# Patient Record
Sex: Female | Born: 1985 | Race: Black or African American | Hispanic: No | Marital: Single | Smoking: Never smoker
Health system: Southern US, Community
[De-identification: ages and names within clinical notes are randomized; demographics above are authoritative.]

## PROBLEM LIST (undated history)

## (undated) DIAGNOSIS — E0590 Thyrotoxicosis, unspecified without thyrotoxic crisis or storm: Secondary | ICD-10-CM

## (undated) DIAGNOSIS — T4145XA Adverse effect of unspecified anesthetic, initial encounter: Secondary | ICD-10-CM

## (undated) DIAGNOSIS — E119 Type 2 diabetes mellitus without complications: Secondary | ICD-10-CM

## (undated) DIAGNOSIS — R7303 Prediabetes: Secondary | ICD-10-CM

## (undated) DIAGNOSIS — D649 Anemia, unspecified: Secondary | ICD-10-CM

## (undated) DIAGNOSIS — T8859XA Other complications of anesthesia, initial encounter: Secondary | ICD-10-CM

## (undated) DIAGNOSIS — K219 Gastro-esophageal reflux disease without esophagitis: Secondary | ICD-10-CM

## (undated) DIAGNOSIS — R87619 Unspecified abnormal cytological findings in specimens from cervix uteri: Secondary | ICD-10-CM

## (undated) DIAGNOSIS — F419 Anxiety disorder, unspecified: Secondary | ICD-10-CM

## (undated) HISTORY — DX: Prediabetes: R73.03

## (undated) HISTORY — PX: TUBAL LIGATION: SHX77

## (undated) HISTORY — PX: CHOLECYSTECTOMY: SHX55

## (undated) HISTORY — PX: ROUX-EN-Y GASTRIC BYPASS: SHX1104

## (undated) HISTORY — PX: TONSILLECTOMY: SUR1361

## (undated) HISTORY — PX: BARIATRIC SURGERY: SHX1103

## (undated) HISTORY — PX: UPPER GASTROINTESTINAL ENDOSCOPY: SHX188

## (undated) HISTORY — DX: Unspecified abnormal cytological findings in specimens from cervix uteri: R87.619

## (undated) HISTORY — PX: PANNICULECTOMY: SUR1001

---

## 2015-04-07 ENCOUNTER — Ambulatory Visit: Payer: Self-pay | Admitting: Family Medicine

## 2015-04-19 ENCOUNTER — Ambulatory Visit: Payer: Self-pay | Admitting: Family Medicine

## 2015-04-28 ENCOUNTER — Ambulatory Visit: Payer: Self-pay | Admitting: Family Medicine

## 2015-09-04 ENCOUNTER — Ambulatory Visit: Payer: Self-pay | Admitting: Family Medicine

## 2016-04-09 ENCOUNTER — Encounter: Payer: Self-pay | Admitting: *Deleted

## 2016-04-10 ENCOUNTER — Ambulatory Visit
Admission: RE | Admit: 2016-04-10 | Payer: Medicaid Other | Source: Ambulatory Visit | Admitting: Unknown Physician Specialty

## 2016-04-10 ENCOUNTER — Encounter: Admission: RE | Payer: Self-pay | Source: Ambulatory Visit

## 2016-04-10 HISTORY — DX: Anxiety disorder, unspecified: F41.9

## 2016-04-10 HISTORY — DX: Gastro-esophageal reflux disease without esophagitis: K21.9

## 2016-04-10 HISTORY — DX: Type 2 diabetes mellitus without complications: E11.9

## 2016-04-10 SURGERY — ESOPHAGOGASTRODUODENOSCOPY (EGD) WITH PROPOFOL
Anesthesia: General

## 2016-05-01 DIAGNOSIS — R87619 Unspecified abnormal cytological findings in specimens from cervix uteri: Secondary | ICD-10-CM

## 2016-05-01 HISTORY — DX: Unspecified abnormal cytological findings in specimens from cervix uteri: R87.619

## 2016-05-01 LAB — HM PAP SMEAR

## 2016-06-18 HISTORY — PX: COLPOSCOPY: SHX161

## 2016-06-19 DIAGNOSIS — Z87410 Personal history of cervical dysplasia: Secondary | ICD-10-CM | POA: Insufficient documentation

## 2016-08-05 DIAGNOSIS — R7303 Prediabetes: Secondary | ICD-10-CM | POA: Insufficient documentation

## 2016-08-05 DIAGNOSIS — K219 Gastro-esophageal reflux disease without esophagitis: Secondary | ICD-10-CM | POA: Insufficient documentation

## 2016-08-05 DIAGNOSIS — R030 Elevated blood-pressure reading, without diagnosis of hypertension: Secondary | ICD-10-CM | POA: Insufficient documentation

## 2016-08-28 ENCOUNTER — Ambulatory Visit
Admission: RE | Admit: 2016-08-28 | Payer: Medicaid Other | Source: Ambulatory Visit | Admitting: Unknown Physician Specialty

## 2016-08-28 ENCOUNTER — Encounter: Admission: RE | Payer: Self-pay | Source: Ambulatory Visit

## 2016-08-28 SURGERY — ESOPHAGOGASTRODUODENOSCOPY (EGD) WITH PROPOFOL
Anesthesia: General

## 2016-09-05 DIAGNOSIS — D509 Iron deficiency anemia, unspecified: Secondary | ICD-10-CM | POA: Insufficient documentation

## 2017-01-09 ENCOUNTER — Ambulatory Visit (INDEPENDENT_AMBULATORY_CARE_PROVIDER_SITE_OTHER): Payer: Medicaid Other

## 2017-01-09 DIAGNOSIS — Z3042 Encounter for surveillance of injectable contraceptive: Secondary | ICD-10-CM

## 2017-01-09 DIAGNOSIS — Z308 Encounter for other contraceptive management: Secondary | ICD-10-CM

## 2017-01-09 MED ORDER — MEDROXYPROGESTERONE ACETATE 150 MG/ML IM SUSP
150.0000 mg | Freq: Once | INTRAMUSCULAR | Status: AC
  Administered 2017-01-09: 150 mg via INTRAMUSCULAR

## 2017-01-22 ENCOUNTER — Encounter: Payer: Self-pay | Admitting: Obstetrics & Gynecology

## 2017-01-28 ENCOUNTER — Ambulatory Visit: Payer: Medicaid Other | Admitting: Obstetrics & Gynecology

## 2017-01-29 DIAGNOSIS — N870 Mild cervical dysplasia: Secondary | ICD-10-CM

## 2017-01-30 ENCOUNTER — Ambulatory Visit (INDEPENDENT_AMBULATORY_CARE_PROVIDER_SITE_OTHER): Payer: Medicaid Other | Admitting: Obstetrics and Gynecology

## 2017-01-30 ENCOUNTER — Encounter: Payer: Self-pay | Admitting: Obstetrics and Gynecology

## 2017-01-30 VITALS — BP 120/70 | HR 86 | Ht 67.0 in | Wt 255.0 lb

## 2017-01-30 DIAGNOSIS — N938 Other specified abnormal uterine and vaginal bleeding: Secondary | ICD-10-CM

## 2017-01-30 DIAGNOSIS — R5383 Other fatigue: Secondary | ICD-10-CM | POA: Diagnosis not present

## 2017-01-30 DIAGNOSIS — R87612 Low grade squamous intraepithelial lesion on cytologic smear of cervix (LGSIL): Secondary | ICD-10-CM

## 2017-01-30 DIAGNOSIS — R202 Paresthesia of skin: Secondary | ICD-10-CM | POA: Diagnosis not present

## 2017-01-30 DIAGNOSIS — N921 Excessive and frequent menstruation with irregular cycle: Secondary | ICD-10-CM

## 2017-01-30 DIAGNOSIS — Z0000 Encounter for general adult medical examination without abnormal findings: Secondary | ICD-10-CM | POA: Diagnosis not present

## 2017-01-30 NOTE — Progress Notes (Signed)
  Chief Complaint  Patient presents with  . aub     HPI:      Crystal Mendez is a 31 y.o. [phone] who LMP was No LMP recorded., presents today for DUB/menometrorrhagia sx. She is a pt of Dr. Tiburcio Pea' but he isn't in the office.  She has a long hx of menometrorrhagia and BTB since her BTL years ago. She has been seeing Dr. Tiburcio Pea since 8/17. 11/17 U/S suggestive of adenomyosis per Dr. Tiburcio Pea note. Pt started on depo. She has had 2 shots. She complains of regular BTB or light bleeding, even on depo, as well as cramping, increased chest hair. She is very tired of bleeding and is interested in a hysterectomy. She does not want an IUD.   She also complains of tingling in her legs and being very cold at night. She has not had recent labs.   Past Medical History:  Diagnosis Date  . Abnormal Pap smear of cervix 05/01/2016  . Anxiety   . Asthma   . Diabetes mellitus without complication (HCC)   . GERD (gastroesophageal reflux disease)     Past Surgical History:  Procedure Laterality Date  . CESAREAN SECTION    . CHOLECYSTECTOMY    . COLPOSCOPY  06/18/2016  . TONSILLECTOMY    . TUBAL LIGATION    . TUBAL LIGATION      No family history on file.   ROS:  Review of Systems  Constitutional: Negative for fever, malaise/fatigue and weight loss.  Gastrointestinal: Negative for blood in stool, constipation, diarrhea, nausea and vomiting.  Genitourinary: Negative for dysuria, flank pain, frequency, hematuria and urgency.  Musculoskeletal: Positive for joint pain. Negative for back pain.  Skin: Negative for itching and rash.  Neurological: Positive for tingling and headaches.    Objective: BP 120/70   Pulse 86   Ht  (1.702 m)   Wt 255 lb (115.7 kg)   BMI 39.94 kg/m    OBGyn Exam   DEFERRED SINCE DONE 11/17.    Assessment/Plan:  Menometrorrhagia - U/S suggestive of adenomyosis 2017. Pt on depo but with BTB. Unhappy with sx. Doesn't want IUD. F/u with Dr. Tiburcio Pea to  discuss hysterectomy.  - Plan: Comprehensive metabolic panel, TSH + free T4, CBC  DUB (dysfunctional uterine bleeding) - Persists on depo. Pt has only had 2 shots and discussed that this is normal, but pt tired of it regardless.  Fatigue, unspecified type - Plan: TSH + free T4  Paresthesia of bilateral legs - Plan: B12 and Folate Panel  Blood tests for routine general physical examination - Check labs. Will f/u with results.  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 Pt due for repeat pap with Dr. Tiburcio Pea. Discussed pros/cons of supracx hyst and pt to discuss with [facility].    F/U  Return in about 1 week (around 02/06/2017) for with Dr. Tiburcio Pea for hyst conf.  Emilina Smarr B. Sokhna Christoph, PA-C 01/30/2017 10:45 AM

## 2017-01-31 LAB — COMPREHENSIVE METABOLIC PANEL
A/G RATIO: 1.3 (ref 1.2–2.2)
ALBUMIN: 4 g/dL (ref 3.5–5.5)
ALK PHOS: 72 IU/L (ref 39–117)
ALT: 13 IU/L (ref 0–32)
AST: 13 IU/L (ref 0–40)
BILIRUBIN TOTAL: 0.2 mg/dL (ref 0.0–1.2)
BUN/Creatinine Ratio: 13 (ref 9–23)
BUN: 8 mg/dL (ref 6–20)
CHLORIDE: 104 mmol/L (ref 96–106)
CO2: 20 mmol/L (ref 18–29)
Calcium: 9.5 mg/dL (ref 8.7–10.2)
Creatinine, Ser: 0.6 mg/dL (ref 0.57–1.00)
GFR calc non Af Amer: 124 mL/min/{1.73_m2} (ref >59)
GFR, EST AFRICAN AMERICAN: 142 mL/min/{1.73_m2} (ref >59)
GLUCOSE: 116 mg/dL — AB (ref 65–99)
Globulin, Total: 3.2 g/dL (ref 1.5–4.5)
Potassium: 4.3 mmol/L (ref 3.5–5.2)
SODIUM: 143 mmol/L (ref 134–144)
TOTAL PROTEIN: 7.2 g/dL (ref 6.0–8.5)

## 2017-01-31 LAB — CBC
HEMOGLOBIN: 11 g/dL — AB (ref 11.1–15.9)
Hematocrit: 36.1 % (ref 34.0–46.6)
MCH: 23.1 pg — ABNORMAL LOW (ref 26.6–33.0)
MCHC: 30.5 g/dL — ABNORMAL LOW (ref 31.5–35.7)
MCV: 76 fL — AB (ref 79–97)
PLATELETS: 440 10*3/uL — AB (ref 150–379)
RBC: 4.77 x10E6/uL (ref 3.77–5.28)
RDW: 16.5 % — ABNORMAL HIGH (ref 12.3–15.4)
WBC: 7.7 10*3/uL (ref 3.4–10.8)

## 2017-01-31 LAB — B12 AND FOLATE PANEL
FOLATE: 13.3 ng/mL (ref >3.0)
Vitamin B-12: 259 pg/mL (ref 232–1245)

## 2017-01-31 LAB — HEMOGLOBIN A1C
Est. average glucose Bld gHb Est-mCnc: 128 mg/dL
Hgb A1c MFr Bld: 6.1 % — ABNORMAL HIGH (ref 4.8–5.6)

## 2017-01-31 LAB — TSH+FREE T4
Free T4: 1.41 ng/dL (ref 0.82–1.77)
TSH: 0.203 u[IU]/mL — AB (ref 0.450–4.500)

## 2017-02-03 NOTE — Progress Notes (Signed)
I routed labs to you. Can you print them and mail to pt? Just want to make sure you get it. Thx.

## 2017-02-21 ENCOUNTER — Ambulatory Visit: Payer: Medicaid Other | Admitting: Obstetrics & Gynecology

## 2017-02-24 ENCOUNTER — Ambulatory Visit: Payer: Medicaid Other | Admitting: Obstetrics & Gynecology

## 2017-03-03 ENCOUNTER — Encounter: Payer: Self-pay | Admitting: Obstetrics & Gynecology

## 2017-03-03 ENCOUNTER — Ambulatory Visit (INDEPENDENT_AMBULATORY_CARE_PROVIDER_SITE_OTHER): Payer: Medicaid Other | Admitting: Obstetrics & Gynecology

## 2017-03-03 VITALS — BP 130/80 | HR 93 | Ht 67.0 in | Wt 256.0 lb

## 2017-03-03 DIAGNOSIS — E119 Type 2 diabetes mellitus without complications: Secondary | ICD-10-CM

## 2017-03-03 DIAGNOSIS — Z87410 Personal history of cervical dysplasia: Secondary | ICD-10-CM

## 2017-03-03 DIAGNOSIS — N921 Excessive and frequent menstruation with irregular cycle: Secondary | ICD-10-CM

## 2017-03-03 NOTE — Patient Instructions (Signed)
Total Laparoscopic Hysterectomy °A total laparoscopic hysterectomy is a minimally invasive surgery to remove your uterus and cervix. This surgery is performed by making several small cuts (incisions) in your abdomen. It can also be done with a thin, lighted tube (laparoscope) inserted into two small incisions in your lower abdomen. Your fallopian tubes and ovaries can be removed (bilateral salpingo-oophorectomy) during this surgery as well. Benefits of minimally invasive surgery include: °· Less pain. °· Less risk of blood loss. °· Less risk of infection. °· Quicker return to normal activities. ° °Tell a health care provider about: °· Any allergies you have. °· All medicines you are taking, including vitamins, herbs, eye drops, creams, and over-the-counter medicines. °· Any problems you or family members have had with anesthetic medicines. °· Any blood disorders you have. °· Any surgeries you have had. °· Any medical conditions you have. °What are the risks? °Generally, this is a safe procedure. However, as with any procedure, complications can occur. Possible complications include: °· Bleeding. °· Blood clots in the legs or lung. °· Infection. °· Injury to surrounding organs. °· Problems with anesthesia. °· Early menopause symptoms (hot flashes, night sweats, insomnia). °· Risk of conversion to an open abdominal incision. ° °What happens before the procedure? °· Ask your health care provider about changing or stopping your regular medicines. °· Do not take aspirin or blood thinners (anticoagulants) for 1 week before the surgery or as told by your health care provider. °· Do not eat or drink anything for 8 hours before the surgery or as told by your health care provider. °· Quit smoking if you smoke. °· Arrange for a ride home after surgery and for someone to help you at home during recovery. °What happens during the procedure? °· You will be given antibiotic medicine. °· An IV tube will be placed in your arm. You  will be given medicine to make you sleep (general anesthetic). °· A gas (carbon dioxide) will be used to inflate your abdomen. This will allow your surgeon to look inside your abdomen, perform your surgery, and treat any other problems found if necessary. °· Three or four small incisions (often less than 1/2 inch) will be made in your abdomen. One of these incisions will be made in the area of your belly button (navel). The laparoscope will be inserted into the incision. Your surgeon will look through the laparoscope while doing your procedure. °· Other surgical instruments will be inserted through the other incisions. °· Your uterus may be removed through your vagina or cut into small pieces and removed through the small incisions. °· Your incisions will be closed. °What happens after the procedure? °· The gas will be released from inside your abdomen. °· You will be taken to the recovery area where a nurse will watch and check your progress. Once you are awake, stable, and taking fluids well, without other problems, you will return to your room or be allowed to go home. °· There is usually minimal discomfort following the surgery because the incisions are so small. °· You will be given pain medicine while you are in the hospital and for when you go home. °This information is not intended to replace advice given to you by your health care provider. Make sure you discuss any questions you have with your health care provider. °Document Released: 08/04/2007 Document Revised: 03/14/2016 Document Reviewed: 04/27/2013 °Elsevier Interactive Patient Education © 2017 [employer] ° °

## 2017-03-03 NOTE — Progress Notes (Signed)
HPI:  Patient is a 31 y.o. [id number] presenting for follow up evaluation of abnormal PAP smear in the past.  Her last PAP was 7 months ago and was abnormal: LGSIL. She has had a prior colposcopy. Prior biopsies (if done) were CIN I.  Also c/o continues abnormal bleeding, with some blood or brown discharge most days each month, despite Depo therapy for the past 7+ mos.  Prior US with adenomyosis appearance.  Prior BTL, does not want future fertility.  PMHx: She  has a past medical history of Abnormal Pap smear of cervix (05/01/2016); Anxiety; Asthma; Diabetes mellitus without complication (HCC); and GERD (gastroesophageal reflux disease). Also,  has a past surgical history that includes Cholecystectomy; Tubal ligation; Tonsillectomy; Tubal ligation; Cesarean section; and Colposcopy (06/18/2016)., family history is not on file.,  reports that she has never smoked. She has never used smokeless tobacco. She reports that she does not drink alcohol or use drugs.  She has a current medication list which includes the following prescription(s): azithromycin, chlorophyll, medroxyprogesterone acetate, multivitamin, ranitidine, and tamiflu. Also, is allergic to eggs or egg-derived products and lactose. Review of Systems  Constitutional: Negative for chills, fever and malaise/fatigue.  HENT: Negative for congestion, sinus pain and sore throat.   Eyes: Negative for blurred vision and pain.  Respiratory: Negative for cough and wheezing.   Cardiovascular: Negative for chest pain and leg swelling.  Gastrointestinal: Negative for abdominal pain, constipation, diarrhea, heartburn, nausea and vomiting.  Genitourinary: Negative for dysuria, frequency, hematuria and urgency.  Musculoskeletal: Negative for back pain, joint pain, myalgias and neck pain.  Skin: Negative for itching and rash.  Neurological: Negative for dizziness, tremors and weakness.  Endo/Heme/Allergies: Does not bruise/bleed easily.    Psychiatric/Behavioral: Negative for depression. The patient is not nervous/anxious and does not have insomnia.     Objective: BP 130/80   Pulse 93   Ht 5\' 7"  (1.702 m)   Wt 256 lb (116.1 kg)   BMI 40.10 kg/m  Filed Weights   03/03/17 1109  Weight: 256 lb (116.1 kg)   Body mass index is 40.1 kg/m.  Physical examination Physical Exam  Constitutional: She is oriented to person, place, and time. She appears well-developed and well-nourished. No distress.  Genitourinary: Vagina normal. Pelvic exam was performed with patient supine. There is no rash, tenderness or lesion on the right labia. There is no rash, tenderness or lesion on the left labia. No erythema or bleeding in the vagina. Right adnexum does not display mass and does not display tenderness. Left adnexum does not display mass and does not display tenderness. Cervix does not exhibit motion tenderness, discharge, polyp or nabothian cyst.   Uterus is enlarged, mobile and midaxial. Uterus is not exhibiting a mass.  Abdominal: Soft. She exhibits no distension. There is no tenderness.  Musculoskeletal: Normal range of motion.  Neurological: She is alert and oriented to person, place, and time. No cranial nerve deficit.  Skin: Skin is warm and dry.  Psychiatric: She has a normal mood and affect.    ASSESSMENT:  History of Cervical Dysplasia, Menometrorrhagia, Adenomyosis Also, obesity, diabetes.  Plan:  1.  I discussed the grading system of pap smears and HPV high risk viral types.   2. Follow up PAP 6 months, vs intervention if high grade dysplasia identified. 3. Treatment of persistantly abnormal PAP smears and cervical dysplasia, even mild, is discussed w pt today in detail, as well as the pros and cons of Cryo and LEEP procedures. Will consider and  discuss after results. 4. Plan hysterectomy per pt choice after all options for menometrorrhagia tx discussed.  Prior CS x4 as risk discussed.  Obesity risk factor  discussed. Patient has abnormal uterine bleeding . Treatment option for menorrhagia or menometrorrhagia discussed in great detail with the patient.  Options include hormonal therapy, IUD therapy such as Mirena, D&C, Ablation, and Hysterectomy.  The pros and cons of each option discussed with patient. Prefers hysterectomy and declines IUD, continued Depo, pill, ablation.  Annamarie MajorPaul Bernise Sylvain, MD, Merlinda FrederickFACOG [facility], [facility] 03/03/2017  11:41 AM

## 2017-03-04 ENCOUNTER — Ambulatory Visit: Payer: Self-pay | Admitting: Nurse Practitioner

## 2017-03-05 LAB — IGP, RFX APTIMA HPV ASCU: PAP Smear Comment: 0

## 2017-03-10 ENCOUNTER — Telehealth: Payer: Self-pay | Admitting: Obstetrics & Gynecology

## 2017-03-10 NOTE — Telephone Encounter (Signed)
Patient left a message on my v/m requesting pap results.

## 2017-03-11 NOTE — Telephone Encounter (Signed)
PAP is normal and this is great news!  Follow up next year. Let her know.

## 2017-03-11 NOTE — Telephone Encounter (Signed)
Pt aware.

## 2017-03-11 NOTE — Telephone Encounter (Signed)
Left message to return call 

## 2017-03-14 ENCOUNTER — Inpatient Hospital Stay: Admission: RE | Admit: 2017-03-14 | Payer: Self-pay | Source: Ambulatory Visit

## 2017-03-14 ENCOUNTER — Encounter: Payer: Medicaid Other | Admitting: Obstetrics & Gynecology

## 2017-03-18 ENCOUNTER — Ambulatory Visit
Admission: RE | Admit: 2017-03-18 | Payer: Medicaid Other | Source: Ambulatory Visit | Admitting: Obstetrics & Gynecology

## 2017-03-18 ENCOUNTER — Encounter: Admission: RE | Payer: Self-pay | Source: Ambulatory Visit

## 2017-03-18 SURGERY — HYSTERECTOMY, TOTAL, LAPAROSCOPIC
Anesthesia: Choice

## 2017-03-26 ENCOUNTER — Telehealth: Payer: Self-pay | Admitting: Obstetrics & Gynecology

## 2017-03-26 DIAGNOSIS — Z131 Encounter for screening for diabetes mellitus: Secondary | ICD-10-CM

## 2017-03-26 DIAGNOSIS — Z1329 Encounter for screening for other suspected endocrine disorder: Secondary | ICD-10-CM

## 2017-03-26 NOTE — Telephone Encounter (Signed)
Pt is calling about needing an appointment for labs to be drawn for possible hyperthyroidism and per diabetic labs. It was something that was discuss at her last visit.Please advise

## 2017-03-26 NOTE — Telephone Encounter (Signed)
Can you put lab order in

## 2017-03-26 NOTE — Telephone Encounter (Signed)
Pt aware.

## 2017-03-26 NOTE — Telephone Encounter (Signed)
Labs ordered for her to sch and come by

## 2017-03-26 NOTE — Telephone Encounter (Signed)
I did already I thought

## 2017-03-27 ENCOUNTER — Other Ambulatory Visit: Payer: Medicaid Other

## 2017-03-27 DIAGNOSIS — Z131 Encounter for screening for diabetes mellitus: Secondary | ICD-10-CM

## 2017-03-27 DIAGNOSIS — Z1329 Encounter for screening for other suspected endocrine disorder: Secondary | ICD-10-CM

## 2017-03-28 LAB — TSH: TSH: 0.154 u[IU]/mL — ABNORMAL LOW (ref 0.450–4.500)

## 2017-03-28 LAB — HEMOGLOBIN A1C
ESTIMATED AVERAGE GLUCOSE: 117 mg/dL
Hgb A1c MFr Bld: 5.7 % — ABNORMAL HIGH (ref 4.8–5.6)

## 2017-03-31 NOTE — Progress Notes (Signed)
LMTRC

## 2017-03-31 NOTE — Progress Notes (Signed)
Pt aware of labs. Hasn't made appt with PCP yet (due to insurance card), but plans to. Has been doing diet changes for wt loss, and HgA1C better. Pt needs to f/u with PCP for thyroid. Will add free T4. Pt to activate MyChart so I can send her copy of her labs.

## 2017-03-31 NOTE — Progress Notes (Signed)
Do you want to address these or me?

## 2017-04-01 ENCOUNTER — Ambulatory Visit (INDEPENDENT_AMBULATORY_CARE_PROVIDER_SITE_OTHER): Payer: Medicaid Other

## 2017-04-01 ENCOUNTER — Ambulatory Visit: Payer: Medicaid Other

## 2017-04-01 DIAGNOSIS — Z3042 Encounter for surveillance of injectable contraceptive: Secondary | ICD-10-CM | POA: Diagnosis not present

## 2017-04-01 LAB — SPECIMEN STATUS REPORT

## 2017-04-01 LAB — T4, FREE: Free T4: 1.23 ng/dL (ref 0.82–1.77)

## 2017-04-01 MED ORDER — MEDROXYPROGESTERONE ACETATE 150 MG/ML IM SUSP
150.0000 mg | Freq: Once | INTRAMUSCULAR | Status: AC
  Administered 2017-04-01: 150 mg via INTRAMUSCULAR

## 2017-04-10 ENCOUNTER — Encounter: Payer: Self-pay | Admitting: Obstetrics & Gynecology

## 2017-04-10 ENCOUNTER — Ambulatory Visit (INDEPENDENT_AMBULATORY_CARE_PROVIDER_SITE_OTHER): Payer: Medicaid Other | Admitting: Obstetrics & Gynecology

## 2017-04-10 VITALS — BP 120/80 | HR 103 | Ht 67.0 in | Wt 253.0 lb

## 2017-04-10 DIAGNOSIS — N800 Endometriosis of uterus: Secondary | ICD-10-CM

## 2017-04-10 DIAGNOSIS — N921 Excessive and frequent menstruation with irregular cycle: Secondary | ICD-10-CM

## 2017-04-10 DIAGNOSIS — N809 Endometriosis, unspecified: Secondary | ICD-10-CM

## 2017-04-10 DIAGNOSIS — N8003 Adenomyosis of the uterus: Secondary | ICD-10-CM

## 2017-04-10 DIAGNOSIS — N870 Mild cervical dysplasia: Secondary | ICD-10-CM

## 2017-04-10 MED ORDER — METHYLENE BLUE 0.5 % INJ SOLN
INTRAVENOUS | Status: AC
  Filled 2017-04-10: qty 10

## 2017-04-10 NOTE — Patient Instructions (Signed)

## 2017-04-10 NOTE — Progress Notes (Signed)
PRE-OPERATIVE HISTORY AND PHYSICAL EXAM  HPI:  Crystal Mendez is a [age] y.o. [id number] No LMP recorded.; she is being admitted for surgery related to abnormal uterine bleeding and cervical dysplasia (recurrent, mild).  She suffers from heavy abnormal bleeding, with some blood or brown discharge most days each month, despite Depo therapy for the past 7+ mos.  Prior US with adenomyosis appearance.  Prior BTL, does not want future fertility.Marland Kitchen.  PMHx: Past Medical History:  Diagnosis Date  . Abnormal Pap smear of cervix 05/01/2016  . Anxiety   . Asthma   . Diabetes mellitus without complication (HCC)   . GERD (gastroesophageal reflux disease)    Past Surgical History:  Procedure Laterality Date  . CESAREAN SECTION    . CHOLECYSTECTOMY    . COLPOSCOPY  06/18/2016  . TONSILLECTOMY    . TUBAL LIGATION    . TUBAL LIGATION     History reviewed. No pertinent family history. Social History  Substance Use Topics  . Smoking status: Never Smoker  . Smokeless tobacco: Never Used  . Alcohol use No   Meds- Probiotics, Provera Allergies: Eggs or egg-derived products and Lactose  Review of Systems  Constitutional: Negative for chills, fever and malaise/fatigue.  HENT: Negative for congestion, sinus pain and sore throat.   Eyes: Negative for blurred vision and pain.  Respiratory: Negative for cough and wheezing.   Cardiovascular: Negative for chest pain and leg swelling.  Gastrointestinal: Negative for abdominal pain, constipation, diarrhea, heartburn, nausea and vomiting.  Genitourinary: Negative for dysuria, frequency, hematuria and urgency.  Musculoskeletal: Negative for back pain, joint pain, myalgias and neck pain.  Skin: Negative for itching and rash.  Neurological: Negative for dizziness, tremors and weakness.  Endo/Heme/Allergies: Does not bruise/bleed easily.  Psychiatric/Behavioral: Negative for depression. The patient is not nervous/anxious and does not have insomnia.      Objective: BP 120/80   Pulse (!) 103   Ht 5\' 7"  (1.702 m)   Wt 253 lb (114.8 kg)   BMI 39.63 kg/m   Filed Weights   04/10/17 1632  Weight: 253 lb (114.8 kg)   Physical Exam  Constitutional: She is oriented to person, place, and time. She appears well-developed and well-nourished. No distress.  Genitourinary: Rectum normal, vagina normal and uterus normal. Pelvic exam was performed with patient supine. There is no rash or lesion on the right labia. There is no rash or lesion on the left labia. Vagina exhibits no lesion. No bleeding in the vagina. Right adnexum does not display mass and does not display tenderness. Left adnexum does not display mass and does not display tenderness. Cervix does not exhibit motion tenderness, lesion, friability or polyp.   Uterus is mobile and midaxial. Uterus is not enlarged or exhibiting a mass.  HENT:  Head: Normocephalic and atraumatic. Head is without laceration.  Right Ear: Hearing normal.  Left Ear: Hearing normal.  Nose: No epistaxis.  No foreign bodies.  Mouth/Throat: Uvula is midline, oropharynx is clear and moist and mucous membranes are normal.  Eyes: Pupils are equal, round, and reactive to light.  Neck: Normal range of motion. Neck supple. No thyromegaly present.  Cardiovascular: Normal rate and regular rhythm.  Exam reveals no gallop and no friction rub.   No murmur heard. Pulmonary/Chest: Effort normal and breath sounds normal. No respiratory distress. She has no wheezes. Right breast exhibits no mass, no skin change and no tenderness. Left breast exhibits no mass, no skin change and no tenderness.  Abdominal: Soft. Bowel sounds are normal. She exhibits no distension. There is no tenderness. There is no rebound.  Musculoskeletal: Normal range of motion.  Neurological: She is alert and oriented to person, place, and time. No cranial nerve deficit.  Skin: Skin is warm and dry.  Psychiatric: She has a normal mood and affect. Judgment  normal.  Vitals reviewed.   Assessment: 1. Dysplasia of cervix, low grade (CIN 1)   2. Menometrorrhagia   3. Adenomyosis   Desires and plans for TLH, BS, Cysto.  Preservation of ovaries discussed.  I have had a careful discussion with this patient about all the options available and the risk/benefits of each. I have fully informed this patient that surgery may subject her to a variety of discomforts and risks: She understands that most patients have surgery with little difficulty, but problems can happen ranging from minor to fatal. These include nausea, vomiting, pain, bleeding, infection, poor healing, hernia, or formation of adhesions. Unexpected reactions may occur from any drug or anesthetic given. Unintended injury may occur to other pelvic or abdominal structures such as Fallopian tubes, ovaries, bladder, ureter (tube from kidney to bladder), or bowel. Nerves going from the pelvis to the legs may be injured. Any such injury may require immediate or later additional surgery to correct the problem. Excessive blood loss requiring transfusion is very unlikely but possible. Dangerous blood clots may form in the legs or lungs. Physical and sexual activity will be restricted in varying degrees for an indeterminate period of time but most often 2-6 weeks.  Finally, she understands that it is impossible to list every possible undesirable effect and that the condition for which surgery is done is not always cured or significantly improved, and in rare cases may be even worse.Ample time was given to answer all questions.  Annamarie Major, MD, [facility] Frederick [facility], [facility] 04/10/2017  4:37 PM

## 2017-04-11 ENCOUNTER — Encounter
Admission: RE | Admit: 2017-04-11 | Discharge: 2017-04-11 | Disposition: A | Discharge status: Home / Self Care | Payer: Medicaid Other | Source: Ambulatory Visit | Attending: Obstetrics & Gynecology | Admitting: Obstetrics & Gynecology

## 2017-04-11 ENCOUNTER — Encounter: Payer: Self-pay | Admitting: *Deleted

## 2017-04-11 HISTORY — DX: Thyrotoxicosis, unspecified without thyrotoxic crisis or storm: E05.90

## 2017-04-11 HISTORY — DX: Adverse effect of unspecified anesthetic, initial encounter: T41.45XA

## 2017-04-11 HISTORY — DX: Anemia, unspecified: D64.9

## 2017-04-11 HISTORY — DX: Other complications of anesthesia, initial encounter: T88.59XA

## 2017-04-11 NOTE — Patient Instructions (Signed)
  Your procedure is scheduled on: 04-15-17 TUESDAY Report to Same Day Surgery 2nd floor medical mall [facility](Medical Mall Entrance-take elevator on left to 2nd floor.  Check in with surgery information desk.) To find out your arrival time please call [phone] between 1PM - 3PM on 04-14-17 MONDAY  Remember: Instructions that are not followed completely may result in serious medical risk, up to and including death, or upon the discretion of your surgeon and anesthesiologist your surgery may need to be rescheduled.    _x___ 1. Do not eat food or drink liquids after midnight. No gum chewing or hard candies.     __x__ 2. No Alcohol for 24 hours before or after surgery.   __x__3. No Smoking for 24 prior to surgery.   ____  4. Bring all medications with you on the day of surgery if instructed.    __x__ 5. Notify your doctor if there is any change in your medical condition     (cold, fever, infections).     Do not wear jewelry, make-up, hairpins, clips or nail polish.  Do not wear lotions, powders, or perfumes. You may wear deodorant.  Do not shave 48 hours prior to surgery. Men may shave face and neck.  Do not bring valuables to the hospital.    [facility] for any belongings or valuables.               Contacts, dentures or bridgework may not be worn into surgery.  Leave your suitcase in the car. After surgery it may be brought to your room.  For patients admitted to the hospital, discharge time is determined by your treatment team.   Patients discharged the day of surgery will not be allowed to drive home.  You will need someone to drive you home and stay with you the night of your procedure.    Please read over the following fact sheets that you were given:   [facility] Preparing [facility] and or MRSA Information   ____ Take anti-hypertensive (unless it includes a diuretic), cardiac, seizure, asthma,     anti-reflux and psychiatric medicines. These include:  1.  NONE  2.  3.  4.  5.  6.  ____Fleets enema or Magnesium Citrate as directed.   _x___ Use CHG Soap or sage wipes as directed on instruction sheet   ____ Use inhalers on the day of surgery and bring to hospital day of surgery  ____ Stop Metformin and Janumet 2 days prior to surgery.    ____ Take 1/2 of usual insulin dose the night before surgery and none on the morning surgery.   ____ Follow recommendations from Cardiologist, Pulmonologist or PCP regarding stopping Aspirin, Coumadin, Pllavix ,Eliquis, Effient, or Pradaxa, and Pletal.  X____Stop Anti-inflammatories such as Advil, Aleve, Ibuprofen, Motrin, Naproxen, Naprosyn, Goodies powders or aspirin products NOW-OK to take Tylenol    ____ Stop supplements until after surgery.     ____ Bring C-Pap to the hospital.

## 2017-04-14 ENCOUNTER — Encounter
Admission: RE | Admit: 2017-04-14 | Discharge: 2017-04-14 | Disposition: A | Discharge status: Home / Self Care | Payer: Medicaid Other | Source: Ambulatory Visit | Attending: Obstetrics & Gynecology | Admitting: Obstetrics & Gynecology

## 2017-04-14 DIAGNOSIS — N800 Endometriosis of uterus: Secondary | ICD-10-CM | POA: Diagnosis not present

## 2017-04-14 DIAGNOSIS — N921 Excessive and frequent menstruation with irregular cycle: Secondary | ICD-10-CM | POA: Insufficient documentation

## 2017-04-14 DIAGNOSIS — Z01812 Encounter for preprocedural laboratory examination: Secondary | ICD-10-CM | POA: Diagnosis present

## 2017-04-14 DIAGNOSIS — N820 Vesicovaginal fistula: Secondary | ICD-10-CM | POA: Diagnosis not present

## 2017-04-14 LAB — CBC
HCT: 36 % (ref 35.0–47.0)
HEMOGLOBIN: 11.3 g/dL — AB (ref 12.0–16.0)
MCH: 23.9 pg — ABNORMAL LOW (ref 26.0–34.0)
MCHC: 31.6 g/dL — AB (ref 32.0–36.0)
MCV: 75.9 fL — ABNORMAL LOW (ref 80.0–100.0)
Platelets: 369 10*3/uL (ref 150–440)
RBC: 4.74 MIL/uL (ref 3.80–5.20)
RDW: 16.8 % — ABNORMAL HIGH (ref 11.5–14.5)
WBC: 6.2 10*3/uL (ref 3.6–11.0)

## 2017-04-14 MED ORDER — CEFOXITIN SODIUM-DEXTROSE 2-2.2 GM-% IV SOLR (PREMIX)
2.0000 g | Freq: Once | INTRAVENOUS | Status: AC
  Administered 2017-04-15: 2 g via INTRAVENOUS

## 2017-04-15 ENCOUNTER — Ambulatory Visit: Payer: Medicaid Other | Admitting: Certified Registered Nurse Anesthetist

## 2017-04-15 ENCOUNTER — Ambulatory Visit
Admission: RE | Admit: 2017-04-15 | Discharge: 2017-04-15 | Disposition: A | Discharge status: Home / Self Care | Payer: Medicaid Other | Source: Ambulatory Visit | Attending: Obstetrics & Gynecology | Admitting: Obstetrics & Gynecology

## 2017-04-15 ENCOUNTER — Encounter
Admission: RE | Disposition: A | Discharge status: Home / Self Care | Payer: Self-pay | Source: Ambulatory Visit | Attending: Obstetrics & Gynecology

## 2017-04-15 ENCOUNTER — Encounter: Payer: Self-pay | Admitting: *Deleted

## 2017-04-15 DIAGNOSIS — I10 Essential (primary) hypertension: Secondary | ICD-10-CM | POA: Diagnosis not present

## 2017-04-15 DIAGNOSIS — N8003 Adenomyosis of the uterus: Secondary | ICD-10-CM | POA: Diagnosis present

## 2017-04-15 DIAGNOSIS — E119 Type 2 diabetes mellitus without complications: Secondary | ICD-10-CM | POA: Insufficient documentation

## 2017-04-15 DIAGNOSIS — E0590 Thyrotoxicosis, unspecified without thyrotoxic crisis or storm: Secondary | ICD-10-CM | POA: Insufficient documentation

## 2017-04-15 DIAGNOSIS — K219 Gastro-esophageal reflux disease without esophagitis: Secondary | ICD-10-CM | POA: Diagnosis not present

## 2017-04-15 DIAGNOSIS — Z87410 Personal history of cervical dysplasia: Secondary | ICD-10-CM | POA: Diagnosis present

## 2017-04-15 DIAGNOSIS — N809 Endometriosis, unspecified: Secondary | ICD-10-CM

## 2017-04-15 DIAGNOSIS — N800 Endometriosis of uterus: Secondary | ICD-10-CM | POA: Diagnosis not present

## 2017-04-15 DIAGNOSIS — N921 Excessive and frequent menstruation with irregular cycle: Secondary | ICD-10-CM | POA: Insufficient documentation

## 2017-04-15 DIAGNOSIS — R102 Pelvic and perineal pain: Secondary | ICD-10-CM | POA: Diagnosis present

## 2017-04-15 HISTORY — PX: LAPAROSCOPIC HYSTERECTOMY: SHX1926

## 2017-04-15 HISTORY — PX: CYSTOSCOPY: SHX5120

## 2017-04-15 LAB — TYPE AND SCREEN
ABO/RH(D): O POS
Antibody Screen: NEGATIVE

## 2017-04-15 LAB — POCT PREGNANCY, URINE: PREG TEST UR: NEGATIVE

## 2017-04-15 SURGERY — HYSTERECTOMY, TOTAL, LAPAROSCOPIC
Anesthesia: General

## 2017-04-15 MED ORDER — MIDAZOLAM HCL 2 MG/2ML IJ SOLN
INTRAMUSCULAR | Status: AC
  Filled 2017-04-15: qty 2

## 2017-04-15 MED ORDER — ONDANSETRON HCL 4 MG/2ML IJ SOLN
INTRAMUSCULAR | Status: AC
  Filled 2017-04-15: qty 2

## 2017-04-15 MED ORDER — PROPOFOL 10 MG/ML IV BOLUS
INTRAVENOUS | Status: DC | PRN
Start: 2017-04-15 — End: 2017-04-15
  Administered 2017-04-15: 200 mg via INTRAVENOUS

## 2017-04-15 MED ORDER — FAMOTIDINE 20 MG PO TABS
20.0000 mg | ORAL_TABLET | Freq: Once | ORAL | Status: AC
  Administered 2017-04-15: 20 mg via ORAL

## 2017-04-15 MED ORDER — OXYCODONE-ACETAMINOPHEN 5-325 MG PO TABS
ORAL_TABLET | ORAL | Status: AC
  Filled 2017-04-15: qty 1

## 2017-04-15 MED ORDER — ACETAMINOPHEN NICU IV SYRINGE 10 MG/ML
INTRAVENOUS | Status: AC
  Filled 2017-04-15: qty 1

## 2017-04-15 MED ORDER — KETOROLAC TROMETHAMINE 30 MG/ML IJ SOLN
INTRAMUSCULAR | Status: DC | PRN
  Administered 2017-04-15: 30 mg via INTRAVENOUS

## 2017-04-15 MED ORDER — FENTANYL CITRATE (PF) 100 MCG/2ML IJ SOLN
INTRAMUSCULAR | Status: AC
  Administered 2017-04-15: 25 ug via INTRAVENOUS
  Filled 2017-04-15: qty 2

## 2017-04-15 MED ORDER — ONDANSETRON HCL 4 MG/2ML IJ SOLN
INTRAMUSCULAR | Status: DC | PRN
  Administered 2017-04-15: 4 mg via INTRAVENOUS

## 2017-04-15 MED ORDER — ROCURONIUM BROMIDE 100 MG/10ML IV SOLN
INTRAVENOUS | Status: DC | PRN
  Administered 2017-04-15: 40 mg via INTRAVENOUS

## 2017-04-15 MED ORDER — OXYCODONE-ACETAMINOPHEN 5-325 MG PO TABS
1.0000 | ORAL_TABLET | ORAL | Status: DC | PRN
  Administered 2017-04-15: 1 via ORAL

## 2017-04-15 MED ORDER — FENTANYL CITRATE (PF) 100 MCG/2ML IJ SOLN
25.0000 ug | INTRAMUSCULAR | Status: AC | PRN
  Administered 2017-04-15 (×6): 25 ug via INTRAVENOUS

## 2017-04-15 MED ORDER — BUPIVACAINE HCL (PF) 0.5 % IJ SOLN
INTRAMUSCULAR | Status: DC | PRN
  Administered 2017-04-15: 17 mL

## 2017-04-15 MED ORDER — PHENYLEPHRINE HCL 10 MG/ML IJ SOLN
INTRAMUSCULAR | Status: DC | PRN
  Administered 2017-04-15 (×2): 200 ug via INTRAVENOUS
  Administered 2017-04-15: 100 ug via INTRAVENOUS
  Administered 2017-04-15: 200 ug via INTRAVENOUS
  Administered 2017-04-15: 100 ug via INTRAVENOUS
  Administered 2017-04-15: 200 ug via INTRAVENOUS

## 2017-04-15 MED ORDER — MORPHINE SULFATE (PF) 4 MG/ML IV SOLN: 1.0000 mg | INTRAVENOUS | Status: DC | PRN

## 2017-04-15 MED ORDER — ROCURONIUM BROMIDE 50 MG/5ML IV SOLN
INTRAVENOUS | Status: AC
  Filled 2017-04-15: qty 1

## 2017-04-15 MED ORDER — SUGAMMADEX SODIUM 500 MG/5ML IV SOLN
INTRAVENOUS | Status: AC
  Filled 2017-04-15: qty 5

## 2017-04-15 MED ORDER — MIDAZOLAM HCL 2 MG/2ML IJ SOLN
INTRAMUSCULAR | Status: DC | PRN
  Administered 2017-04-15: 2 mg via INTRAVENOUS

## 2017-04-15 MED ORDER — OXYCODONE-ACETAMINOPHEN 5-325 MG PO TABS
1.0000 | ORAL_TABLET | ORAL | 0 refills | Status: DC | PRN
Start: 2017-04-15 — End: 2017-11-18

## 2017-04-15 MED ORDER — LIDOCAINE HCL (CARDIAC) 20 MG/ML IV SOLN
INTRAVENOUS | Status: DC | PRN
  Administered 2017-04-15: 100 mg via INTRAVENOUS

## 2017-04-15 MED ORDER — ACETAMINOPHEN 10 MG/ML IV SOLN
INTRAVENOUS | Status: DC | PRN
  Administered 2017-04-15: 1000 mg via INTRAVENOUS

## 2017-04-15 MED ORDER — DEXAMETHASONE SODIUM PHOSPHATE 10 MG/ML IJ SOLN
INTRAMUSCULAR | Status: AC
  Filled 2017-04-15: qty 1

## 2017-04-15 MED ORDER — ONDANSETRON HCL 4 MG/2ML IJ SOLN
4.0000 mg | Freq: Once | INTRAMUSCULAR | Status: AC | PRN
  Administered 2017-04-15: 4 mg via INTRAVENOUS

## 2017-04-15 MED ORDER — KETOROLAC TROMETHAMINE 30 MG/ML IJ SOLN: 30.0000 mg | Freq: Four times a day (QID) | INTRAMUSCULAR | Status: DC

## 2017-04-15 MED ORDER — SUGAMMADEX SODIUM 200 MG/2ML IV SOLN
INTRAVENOUS | Status: DC | PRN
  Administered 2017-04-15: 275 mg via INTRAVENOUS

## 2017-04-15 MED ORDER — ACETAMINOPHEN 650 MG RE SUPP
650.0000 mg | RECTAL | Status: DC | PRN
  Filled 2017-04-15: qty 1

## 2017-04-15 MED ORDER — ACETAMINOPHEN 10 MG/ML IV SOLN: INTRAVENOUS | Status: DC | PRN

## 2017-04-15 MED ORDER — KETOROLAC TROMETHAMINE 30 MG/ML IJ SOLN
INTRAMUSCULAR | Status: AC
Start: 2017-04-15 — End: 2017-04-15
  Filled 2017-04-15: qty 1

## 2017-04-15 MED ORDER — FENTANYL CITRATE (PF) 100 MCG/2ML IJ SOLN
INTRAMUSCULAR | Status: DC | PRN
Start: 2017-04-15 — End: 2017-04-15
  Administered 2017-04-15: 100 ug via INTRAVENOUS

## 2017-04-15 MED ORDER — LACTATED RINGERS IV SOLN
INTRAVENOUS | Status: DC
  Administered 2017-04-15: 50 mL/h via INTRAVENOUS

## 2017-04-15 MED ORDER — PROPOFOL 10 MG/ML IV BOLUS
INTRAVENOUS | Status: AC
  Filled 2017-04-15: qty 20

## 2017-04-15 MED ORDER — FENTANYL CITRATE (PF) 100 MCG/2ML IJ SOLN
INTRAMUSCULAR | Status: AC
Start: 2017-04-15 — End: 2017-04-15
  Filled 2017-04-15: qty 2

## 2017-04-15 MED ORDER — DEXAMETHASONE SODIUM PHOSPHATE 10 MG/ML IJ SOLN
INTRAMUSCULAR | Status: DC | PRN
  Administered 2017-04-15: 5 mg via INTRAVENOUS

## 2017-04-15 MED ORDER — ACETAMINOPHEN 325 MG PO TABS: 650.0000 mg | ORAL_TABLET | ORAL | Status: DC | PRN

## 2017-04-15 MED ORDER — FAMOTIDINE 20 MG PO TABS
ORAL_TABLET | ORAL | Status: AC
  Administered 2017-04-15: 20 mg via ORAL
  Filled 2017-04-15: qty 1

## 2017-04-15 MED ORDER — SUCCINYLCHOLINE CHLORIDE 20 MG/ML IJ SOLN
INTRAMUSCULAR | Status: DC | PRN
  Administered 2017-04-15: 120 mg via INTRAVENOUS

## 2017-04-15 MED ORDER — CEFOXITIN SODIUM-DEXTROSE 2-2.2 GM-% IV SOLR (PREMIX)
INTRAVENOUS | Status: AC
  Filled 2017-04-15: qty 50

## 2017-04-15 MED ORDER — BUPIVACAINE HCL (PF) 0.5 % IJ SOLN
INTRAMUSCULAR | Status: AC
  Filled 2017-04-15: qty 30

## 2017-04-15 SURGICAL SUPPLY — 53 items
BAG URO DRAIN 2000ML W/SPOUT (MISCELLANEOUS) ×3 IMPLANT
BLADE SURG SZ11 CARB STEEL (BLADE) ×3 IMPLANT
CANISTER SUCT 1200ML W/VALVE (MISCELLANEOUS) ×3 IMPLANT
CATH FOLEY 2WAY  5CC 16FR (CATHETERS) ×2
CATH ROBINSON RED A/P 16FR (CATHETERS) ×3 IMPLANT
CATH URTH 16FR FL 2W BLN LF (CATHETERS) ×1 IMPLANT
CHLORAPREP W/TINT 26ML (MISCELLANEOUS) ×3 IMPLANT
DEFOGGER SCOPE WARMER CLEARIFY (MISCELLANEOUS) ×3 IMPLANT
DERMABOND ADVANCED (GAUZE/BANDAGES/DRESSINGS) ×2
DERMABOND ADVANCED .7 DNX12 (GAUZE/BANDAGES/DRESSINGS) ×1 IMPLANT
DEVICE SUTURE ENDOST 10MM (ENDOMECHANICALS) ×3 IMPLANT
DRAPE CAMERA CLOSED 9X96 (DRAPES) ×3 IMPLANT
DRAPE UNDER BUTTOCK W/FLU (DRAPES) ×3 IMPLANT
DRSG TEGADERM 2-3/8X2-3/4 SM (GAUZE/BANDAGES/DRESSINGS) IMPLANT
ENDOSTITCH 0 SINGLE 48 (SUTURE) ×15 IMPLANT
GAUZE SPONGE NON-WVN 2X2 STRL (MISCELLANEOUS) IMPLANT
GLOVE BIO SURGEON STRL SZ8 (GLOVE) ×15 IMPLANT
GLOVE INDICATOR 8.0 STRL GRN (GLOVE) ×3 IMPLANT
GOWN STRL REUS W/ TWL LRG LVL3 (GOWN DISPOSABLE) ×1 IMPLANT
GOWN STRL REUS W/ TWL XL LVL3 (GOWN DISPOSABLE) ×2 IMPLANT
GOWN STRL REUS W/TWL LRG LVL3 (GOWN DISPOSABLE) ×2
GOWN STRL REUS W/TWL XL LVL3 (GOWN DISPOSABLE) ×4
GRASPER SUT TROCAR 14GX15 (MISCELLANEOUS) ×3 IMPLANT
IRRIGATION STRYKERFLOW (MISCELLANEOUS) ×1 IMPLANT
IRRIGATOR STRYKERFLOW (MISCELLANEOUS) ×3
IV LACTATED RINGERS 1000ML (IV SOLUTION) ×3 IMPLANT
KIT PINK PAD W/HEAD ARE REST (MISCELLANEOUS) ×3
KIT PINK PAD W/HEAD ARM REST (MISCELLANEOUS) ×1 IMPLANT
KIT RM TURNOVER CYSTO AR (KITS) ×3 IMPLANT
LABEL OR SOLS (LABEL) ×3 IMPLANT
MANIPULATOR VCARE LG CRV RETR (MISCELLANEOUS) IMPLANT
MANIPULATOR VCARE SML CRV RETR (MISCELLANEOUS) ×3 IMPLANT
MANIPULATOR VCARE STD CRV RETR (MISCELLANEOUS) IMPLANT
NEEDLE VERESS 14GA 120MM (NEEDLE) ×3 IMPLANT
NS IRRIG 500ML POUR BTL (IV SOLUTION) ×3 IMPLANT
OCCLUDER COLPOPNEUMO (BALLOONS) ×3 IMPLANT
PACK GYN LAPAROSCOPIC (MISCELLANEOUS) ×3 IMPLANT
PAD OB MATERNITY 4.3X12.25 (PERSONAL CARE ITEMS) ×3 IMPLANT
PAD PREP 24X41 OB/GYN DISP (PERSONAL CARE ITEMS) ×3 IMPLANT
SCISSORS METZENBAUM CVD 33 (INSTRUMENTS) IMPLANT
SET CYSTO W/LG BORE CLAMP LF (SET/KITS/TRAYS/PACK) ×3 IMPLANT
SHEARS HARMONIC ACE PLUS 36CM (ENDOMECHANICALS) ×3 IMPLANT
SLEEVE ENDOPATH XCEL 5M (ENDOMECHANICALS) ×3 IMPLANT
SPONGE LAP 18X18 5 PK (GAUZE/BANDAGES/DRESSINGS) ×3 IMPLANT
SPONGE VERSALON 2X2 STRL (MISCELLANEOUS)
SPONGE XRAY 4X4 16PLY STRL (MISCELLANEOUS) ×3 IMPLANT
SURGILUBE 2OZ TUBE FLIPTOP (MISCELLANEOUS) ×3 IMPLANT
SUT VIC AB 0 CT1 36 (SUTURE) ×3 IMPLANT
SYR 50ML LL SCALE MARK (SYRINGE) ×3 IMPLANT
SYRINGE 10CC LL (SYRINGE) ×3 IMPLANT
TROCAR ENDO BLADELESS 11MM (ENDOMECHANICALS) ×3 IMPLANT
TROCAR XCEL NON-BLD 5MMX100MML (ENDOMECHANICALS) ×3 IMPLANT
TUBING INSUF HEATED (TUBING) ×3 IMPLANT

## 2017-04-15 NOTE — Anesthesia Postprocedure Evaluation (Signed)
Anesthesia Post Note  Patient: Crystal MornShequila L Mendez  Procedure(s) Performed: Procedure(s) (LRB): HYSTERECTOMY TOTAL LAPAROSCOPIC (N/A) CYSTOSCOPY (N/A)  Patient location during evaluation: PACU Anesthesia Type: General Level of consciousness: awake and alert and oriented Pain management: pain level controlled Vital Signs Assessment: post-procedure vital signs reviewed and stable Respiratory status: spontaneous breathing Cardiovascular status: blood pressure returned to baseline Anesthetic complications: no     Last Vitals:  Vitals:   04/15/17 1358 04/15/17 1403  BP:    Pulse: 75 73  Resp: 16 17  Temp:      Last Pain:  Vitals:   04/15/17 1403  TempSrc:   PainSc: 5                  Xiomara Sevillano

## 2017-04-15 NOTE — Anesthesia Procedure Notes (Signed)
Procedure Name: Intubation Date/Time: 04/15/2017 12:10 PM Performed by: Ginger CarneMICHELET, Filemon Breton Pre-anesthesia Checklist: Patient identified, Emergency Drugs available, Suction available, Patient being monitored and Timeout performed Patient Re-evaluated:Patient Re-evaluated prior to inductionOxygen Delivery Method: Circle system utilized Preoxygenation: Pre-oxygenation with 100% oxygen Intubation Type: IV induction Ventilation: Mask ventilation without difficulty Laryngoscope Size: Miller and 2 Grade View: Grade I Tube type: Oral Tube size: 7.5 mm Number of attempts: 1 Airway Equipment and Method: Stylet Placement Confirmation: ETT inserted through vocal cords under direct vision,  positive ETCO2 and breath sounds checked- equal and bilateral Secured at: 22 cm Tube secured with: Tape Dental Injury: Teeth and Oropharynx as per pre-operative assessment

## 2017-04-15 NOTE — Op Note (Signed)
Operative Report:  PRE-OP DIAGNOSIS: MENOMETRORRHAGIA,ADENOMYOSIS   POST-OP DIAGNOSIS: MENOMETRORRHAGIA,ADENOMYOSIS   PROCEDURE: Procedure(s): HYSTERECTOMY TOTAL LAPAROSCOPIC CYSTOSCOPY  SURGEON: Annamarie MajorPaul Bronislaw Switzer, MD, FACOG  ASSISTANBonney Aid: Staebler, MD   ANESTHESIA: General endotracheal anesthesia  ESTIMATED BLOOD LOSS: less than 50   SPECIMENS: Uterus, Cervix.  COMPLICATIONS: None  DISPOSITION: stable to PACU  FINDINGS: Intraabdominal adhesions were noted. Normal ovaries.  Min omental adhesion to anterior abd wall superior to umb.  PROCEDURE:  The patient was taken to the OR where anesthesia was administed. She was prepped and draped in the normal sterile fashion in the dorsal lithotomy position in the [place] stirrups. A time out was performed. A Graves speculum was inserted, the cervix was grasped with a single tooth tenaculum and the endometrial cavity was sounded. The cervix was progressively dilated to a size 18 [place] with [employer] dilators. A V-Care uterine manipulator was inserted in the usual fashion without incident. Gloves were changed and attention was turned to the abdomen.   An infraumbilical transverse 5mm skin incision was made with the scalpel after local anesthesia applied to the skin. A Veress-step needle was inserted in the usual fashion and confirmed using the hanging drop technique. A pneumoperitoneum was obtained by insufflation of CO2 (opening pressure of 4mmHg) to 15mmHg. A diagnostic laparoscopy was performed yielding the previously described findings. Attention was turned to the left lower quadrant where after visualization of the inferior epigastric vessels a 5mm skin incision was made with the scalpel. A 5 mm laparoscopic port was inserted. The same procedure was repeated in the right lower quadrant with a 11mm trocar. Attention was turned to the left aspect of the uterus, where after visualization of the ureter, the round ligament was coagulated and transected using the  5mm Harmonic Scapel. The anterior and posterior leafs of the broad ligament were dissected off as the anterior one was coagulated and transected in a caudal direction towards the cuff of the uterine manipulator.  The uterine-ovarian ligament and its blood vessels were carefully coagulated and transected using the Harmonic scapel.  Attention was turned to the right aspect of the uterus where the same procedure was performed.  The vesicouterine reflection of the peritoneum was dissected with the harmonic scapel and the bladder flap was created bluntly.  The uterine vessels were coagulated and transected bilaterally using first bipolar cautery and then the harmonic scapel. A 360 degree, circumferential colpotomy was done to completely amputate the uterus with cervix and tubes. Once the specimen was amputated it was delivered through the vagina.   The colpotomy was repaired in a simple interrupted fashion using a 0-Polysorb suture with an endo-stitch device.  Vaginal exam confirms complete closure.  The cavity was copiously irrigated. A survey of the pelvic cavity revealed adequate hemostasis and no injury to bowel, bladder, or ureter.   A diagnostic cystoscopy was performed using saline distension of bladder with no lesions or injuries noted.  Bilateral urine flow from each ureteral orifice is visualized.  At this point the procedure was finalized. All the instruments were removed from the patient's body. Gas was expelled and patient is leveled.  Incisions are closed with skin adhesive.    Patient goes to recovery room in stable condition.  All sponge, instrument, and needle counts are correct x2.     Annamarie MajorPaul Resha Filippone, MD, Merlinda FrederickFACOG [facility], [facility] 04/15/2017  1:25 PM

## 2017-04-15 NOTE — Transfer of Care (Signed)
Immediate Anesthesia Transfer of Care Note  Patient: Crystal MornShequila L Bangerter  Procedure(s) Performed: Procedure(s): HYSTERECTOMY TOTAL LAPAROSCOPIC (N/A) CYSTOSCOPY (N/A)  Patient Location: PACU  Anesthesia Type:General  Level of Consciousness: awake  Airway & Oxygen Therapy: Patient Spontanous Breathing and Patient connected to face mask oxygen  Post-op Assessment: Report given to RN and Post -op Vital signs reviewed and stable  Post vital signs: Reviewed and stable  Last Vitals:  Vitals:   04/15/17 1127  BP: 131/83  Pulse: 88  Resp: 16  Temp: 36.9 C    Last Pain:  Vitals:   04/15/17 1127  TempSrc: Tympanic  PainSc: 2       Patients Stated Pain Goal: 0 (04/15/17 1127)  Complications: No apparent anesthesia complications

## 2017-04-15 NOTE — H&P (Signed)
History and Physical Interval Note:  04/15/2017 11:17 AM  [name]  has presented today for surgery, with the diagnosis of MENOMETRORRHAGIA,ADENOMYOSIS, CERVICAL DYSPLASIA.   The various methods of treatment have been discussed with the patient and family. After consideration of risks, benefits and other options for treatment, the patient has consented to  Procedure(s): HYSTERECTOMY TOTAL LAPAROSCOPIC (N/A) CYSTOSCOPY (N/A)  and BILATERAL SALPINGECTOMY as a surgical intervention .   The patient's history has been reviewed, patient examined, no change in status, stable for surgery.  Pt has the following beta blocker history-  Not taking Beta Blocker.  I have reviewed the patient's chart and labs.  Questions were answered to the patient's satisfaction.    Letitia Libraobert Paul Nicolaos Mitrano

## 2017-04-15 NOTE — Discharge Instructions (Addendum)
Total Laparoscopic Hysterectomy, Care After Refer to this sheet in the next few weeks. These instructions provide you with information on caring for yourself after your procedure. Your health care provider may also give you more specific instructions. Your treatment has been planned according to current medical practices, but problems sometimes occur. Call your health care provider if you have any problems or questions after your procedure. What can I expect after the procedure?  Pain and bruising at the incision sites. You will be given pain medicine to control it.  Menopausal symptoms such as hot flashes, night sweats, and insomnia if your ovaries were removed.  Sore throat from the breathing tube that was inserted during surgery. Follow these instructions at home:  Only take over-the-counter or prescription medicines for pain, discomfort, or fever as directed by your health care provider.  Do not take aspirin. It can cause bleeding.  Do not drive when taking pain medicine.  Follow your health care provider's advice regarding diet, exercise, lifting, driving, and general activities.  Resume your usual diet as directed and allowed.  Get plenty of rest and sleep.  Do not douche, use tampons, or have sexual intercourse for at least 6 weeks, or until your health care provider gives you permission.  REMOVE your bandages (dressings) as directed by your health care provider - [facility].  Monitor your temperature and notify your health care provider of a fever.  Take showers instead of baths for 2-3 weeks.  Do not drink alcohol until your health care provider gives you permission.  If you develop constipation, you may take a mild laxative with your health care provider's permission. Bran foods may help with constipation problems. Drinking enough fluids to keep your urine clear or pale yellow may help as well.  Try to have someone home with you for 1-2 weeks to help around the  house.  Keep all of your follow-up appointments as directed by your health care provider. Contact a health care provider if:  You have swelling, redness, or increasing pain around your incision sites.  You have pus coming from your incision.  You notice a bad smell coming from your incision.  Your incision breaks open.  You feel dizzy or lightheaded.  You have pain or bleeding when you urinate.  You have persistent diarrhea.  You have persistent nausea and vomiting.  You have abnormal vaginal discharge.  You have a rash.  You have any type of abnormal reaction or develop an allergy to your medicine.  You have poor pain control with your prescribed medicine. Get help right away if:  You have chest pain or shortness of breath.  You have severe abdominal pain that is not relieved with pain medicine.  You have pain or swelling in your legs. This information is not intended to replace advice given to you by your health care provider. Make sure you discuss any questions you have with your health care provider. Document Released: 07/28/2013 Document Revised: 03/14/2016 Document Reviewed: 04/27/2013 Elsevier Interactive Patient Education  2017 Elsevier Inc.  General Anesthesia, Adult, Care After These instructions provide you with information about caring for yourself after your procedure. Your health care provider may also give you more specific instructions. Your treatment has been planned according to current medical practices, but problems sometimes occur. Call your health care provider if you have any problems or questions after your procedure. What can I expect after the procedure? After the procedure, it is common to have:  Vomiting.  A sore throat.  Mental slowness.  It is common to feel:  Nauseous.  Cold or shivery.  Sleepy.  Tired.  Sore or achy, even in parts of your body where you did not have surgery.  Follow these instructions at home: For at  least 24 hours after the procedure:  Do not: ? Participate in activities where you could fall or become injured. ? Drive. ? Use heavy machinery. ? Drink alcohol. ? Take sleeping pills or medicines that cause drowsiness. ? Make important decisions or sign legal documents. ? Take care of children on your own.  Rest. Eating and drinking  If you vomit, drink water, juice, or soup when you can drink without vomiting.  Drink enough fluid to keep your urine clear or pale yellow.  Make sure you have little or no nausea before eating solid foods.  Follow the diet recommended by your health care provider. General instructions  Have a responsible adult stay with you until you are awake and alert.  Return to your normal activities as told by your health care provider. Ask your health care provider what activities are safe for you.  Take over-the-counter and prescription medicines only as told by your health care provider.  If you smoke, do not smoke without supervision.  Keep all follow-up visits as told by your health care provider. This is important. Contact a health care provider if:  You continue to have nausea or vomiting at home, and medicines are not helpful.  You cannot drink fluids or start eating again.  You cannot urinate after 8-12 hours.  You develop a skin rash.  You have fever.  You have increasing redness at the site of your procedure. Get help right away if:  You have difficulty breathing.  You have chest pain.  You have unexpected bleeding.  You feel that you are having a life-threatening or urgent problem. This information is not intended to replace advice given to you by your health care provider. Make sure you discuss any questions you have with your health care provider. Document Released: 01/13/2001 Document Revised: 03/11/2016 Document Reviewed: 09/21/2015 Elsevier Interactive Patient Education  [employer].

## 2017-04-15 NOTE — Anesthesia Post-op Follow-up Note (Cosign Needed)
Anesthesia QCDR form completed.        

## 2017-04-15 NOTE — Anesthesia Preprocedure Evaluation (Signed)
Anesthesia Evaluation  Patient identified by MRN, date of birth, ID band Patient awake    Reviewed: Allergy & Precautions, NPO status , Patient's Chart, lab work & pertinent test results  Airway Mallampati: II       Dental  (+) Chipped   Pulmonary neg pulmonary ROS,    Pulmonary exam normal        Cardiovascular hypertension, Normal cardiovascular exam     Neuro/Psych Anxiety negative neurological ROS     GI/Hepatic GERD  Medicated,  Endo/Other  diabetes, Well Controlled, Type 2Hyperthyroidism   Renal/GU   negative genitourinary   Musculoskeletal   Abdominal Normal abdominal exam  (+)   Peds negative pediatric ROS (+)  Hematology  (+) anemia , JEHOVAH'S WITNESS  Anesthesia Other Findings   Reproductive/Obstetrics                             Anesthesia Physical Anesthesia Plan  ASA: II  Anesthesia Plan: General   Post-op Pain Management:    Induction: Cricoid pressure planned, Rapid sequence and Intravenous  PONV Risk Score and Plan: 4 or greater and Ondansetron, Dexamethasone, Propofol, Midazolam, Scopolamine patch - Pre-op and Treatment may vary due to age or medical condition  Airway Management Planned: Oral ETT  Additional Equipment:   Intra-op Plan:   Post-operative Plan: Extubation in OR  Informed Consent: I have reviewed the patients History and Physical, chart, labs and discussed the procedure including the risks, benefits and alternatives for the proposed anesthesia with the patient or authorized representative who has indicated his/her understanding and acceptance.   Dental advisory given  Plan Discussed with: CRNA and Surgeon  Anesthesia Plan Comments:         Anesthesia Quick Evaluation

## 2017-04-16 ENCOUNTER — Encounter: Payer: Self-pay | Admitting: Obstetrics & Gynecology

## 2017-04-17 LAB — SURGICAL PATHOLOGY

## 2017-04-30 ENCOUNTER — Ambulatory Visit (INDEPENDENT_AMBULATORY_CARE_PROVIDER_SITE_OTHER): Payer: Medicaid Other | Admitting: Obstetrics & Gynecology

## 2017-04-30 ENCOUNTER — Encounter: Payer: Self-pay | Admitting: Obstetrics & Gynecology

## 2017-04-30 VITALS — BP 120/80 | HR 82 | Ht 67.0 in | Wt 253.0 lb

## 2017-04-30 DIAGNOSIS — N8003 Adenomyosis of the uterus: Secondary | ICD-10-CM

## 2017-04-30 DIAGNOSIS — N870 Mild cervical dysplasia: Secondary | ICD-10-CM

## 2017-04-30 DIAGNOSIS — N800 Endometriosis of uterus: Secondary | ICD-10-CM

## 2017-04-30 DIAGNOSIS — N809 Endometriosis, unspecified: Secondary | ICD-10-CM

## 2017-04-30 NOTE — Progress Notes (Signed)
  [name] and cervical dysplasia, 2 weeks ago.  Subjective: Patient reports marked improvement in her preop symptoms. Eating a regular diet without difficulty. The patient is not having any pain.  Activity: normal activities of daily living. Patient reports vaginal sx's of None  Objective: BP 120/80   Pulse 82   Ht 5\' 7"  (1.702 m)   Wt 253 lb (114.8 kg)   BMI 39.63 kg/m  OBGyn Exam  Assessment: s/p :  total laparoscopic hysterectomy with bilateral salpingectomy stable  Plan: Patient has done well after surgery with no apparent complications.  I have discussed the post-operative course to date, and the expected progress moving forward.  The patient understands what complications to be concerned about.  I will see the patient in routine follow up, or sooner if needed.   Path reviewed w pt Activity plan: No heavy lifting.Pelvic rest for entire 6 weeks  Crystal LibraRobert Paul Mendez 04/30/2017, 9:54 AM

## 2017-05-17 ENCOUNTER — Encounter: Payer: Self-pay | Admitting: Emergency Medicine

## 2017-05-17 ENCOUNTER — Emergency Department
Admission: EM | Admit: 2017-05-17 | Discharge: 2017-05-17 | Disposition: A | Discharge status: Home / Self Care | Payer: Medicaid Other | Attending: Emergency Medicine | Admitting: Emergency Medicine

## 2017-05-17 DIAGNOSIS — Z79899 Other long term (current) drug therapy: Secondary | ICD-10-CM | POA: Diagnosis not present

## 2017-05-17 DIAGNOSIS — K047 Periapical abscess without sinus: Secondary | ICD-10-CM | POA: Diagnosis not present

## 2017-05-17 DIAGNOSIS — K0889 Other specified disorders of teeth and supporting structures: Secondary | ICD-10-CM | POA: Diagnosis present

## 2017-05-17 DIAGNOSIS — I10 Essential (primary) hypertension: Secondary | ICD-10-CM | POA: Insufficient documentation

## 2017-05-17 DIAGNOSIS — E119 Type 2 diabetes mellitus without complications: Secondary | ICD-10-CM | POA: Diagnosis not present

## 2017-05-17 MED ORDER — AMOXICILLIN 500 MG PO CAPS
500.0000 mg | ORAL_CAPSULE | Freq: Once | ORAL | Status: AC
  Administered 2017-05-17: 500 mg via ORAL
  Filled 2017-05-17: qty 1

## 2017-05-17 MED ORDER — AMOXICILLIN 875 MG PO TABS: 875.0000 mg | ORAL_TABLET | Freq: Two times a day (BID) | ORAL | 0 refills | Status: DC

## 2017-05-17 NOTE — ED Provider Notes (Signed)
[facility]-EMERGENCY DEPARTMENT Provider Note   CSN: [id number] Arrival date & time: 05/17/17  1739     History   Chief Complaint Chief Complaint  Patient presents with  . Dental Pain    HPI Crystal Mendez is a 31 y.o. female presents to emergency department for a wish and dental pain. Patient had 2 teeth pulled 3 days ago and was doing well up until today when sheincreased pain and swelling. She has noticed a foul taste in her mouth. She denies any fevers or difficulty swallowing. Pain is been manageable with ibuprofen. She does not have any antibiotic's. She denies any numbness or tingling. No nausea or vomiting. No chest pain or shortness of breath.  HPI  Past Medical History:  Diagnosis Date  . Abnormal Pap smear of cervix 05/01/2016  . Anemia   . Anxiety   . Complication of anesthesia    ITCHING  . Diabetes mellitus without complication (HCC)    WAS ON METFORMIN BUT A1C HAS DECREASED SO MD TOOK PT OFF-LAST A1C ON 03-2017 WAS 5.7  . GERD (gastroesophageal reflux disease)    NO MEDS  . Hyperthyroidism     Patient Active Problem List   Diagnosis Date Noted  . Acute pelvic pain, female 04/15/2017  . Adenomyosis 04/15/2017  . Iron deficiency anemia 09/05/2016  . Essential hypertension 08/05/2016  . Gastroesophageal reflux disease without esophagitis 08/05/2016  . Morbid obesity (HCC) 08/05/2016  . Type 2 diabetes mellitus without complication, without long-term current use of insulin (HCC) 08/05/2016  . Dysplasia of cervix, low grade (CIN 1) 06/19/2016    Past Surgical History:  Procedure Laterality Date  . CESAREAN SECTION     X 4  . CHOLECYSTECTOMY    . COLPOSCOPY  06/18/2016  . CYSTOSCOPY N/A 04/15/2017   Procedure: CYSTOSCOPY;  Surgeon: Nadara MustardHarris, Robert P, MD;  Location: [facility] ORS;  Service: Gynecology;  Laterality: N/A;  . LAPAROSCOPIC HYSTERECTOMY N/A 04/15/2017   Procedure: HYSTERECTOMY TOTAL LAPAROSCOPIC;  Surgeon: Nadara MustardHarris, Robert P, MD;  Location: [facility] ORS;   Service: Gynecology;  Laterality: N/A;  . TONSILLECTOMY    . TUBAL LIGATION    . TUBAL LIGATION      OB History    Gravida Para Term Preterm AB Living   4 4 4     4    SAB TAB Ectopic Multiple Live Births                   Home Medications    Prior to Admission medications   Medication Sig Start Date End Date Taking? Authorizing Provider  amoxicillin (AMOXIL) 875 MG tablet Take 1 tablet (875 mg total) by mouth 2 (two) times daily. X 10 days 05/17/17   Evon SlackGaines, Thomas C, PA-C  oxyCODONE-acetaminophen (PERCOCET) 5-325 MG tablet Take 1 tablet by mouth every 4 (four) hours as needed for moderate pain or severe pain. Patient not taking: Reported on 04/30/2017 04/15/17   Nadara MustardHarris, Robert P, MD  Probiotic Product (PROBIOTIC PO) Take 1 tablet by mouth daily.    [provider]    Family History History reviewed. No pertinent family history.  Social History Social History  Substance Use Topics  . Smoking status: Never Smoker  . Smokeless tobacco: Never Used  . Alcohol use No     Allergies   Eggs or egg-derived products and Lactose   Review of Systems Review of Systems  Constitutional: Negative.  Negative for chills and fever.  HENT: Positive for dental problem and facial swelling. Negative  for drooling, mouth sores, trouble swallowing and voice change.   Respiratory: Negative for chest tightness and shortness of breath.   Cardiovascular: Negative for chest pain.  Gastrointestinal: Negative for abdominal pain, diarrhea, nausea and vomiting.  Musculoskeletal: Negative for arthralgias, neck pain and neck stiffness.  Skin: Negative.   Psychiatric/Behavioral: Negative for confusion.  All other systems reviewed and are negative.    Physical Exam Updated Vital Signs BP 129/86   Pulse 77   Temp 99.1 F (37.3 C) (Oral)   Resp 18   Ht 5\' 7"  (1.702 m)   Wt 114.8 kg (253 lb)   SpO2 96%   BMI 39.63 kg/m   Physical Exam  Constitutional: She is oriented to person,  place, and time. She appears well-developed and well-nourished. No distress.  HENT:  Head: Normocephalic and atraumatic.  Right Ear: External ear normal.  Left Ear: External ear normal.  Nose: Nose normal.  Mouth/Throat: Uvula is midline and oropharynx is clear and moist. No oral lesions. No trismus in the jaw. Normal dentition. Dental caries (no fluctuant abscess) present. No dental abscesses or uvula swelling.  Eyes: EOM are normal.  Neck: Normal range of motion. Neck supple.  Cardiovascular: Normal rate.  Exam reveals no gallop and no friction rub.   No murmur heard. Pulmonary/Chest: Effort normal and breath sounds normal. No respiratory distress.  Neurological: She is alert and oriented to person, place, and time.  Skin: Skin is warm and dry.  Psychiatric: She has a normal mood and affect. Her behavior is normal. Thought content normal.     ED Treatments / Results  Labs (all labs ordered are listed, but only abnormal results are displayed) Labs Reviewed - No data to display  EKG  EKG Interpretation None       Radiology No results found.  Procedures Procedures (including critical care time)  Medications Ordered in ED Medications  amoxicillin (AMOXIL) capsule 500 mg (not administered)     Initial Impression / Assessment and Plan / ED Course  I have reviewed the triage vital signs and the nursing notes.  Pertinent labs & imaging results that were available during my care of the patient were reviewed by me and considered in my medical decision making (see chart for details).  31 year old female with left dental pain status post dental excision. She feels as if there is a foul odor and taste in her mouth. She has increased swelling and pain over the last 24 hours. She will continue with ibuprofen and Tylenol. She will start amoxicillin and follow-up with dental clinic Monday.  Final Clinical Impressions(s) / ED Diagnoses   Final diagnoses:  Pain, dental  Dental  infection    New Prescriptions New Prescriptions   AMOXICILLIN (AMOXIL) 875 MG TABLET    Take 1 tablet (875 mg total) by mouth 2 (two) times daily. X 10 days     Ronnette JuniperGaines, Thomas C, PA-C 05/17/17 2031    Emily FilbertWilliams, Jonathan E, MD 05/17/17 2106

## 2017-05-17 NOTE — Discharge Instructions (Signed)
Please take antibiotic as prescribed. Return to emergency department for any fevers increasing pain worsening symptoms or urgent changes in her health.

## 2017-05-17 NOTE — ED Notes (Signed)
Pt had 2 teeth pulled on left lower side of mouth.  Pt has swelling and increased pain.  Not taking any abx  States none were prescribed.  Pt is taking advil without pain relief.

## 2017-05-17 NOTE — ED Triage Notes (Signed)
Pt has some swelling to left lower jaw. Had teeth pulled Thursday and swelling started shortly after they were pulled.  Pain to this area. No fevers.

## 2017-05-29 ENCOUNTER — Ambulatory Visit: Payer: Medicaid Other | Admitting: Obstetrics & Gynecology

## 2017-06-24 ENCOUNTER — Ambulatory Visit: Payer: Medicaid Other

## 2017-08-22 ENCOUNTER — Ambulatory Visit: Payer: Self-pay | Admitting: Nurse Practitioner

## 2017-10-28 ENCOUNTER — Ambulatory Visit: Payer: Self-pay | Admitting: Nurse Practitioner

## 2017-11-18 ENCOUNTER — Other Ambulatory Visit: Payer: Self-pay

## 2017-11-18 ENCOUNTER — Ambulatory Visit: Payer: Self-pay | Admitting: Nurse Practitioner

## 2017-11-18 ENCOUNTER — Encounter: Payer: Self-pay | Admitting: Nurse Practitioner

## 2017-11-18 VITALS — BP 111/62 | HR 87 | Temp 98.4°F | Ht 66.0 in | Wt 262.2 lb

## 2017-11-18 DIAGNOSIS — Z7689 Persons encountering health services in other specified circumstances: Secondary | ICD-10-CM

## 2017-11-18 DIAGNOSIS — R5382 Chronic fatigue, unspecified: Secondary | ICD-10-CM

## 2017-11-18 DIAGNOSIS — R202 Paresthesia of skin: Secondary | ICD-10-CM

## 2017-11-18 DIAGNOSIS — I83893 Varicose veins of bilateral lower extremities with other complications: Secondary | ICD-10-CM

## 2017-11-18 DIAGNOSIS — Z87410 Personal history of cervical dysplasia: Secondary | ICD-10-CM

## 2017-11-18 DIAGNOSIS — G4700 Insomnia, unspecified: Secondary | ICD-10-CM

## 2017-11-18 DIAGNOSIS — E559 Vitamin D deficiency, unspecified: Secondary | ICD-10-CM

## 2017-11-18 NOTE — Patient Instructions (Addendum)
Crystal Mendez, Thank you for coming in to clinic today.  1. Start wearing compression socks daily to work while seated all day. - Get socks that have a range of pressure 22-30 mmHg  2. We are doing labs for evaluation of nutritional deficiencies.  Sleep Hygiene Tips  Take medicines only as directed by your health care provider.  Keep regular sleeping and waking hours. Avoid naps.  Keep a sleep diary to help you and your health care provider figure out what could be causing your insomnia. Include:  When you sleep.  When you wake up during the night.  How well you sleep.  How rested you feel the next day.  Any side effects of medicines you are taking.  What you eat and drink.  Make your bedroom a comfortable place where it is easy to fall asleep:  Put up shades or special blackout curtains to block light from outside.  Use a white noise machine to block noise.  Keep the temperature cool.  Exercise regularly as directed by your health care provider. Avoid exercising right before bedtime.  Use relaxation techniques to manage stress. Ask your health care provider to suggest some techniques that may work well for you. These may include:  Breathing exercises.  Routines to release muscle tension.  Visualizing peaceful scenes.  Cut back on alcohol, caffeinated beverages, and cigarettes, especially close to bedtime. These can disrupt your sleep.  Do not overeat or eat spicy foods right before bedtime. This can lead to digestive discomfort that can make it hard for you to sleep.  Limit screen use before bedtime. This includes:  Watching TV.  Using your smartphone, tablet, and computer.  Stick to a routine. This can help you fall asleep faster. Try to do a quiet activity, brush your teeth, and go to bed at the same time each night.  Get out of bed if you are still awake after 15 minutes of trying to sleep. Keep the lights down, but try reading or doing a quiet activity. When  you feel sleepy, go back to bed.  Make sure that you drive carefully. Avoid driving if you feel very sleepy.  Keep all follow-up appointments as directed by your health care provider. This is important.   Please schedule a follow-up appointment with Crystal McardleLauren Laron Mendez, AGNP. Return in about 3 months (around 02/16/2018) for varicose veins, Insomnia.  If you have any other questions or concerns, please feel free to call the clinic or send a message through MyChart. You may also schedule an earlier appointment if necessary.  You will receive a survey after today's visit either digitally by e-mail or paper by [employer] mail. Your experiences and feedback matter to us.  Please respond so we know how we are doing as we provide care for you.   Crystal McardleLauren Ritu Gagliardo, DNP, AGNP-BC Adult Gerontology Nurse Practitioner [facility], [facility]

## 2017-11-18 NOTE — Progress Notes (Signed)
Subjective:    Patient ID: Crystal Mendez, female    DOB: 04-20-1986, 32 y.o.   MRN: [id number]  Crystal Mendez is a 32 y.o. 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 pt states she wake up at night with severe night sweats x 7 mths ago ); Varicose Veins (pain in bilateral calves x 1 yr); and Thyroid Problem (abnormal TSH x 6 mths ago that was never addressed)   HPI  Establish Care New Provider Pt last seen by PCP [facility] in [place] about 1 year ago and has been to [facility]. Tiburcio Pea about 8 months ago.  Obtain records.  Varicose Veins Pt notes bulging and painful varicose veins.  She sits for prolonged periods of time and frequently has lower leg swelling.  Veins hurt less when she has less swelling.  Thyroid problem Pt reports she was told she had a thyroid problem, but never had it addressed.  She reports no symptoms other than weight gain. She does not remember her lab values nor does she have records of last test results.  Insomnia Tingling in legs and arms during night and waking her up.  Now is having hot flashes.  Has had hysterectomy, but ovaries remain.  Usually wakes 12, 3, up at 6 for the day. - Hands and feet freezing sometimes. Sometimes has trouble falling asleep r/t tingling in hands because of being cold.  Pt believes it may be related to anemia, so she started taking iron today.  She is taking a [employer] iron tablet and plans to take one tablet every day.  Constipated except for gentle absorb in past, so that is what she purchased.   Past Medical History:  Diagnosis Date  . Abnormal Pap smear of cervix 05/01/2016  . Anemia   . Anxiety   . Complication of anesthesia    ITCHING  . Diabetes mellitus without complication (HCC)    WAS ON METFORMIN BUT A1C HAS DECREASED SO MD TOOK PT OFF-LAST A1C ON 03-2017 WAS 5.7  . GERD (gastroesophageal reflux disease)    NO MEDS  . Hyperthyroidism    Past Surgical  History:  Procedure Laterality Date  . CESAREAN SECTION     X 4  . CHOLECYSTECTOMY    . COLPOSCOPY  06/18/2016  . CYSTOSCOPY N/A 04/15/2017   Procedure: CYSTOSCOPY;  Surgeon: Nadara Mustard, MD;  Location: [facility] ORS;  Service: Gynecology;  Laterality: N/A;  . LAPAROSCOPIC HYSTERECTOMY N/A 04/15/2017   Ovaries remain - Procedure: HYSTERECTOMY TOTAL LAPAROSCOPIC;  Surgeon: Nadara Mustard, MD;  Location: [facility] ORS;  Service: Gynecology;  Laterality: N/A;  . TONSILLECTOMY    . TUBAL LIGATION    . TUBAL LIGATION     Social History   Socioeconomic History  . Marital status: Divorced    Spouse name: Not on file  . Number of children: Not on file  . Years of education: Not on file  . Highest education level: Not on file  Social Needs  . Financial resource strain: Not on file  . Food insecurity - worry: Not on file  . Food insecurity - inability: Not on file  . Transportation needs - medical: Not on file  . Transportation needs - non-medical: Not on file  Occupational History  . Not on file  Tobacco Use  . Smoking status: Never Smoker  . Smokeless tobacco: Never Used  Substance and Sexual Activity  . Alcohol use: No  . Drug use: No  .  Sexual activity: No    Birth control/protection: Surgical  Other Topics Concern  . Not on file  Social History Narrative  . Not on file   Family History  Problem Relation Age of Onset  . Ataxia Mother        spinal cerebellar ataxia  . Stroke Father        ischemic  . Ataxia Sister   . Ataxia Brother   . Autism Son   . Autism Son   . Healthy Son   . Developmental delay Son    Current Outpatient Medications on File Prior to Visit  Medication Sig  . [place] POWD by Does not apply route.  Marland Kitchen BREWERS YEAST PO Take by mouth.  Marland Kitchen Cod Liver Oil 1000 MG CAPS Take by mouth.  . Probiotic Product (PROBIOTIC PO) Take 1 tablet by mouth daily.  . vitamin B-12 (CYANOCOBALAMIN) 100 MCG tablet Take 100 mcg by mouth daily.   No current  facility-administered medications on file prior to visit.     Review of Systems  Constitutional: Negative.   HENT: Negative.   Eyes: Negative.   Respiratory: Negative.   Cardiovascular: Positive for leg swelling.  Gastrointestinal: Negative.   Endocrine: Positive for cold intolerance and heat intolerance.  Genitourinary: Negative.   Musculoskeletal:       Leg pain from varicose veins  Skin: Negative.   Allergic/Immunologic: Negative.   Neurological: Positive for headaches.  Hematological: Negative.   Psychiatric/Behavioral: Positive for sleep disturbance.   Per HPI unless specifically indicated above      Objective:    BP 111/62 (BP Location: Right Arm, Patient Position: Sitting, Cuff Size: Large)   Pulse 87   Temp 98.4 F (36.9 C) (Oral)   Ht 5\' 6"  (1.676 m)   Wt 262 lb 3.2 oz (118.9 kg)   BMI 42.32 kg/m   Wt Readings from Last 3 Encounters:  11/18/17 262 lb 3.2 oz (118.9 kg)  05/17/17 253 lb (114.8 kg)  04/30/17 253 lb (114.8 kg)    Physical Exam  General - morbidly obese, well-appearing, NAD HEENT - Normocephalic, atraumatic, PERRL, EOMI, patent nares w/o congestion, oropharynx clear, MMM Neck - supple, non-tender, no LAD, no thyromegaly Heart - RRR, no murmurs heard Lungs - Clear throughout all lobes, no wheezing, crackles, or rhonchi. Normal work of breathing. Abdomen - soft, NTND, no masses, no hepatosplenomegaly by scratch test, active bowel sounds Extremeties - non-tender, +1 pitting edema, cap refill < 2 seconds, peripheral pulses intact +2 bilaterally.  Engorged varicose veins posterior calves bilaterally.  Currently non-tender, but edema less than most days as she has not been at work today and has been more active. Skin - warm, dry, no rashes Neuro - awake, alert, oriented x3, normal gait Psych - Normal mood and affect, normal behavior   Results for orders placed or performed in visit on 11/18/17  CBC with Differential/Platelet  Result Value Ref Range     WBC 7.5 3.4 - 10.8 x10E3/uL   RBC 4.67 3.77 - 5.28 x10E6/uL   Hemoglobin 12.1 11.1 - 15.9 g/dL   Hematocrit 16.1 09.6 - 46.6 %   MCV 80 79 - 97 fL   MCH 25.9 (L) 26.6 - 33.0 pg   MCHC 32.4 31.5 - 35.7 g/dL   RDW 04.5 (H) 40.9 - 81.1 %   Platelets 425 (H) 150 - 379 x10E3/uL   Neutrophils 35 Not Estab. %   Lymphs 56 Not Estab. %   Monocytes 6 Not Estab. %  Eos 2 Not Estab. %   Basos 1 Not Estab. %   Neutrophils Absolute 2.7 1.4 - 7.0 x10E3/uL   Lymphocytes Absolute 4.3 (H) 0.7 - 3.1 x10E3/uL   Monocytes Absolute 0.4 0.1 - 0.9 x10E3/uL   EOS (ABSOLUTE) 0.1 0.0 - 0.4 x10E3/uL   Basophils Absolute 0.0 0.0 - 0.2 x10E3/uL   Immature Granulocytes 0 Not Estab. %   Immature Grans (Abs) 0.0 0.0 - 0.1 x10E3/uL  Iron, TIBC and Ferritin Panel  Result Value Ref Range   Total Iron Binding Capacity 337 250 - 450 ug/dL   UIBC 960315 454131 - 098425 ug/dL   Iron 22 (L) 27 - 119159 ug/dL   Iron Saturation 7 (LL) 15 - 55 %   Ferritin 19 15 - 150 ng/mL  TSH + free T4  Result Value Ref Range   TSH 0.989 0.450 - 4.500 uIU/mL   Free T4 1.00 0.82 - 1.77 ng/dL  [id number] and Folate Panel  Result Value Ref Range   Vitamin B-12 498 232 - 1,245 pg/mL   Folate 6.2 >3.0 ng/mL  VITAMIN D 25 Hydroxy (Vit-D Deficiency, Fractures)  Result Value Ref Range   Vit D, 25-Hydroxy 8.6 (L) 30.0 - 100.0 ng/mL      Assessment & Plan:   Problem List Items Addressed This Visit      Other   History of cervical dysplasia Pt s/p hysterectomy with new hot flashes.  Encouraged pt to sleep with cool room, fan as tolerated. Discuss symptoms with OB-GYN in future.    Other Visit Diagnoses    Varicose veins of both legs with edema Pt with bilateral varicose veins of lower leg.  Complicated by body habitus, sedentary lifestyle as call center representative.  Plan: 1. Encouraged regular physical activity.  Can consider standing desk if needed. 2. Start wearing compression socks daily (knee high).  Recommend 25-25 mmHg. 3. Followup 3  months.  Can consider [facility] referral in future.  Insomnia Pt with frequent awakenings at night after hysterectomy.  Pt reports is still with ovaries and has not entered early menopause.  Plan: 1. Encouraged good sleep hygiene. 2. Can consider melatonin OTC up to 6 mg per night at bedtime. 3. Followup 3 months.  Chronic fatigue    -  Primary Pt with possible thyroid disorder, but notes history of anemia and possible vitamin D deficiency.    Plan: 1. Labs for screening possible causes of fatigue. - Normal results except for vitamin D.  Indicates deficiency.   - Suggest replacement with ergocalciferol 50,000 IU once weekly for 12 weeks.  Follow with 2,000 IU once daily OTC supplement. 2. No thyroid disease, anemia, or iron/B12 deficiency. 3. Follwoup as needed for vit. D deficiency.   Relevant Orders   CBC with Differential/Platelet (Completed)   Iron, TIBC and Ferritin Panel (Completed)   TSH + free T4 (Completed)   B12 and Folate Panel (Completed)   VITAMIN D 25 Hydroxy (Vit-D Deficiency, Fractures) (Completed)   Tingling in extremities     See plan for fatigue.   Relevant Orders   CBC with Differential/Platelet (Completed)   Iron, TIBC and Ferritin Panel (Completed)   B12 and Folate Panel (Completed)   VITAMIN D 25 Hydroxy (Vit-D Deficiency, Fractures) (Completed)   Encounter to establish care [facility]  Records will be requested.  Past medical, family, and surgical history reviewed w/ pt in clinic today.  Vitamin D deficiency     See plan fatigue.   Relevant  Medications   Vitamin D, Ergocalciferol, (DRISDOL) 50000 units CAPS capsule      Meds ordered this encounter  Medications  . Vitamin D, Ergocalciferol, (DRISDOL) 50000 units CAPS capsule    Sig: Take 1 capsule (50,000 Units total) by mouth every 7 (seven) days for 12 doses.    Dispense:  12 capsule    Refill:  0    Order Specific Question:   Supervising Provider    Answer:   Smitty Cords [[id number]]      Follow up plan: Return in about 3 months (around 02/16/2018) for varicose veins, Insomnia.  [name], DNP, AGPCNP-BC Adult Gerontology Primary Care Nurse Practitioner [facility] 12/14/2017, 4:24 PM

## 2017-11-19 LAB — CBC WITH DIFFERENTIAL/PLATELET
Basophils Absolute: 0 10*3/uL (ref 0.0–0.2)
Basos: 1 %
EOS (ABSOLUTE): 0.1 10*3/uL (ref 0.0–0.4)
Eos: 2 %
Hematocrit: 37.3 % (ref 34.0–46.6)
Hemoglobin: 12.1 g/dL (ref 11.1–15.9)
Immature Grans (Abs): 0 10*3/uL (ref 0.0–0.1)
Immature Granulocytes: 0 %
Lymphocytes Absolute: 4.3 10*3/uL — ABNORMAL HIGH (ref 0.7–3.1)
Lymphs: 56 %
MCH: 25.9 pg — ABNORMAL LOW (ref 26.6–33.0)
MCHC: 32.4 g/dL (ref 31.5–35.7)
MCV: 80 fL (ref 79–97)
Monocytes Absolute: 0.4 10*3/uL (ref 0.1–0.9)
Monocytes: 6 %
Neutrophils Absolute: 2.7 10*3/uL (ref 1.4–7.0)
Neutrophils: 35 %
Platelets: 425 10*3/uL — ABNORMAL HIGH (ref 150–379)
RBC: 4.67 x10E6/uL (ref 3.77–5.28)
RDW: 16.3 % — ABNORMAL HIGH (ref 12.3–15.4)
WBC: 7.5 10*3/uL (ref 3.4–10.8)

## 2017-11-19 LAB — IRON,TIBC AND FERRITIN PANEL
Ferritin: 19 ng/mL (ref 15–150)
Iron Saturation: 7 % — CL (ref 15–55)
Iron: 22 ug/dL — ABNORMAL LOW (ref 27–159)
Total Iron Binding Capacity: 337 ug/dL (ref 250–450)
UIBC: 315 ug/dL (ref 131–425)

## 2017-11-19 LAB — TSH+FREE T4
Free T4: 1 ng/dL (ref 0.82–1.77)
TSH: 0.989 u[IU]/mL (ref 0.450–4.500)

## 2017-11-19 LAB — B12 AND FOLATE PANEL
Folate: 6.2 ng/mL (ref >3.0)
Vitamin B-12: 498 pg/mL (ref 232–1245)

## 2017-11-19 LAB — VITAMIN D 25 HYDROXY (VIT D DEFICIENCY, FRACTURES): Vit D, 25-Hydroxy: 8.6 ng/mL — ABNORMAL LOW (ref 30.0–100.0)

## 2017-11-21 ENCOUNTER — Ambulatory Visit: Payer: Self-pay | Admitting: Nurse Practitioner

## 2017-11-21 MED ORDER — VITAMIN D (ERGOCALCIFEROL) 1.25 MG (50000 UNIT) PO CAPS: 50000.0000 [IU] | ORAL_CAPSULE | ORAL | 0 refills | Status: AC

## 2017-11-25 ENCOUNTER — Telehealth: Payer: Self-pay

## 2017-11-25 ENCOUNTER — Ambulatory Visit: Payer: Medicaid Other | Admitting: Nurse Practitioner

## 2017-11-25 NOTE — Telephone Encounter (Signed)
Pt complains of frequent bowel movement going every 30 minutes w/ watery stool. She states the symptoms started yesterday about 45 minutes after eating a hotdog off of a [employer]. She complains of possible fever yesterday, but have subsided today. I spoke w/ Leotis ShamesLauren and she recommended the pt to continue to push fluids and a brats diet.  If she notice her urination decrease and she goes 12 hrs w/o urinating she needs to seek medical care (Urgent Care or ER). Also if her symptoms worsen or persist after 48-72 hrs she needs to be seen.

## 2017-12-14 ENCOUNTER — Encounter: Payer: Self-pay | Admitting: Nurse Practitioner

## 2018-02-11 ENCOUNTER — Other Ambulatory Visit: Payer: Self-pay | Admitting: Nurse Practitioner

## 2018-02-11 DIAGNOSIS — E559 Vitamin D deficiency, unspecified: Secondary | ICD-10-CM

## 2018-02-20 ENCOUNTER — Ambulatory Visit: Payer: Medicaid Other | Admitting: Nurse Practitioner

## 2018-02-27 ENCOUNTER — Ambulatory Visit: Payer: Self-pay | Admitting: Nurse Practitioner

## 2018-03-06 ENCOUNTER — Ambulatory Visit: Payer: Self-pay | Admitting: Nurse Practitioner

## 2018-04-08 ENCOUNTER — Ambulatory Visit: Payer: Medicaid Other | Admitting: Family Medicine

## 2018-09-29 ENCOUNTER — Ambulatory Visit: Payer: Medicaid Other | Admitting: Nurse Practitioner

## 2018-10-22 ENCOUNTER — Ambulatory Visit: Payer: Medicaid Other | Admitting: Nurse Practitioner

## 2018-10-22 DIAGNOSIS — S39012A Strain of muscle, fascia and tendon of lower back, initial encounter: Secondary | ICD-10-CM | POA: Diagnosis not present

## 2018-10-22 DIAGNOSIS — B351 Tinea unguium: Secondary | ICD-10-CM | POA: Diagnosis not present

## 2018-10-27 ENCOUNTER — Ambulatory Visit (INDEPENDENT_AMBULATORY_CARE_PROVIDER_SITE_OTHER): Payer: Medicaid Other | Admitting: Nurse Practitioner

## 2018-10-27 ENCOUNTER — Ambulatory Visit
Admission: RE | Admit: 2018-10-27 | Discharge: 2018-10-27 | Disposition: A | Discharge status: Home / Self Care | Payer: Medicaid Other | Source: Ambulatory Visit | Attending: Nurse Practitioner | Admitting: Nurse Practitioner

## 2018-10-27 ENCOUNTER — Encounter: Payer: Self-pay | Admitting: Nurse Practitioner

## 2018-10-27 ENCOUNTER — Other Ambulatory Visit: Payer: Self-pay

## 2018-10-27 ENCOUNTER — Ambulatory Visit: Payer: Medicaid Other | Admitting: Nurse Practitioner

## 2018-10-27 VITALS — BP 110/59 | HR 74 | Temp 99.1°F | Ht 66.0 in | Wt 275.4 lb

## 2018-10-27 DIAGNOSIS — G8929 Other chronic pain: Secondary | ICD-10-CM

## 2018-10-27 DIAGNOSIS — M5442 Lumbago with sciatica, left side: Principal | ICD-10-CM

## 2018-10-27 DIAGNOSIS — M545 Low back pain: Secondary | ICD-10-CM | POA: Diagnosis not present

## 2018-10-27 DIAGNOSIS — B351 Tinea unguium: Secondary | ICD-10-CM | POA: Diagnosis not present

## 2018-10-27 MED ORDER — GABAPENTIN 100 MG PO CAPS: 100.0000 mg | ORAL_CAPSULE | Freq: Every day | ORAL | 2 refills | Status: DC

## 2018-10-27 NOTE — Patient Instructions (Addendum)
Crystal Mendez,   Thank you for coming in to clinic today.  1. You have a lumbar muscle strain.  - Start taking Tylenol extra strength 1 to 2 tablets every 6-8 hours for aches or fever/chills for next few days as needed.  Do not take more than 3,000 mg in 24 hours from all medicines.  May take Ibuprofen as well if tolerated 200-400mg  every 8 hours as needed. May alternate tylenol and ibuprofen in same day. - Use heat and ice.  Apply this for 15 minutes at a time 6-8 times per day.   - Muscle rub with lidocaine, lidocaine patch, Biofreeze, or tiger balm for topical pain relief.  Avoid using this with heat and ice to avoid burns. - Continue muscle relaxer baclofen 5-10 mg one tablet at bedtime for muscle tension. - For radiating pain down leg - START gabapentin 100 mg once about 1 hour before bed.  DO NOT TAKE WITH baclofen at the same time.  - START physical therapy for improving pain.  - ASK your sister if there has been any genetic testing done for her spinal cerebellar ataxia.  If positive, we can refer you to neurology for testing.   2. Toenail, let me know if and when you need podiatry referral.  Continue Lamisil cream for 2-3 months.      3. How To Practice Mindful Eating To practice mindfulness with eating, you'll need a series of exercises that you make part of your daily routine.  There are many simple ways to get started, some of which can have powerful benefits on their own.  It is best to use several of the following in combination for best results: . Eat more slowly and don't rush your meals.  [name]. Chew thoroughly.  . Eliminate distractions by turning off the TV and putting down your phone.  . Eat in silence or with other people at a dinner table. . Focus on how the food makes you feel.  . Stop eating when you're full.  . Ask yourself why you're eating. Are you actually hungry? Is it healthy? To begin with, it is a good idea to pick one meal per day, to focus on these  points. Once you've got the hang of this, mindfulness will become more natural. Then you can focus on implementing these habits into more meals.  Please schedule a follow-up appointment with Wilhelmina McardleLauren Zavion Sleight, AGNP. Return in 6 weeks as needed if symptoms worsen or fail to improve.  If you have any other questions or concerns, please feel free to call the clinic or send a message through MyChart. You may also schedule an earlier appointment if necessary.  You will receive a survey after today's visit either digitally by e-mail or paper by [employer] mail. Your experiences and feedback matter to us.  Please respond so we know how we are doing as we provide care for you.  Wilhelmina McardleLauren Jashanti Clinkscale, DNP, AGNP-BC Adult Gerontology Nurse Practitioner [facility]    Low Back Pain Exercises See other page with pictures of each exercise.  Start with 1 or 2 of these exercises that you are most comfortable with. Do not do any exercises that cause you significant worsening pain. Some of these may cause some "stretching soreness" but it should go away after you stop the exercise, and get better over time. Gradually increase up to 3-4 exercises as tolerated.  Standing hamstring stretch: Place the heel of your leg on a stool about 15 inches  high. Keep your knee straight. Lean forward, bending at the hips until you feel a mild stretch in the back of your thigh. Make sure you do not roll your shoulders and bend at the waist when doing this or you will stretch your lower back instead. Hold the stretch for 15 to 30 seconds. Repeat 3 times. Repeat the same stretch on your other leg.  Cat and camel: Get down on your hands and knees. Let your stomach sag, allowing your back to curve downward. Hold this position for 5 seconds. Then arch your back and hold for 5 seconds. Do 3 sets of 10.  Quadriped Arm/Leg Raises: Get down on your hands and knees. Tighten your abdominal muscles to stiffen your spine. While  keeping your abdominals tight, raise one arm and the opposite leg away from you. Hold this position for 5 seconds. Lower your arm and leg slowly and alternate sides. Do this 10 times on each side.  Pelvic tilt: Lie on your back with your knees bent and your feet flat on the floor. Tighten your abdominal muscles and push your lower back into the floor. Hold this position for 5 seconds, then relax. Do 3 sets of 10.  Partial curl: Lie on your back with your knees bent and your feet flat on the floor. Tighten your stomach muscles and flatten your back against the floor. Tuck your chin to your chest. With your hands stretched out in front of you, curl your upper body forward until your shoulders clear the floor. Hold this position for 3 seconds. Don't hold your breath. It helps to breathe out as you lift your shoulders up. Relax. Repeat 10 times. Build to 3 sets of 10. To challenge yourself, clasp your hands behind your head and keep your elbows out to the side.  Lower trunk rotation: Lie on your back with your knees bent and your feet flat on the floor. Tighten your abdominal muscles and push your lower back into the floor. Keeping your shoulders down flat, gently rotate your legs to one side, then the other as far as you can. Repeat 10 to 20 times.  Single knee to chest stretch: Lie on your back with your legs straight out in front of you. Bring one knee up to your chest and grasp the back of your thigh. Pull your knee toward your chest, stretching your buttock muscle. Hold this position for 15 to 30 seconds and return to the starting position. Repeat 3 times on each side.  Double knee to chest: Lie on your back with your knees bent and your feet flat on the floor. Tighten your abdominal muscles and push your lower back into the floor. Pull both knees up to your chest. Hold for 5 seconds and repeat 10 to 20 times.

## 2018-10-27 NOTE — Progress Notes (Signed)
Subjective:    Patient ID: Crystal Mendez, female    DOB: 08-Nov-1985, 33 y.o.   MRN: [id number]  Crystal Mendez is a 33 y.o. female presenting on 10/27/2018 for Back Pain (LEFT LOW BACK DISCOMFORT that RADIATES DOWN THE LEFT LEG, WITH NUMBNESS AND TINGLING. NO INJURY INTERMITTENT X 1YEAR, PAIN WORSEN OVER 3 MTH PERIOD. Pt recently seen at [facility] x 5 days ago and treated for back pain and nail fungus.) and Nail Problem (RT GREAT TOE FUNGUS X 2 MTHS )   HPI Low back pain Patient was provided with prednisone taper 1/2 for 6 days, tramadol 50 mg q6hr prn #20 tabs, and baclofen 10 mg #15 tabs - Has pain to lift LEFT arm, still has tingling in legs, low back has "excrutiating pain." - Worsened significantly on 1/2 so sought care at [facility] acute care.   - Is having problems with sitting all day at her [employer] job.  Tingling has changed to shooting pain on LEFT. - Mom, brother and sister have had spinal cerebellum ataxia -patient is concerned about possible complications from this. she fears paralyzation.  Morbid obesity Patient admits she has problems with emotional eating and has been out of work, which has led to more weight gain.  Fungal nail Patient has not yet tried recommendation from primary clinic for more than 2 to 3 days.  Has had fungal appearance to nail for greater than 2 months.  Requests additional advice.   Social History   Tobacco Use  . Smoking status: Never Smoker  . Smokeless tobacco: Never Used  Substance Use Topics  . Alcohol use: No  . Drug use: No    Review of Systems Per HPI unless specifically indicated above     Objective:    BP (!) 110/59 (BP Location: Left Arm, Patient Position: Sitting, Cuff Size: Large)   Pulse 74   Temp 99.1 F (37.3 C) (Oral)   Ht 5\' 6"  (1.676 m)   Wt 275 lb 6.4 oz (124.9 kg)   BMI 44.45 kg/m   Wt Readings from Last 3 Encounters:  10/27/18 275 lb 6.4 oz (124.9 kg)  11/18/17 262 lb 3.2 oz (118.9 kg)    05/17/17 253 lb (114.8 kg)    Physical Exam Vitals signs reviewed.  Constitutional:      General: She is not in acute distress.    Appearance: She is well-developed.  HENT:     Head: Normocephalic and atraumatic.  Cardiovascular:     Rate and Rhythm: Normal rate and regular rhythm.     Pulses:          Radial pulses are 2+ on the right side and 2+ on the left side.       Posterior tibial pulses are 1+ on the right side and 1+ on the left side.     Heart sounds: Normal heart sounds, S1 normal and S2 normal.  Pulmonary:     Effort: Pulmonary effort is normal. No respiratory distress.     Breath sounds: Normal breath sounds and air entry.  Musculoskeletal:     Right lower leg: No edema.     Left lower leg: No edema.     Comments: Low Back Inspection: Normal appearance, Large body habitus, no spinal deformity, symmetrical. Palpation: No tenderness over spinous processes. Bilateral lumbar paraspinal muscles tender and with hypertonicity/spasm. ROM: Full active ROM forward flex / back extension, rotation L/R with moderate discomfort Special Testing: Seated SLR with reproduced localized  LEFT back pain and LEFT radicular pain. Standing facet load test negative Strength: Bilateral hip flex/ext 5/5, knee flex/ext 5/5, ankle dorsiflex/plantarflex 5/5 Neurovascular: intact distal sensation to light touch   Skin:    General: Skin is warm and dry.     Capillary Refill: Capillary refill takes less than 2 seconds.  Neurological:     Mental Status: She is alert and oriented to person, place, and time.  Psychiatric:        Attention and Perception: Attention normal.        Mood and Affect: Mood and affect normal.        Behavior: Behavior normal. Behavior is cooperative.      Results for orders placed or performed in visit on 11/18/17  CBC with Differential/Platelet  Result Value Ref Range   WBC 7.5 3.4 - 10.8 x10E3/uL   RBC 4.67 3.77 - 5.28 x10E6/uL   Hemoglobin 12.1 11.1 - 15.9 g/dL    Hematocrit 18.8 41.6 - 46.6 %   MCV 80 79 - 97 fL   MCH 25.9 (L) 26.6 - 33.0 pg   MCHC 32.4 31.5 - 35.7 g/dL   RDW 60.6 (H) 30.1 - 60.1 %   Platelets 425 (H) 150 - 379 x10E3/uL   Neutrophils 35 Not Estab. %   Lymphs 56 Not Estab. %   Monocytes 6 Not Estab. %   Eos 2 Not Estab. %   Basos 1 Not Estab. %   Neutrophils Absolute 2.7 1.4 - 7.0 x10E3/uL   Lymphocytes Absolute 4.3 (H) 0.7 - 3.1 x10E3/uL   Monocytes Absolute 0.4 0.1 - 0.9 x10E3/uL   EOS (ABSOLUTE) 0.1 0.0 - 0.4 x10E3/uL   Basophils Absolute 0.0 0.0 - 0.2 x10E3/uL   Immature Granulocytes 0 Not Estab. %   Immature Grans (Abs) 0.0 0.0 - 0.1 x10E3/uL  Iron, TIBC and Ferritin Panel  Result Value Ref Range   Total Iron Binding Capacity 337 250 - 450 ug/dL   UIBC 093 235 - 573 ug/dL   Iron 22 (L) 27 - 220 ug/dL   Iron Saturation 7 (LL) 15 - 55 %   Ferritin 19 15 - 150 ng/mL  TSH + free T4  Result Value Ref Range   TSH 0.989 0.450 - 4.500 uIU/mL   Free T4 1.00 0.82 - 1.77 ng/dL  U54 and Folate Panel  Result Value Ref Range   Vitamin B-12 498 232 - 1,245 pg/mL   Folate 6.2 >3.0 ng/mL  VITAMIN D 25 Hydroxy (Vit-D Deficiency, Fractures)  Result Value Ref Range   Vit D, 25-Hydroxy 8.6 (L) 30.0 - 100.0 ng/mL      Assessment & Plan:   Problem List Items Addressed This Visit      Other   Obesity, Class III, BMI 40-49.9 (morbid obesity) (HCC) Worsening.  Discussed need to focus on 10-15 lbs weight loss over next 6 months for improving overall health and back pain.  Follow-up at next visit.    Other Visit Diagnoses    Chronic bilateral low back pain with left-sided sciatica    -  Primary Pain chronic, but likely self-limited due to muscle strain possible complicated by large body habitus and sedentary lifestyle.  Plan:  1. Treat with OTC pain meds (acetaminophen and ibuprofen).  Discussed alternate dosing and max dosing. 2. Apply heat and/or ice to affected area. 3. May also apply a muscle rub with lidocaine or lidocaine  patch after heat or ice. 4. Take muscle relaxer baclofen 5-10 mg up to three  times daily.  Cautioned drowsiness. 5. Finish prednisone taper as prescribed. 6. STOP tramadol as it is not effective 7. START gabapentin 100 mg at bedtime.  Avoid use with baclofen.  8. DG spine today since pain > 6 weeks duration.  Will need 6 weeks physical therapy prior to MRI.  - START physical therapy. 9. Follow up 6 weeks.   Relevant Medications   predniSONE (DELTASONE) 5 MG tablet   traMADol (ULTRAM) 50 MG tablet   baclofen (LIORESAL) 10 MG tablet   gabapentin (NEURONTIN) 100 MG capsule   Other Relevant Orders   DG Lumbar Spine Complete (Completed)   Ambulatory referral to Physical Therapy   Fungal nail infection     Subacute fungal nail infection.  Discussed with patient it could take up to 1 year for nail to fully be replaced.  Continue lotrimin ointment OTC bid x 6-12 months.      Meds ordered this encounter  Medications  . gabapentin (NEURONTIN) 100 MG capsule    Sig: Take 1 capsule (100 mg total) by mouth at bedtime.    Dispense:  30 capsule    Refill:  2    Order Specific Question:   Supervising Provider    Answer:   Smitty CordsKARAMALEGOS, ALEXANDER J [[id number]]    Follow up plan: Return in 6 weeks as needed if symptoms worsen or fail to improve.  Wilhelmina McardleLauren Sten Dematteo, DNP, AGPCNP-BC Adult Gerontology Primary Care Nurse Practitioner [facility] 10/27/2018, 3:13 PM

## 2018-10-28 ENCOUNTER — Encounter: Payer: Self-pay | Admitting: Obstetrics & Gynecology

## 2018-10-28 ENCOUNTER — Ambulatory Visit (INDEPENDENT_AMBULATORY_CARE_PROVIDER_SITE_OTHER): Payer: Medicaid Other | Admitting: Obstetrics & Gynecology

## 2018-10-28 ENCOUNTER — Ambulatory Visit: Payer: Medicaid Other | Admitting: Obstetrics & Gynecology

## 2018-10-28 VITALS — BP 120/80 | Ht 67.0 in | Wt 272.0 lb

## 2018-10-28 DIAGNOSIS — L708 Other acne: Secondary | ICD-10-CM | POA: Diagnosis not present

## 2018-10-28 DIAGNOSIS — L680 Hirsutism: Secondary | ICD-10-CM | POA: Insufficient documentation

## 2018-10-28 NOTE — Patient Instructions (Signed)
Hirsutism and Masculinization  Hirsutism is when a female has an excessive amount of hair in an area where a female would typically have hair growth, such as the face, chest, or back.  Masculinization is when a female's body develops like a female's body. This may include a deepening voice, loss of breast tissue, increased muscle mass, thinning scalp hair (balding), and clitoris growth.  Many things can cause these conditions. The most common cause is polycystic ovary syndrome (PCOS). Your health care provider may do blood tests and imaging studies to find the cause.  Follow these instructions at home:   Take over-the-counter and prescription medicines only as told by your health care provider.   Lose weight if you are overweight, and maintain a healthy weight.   Ask your health care provider for recommendations for hair removal. These may include temporary measures like shaving, creams, and waxing. More permanent ways to remove hair include electrolysis and laser treatments.   Keep all follow-up visits as told by your health care provider. This is important.  Contact a health care provider if:   Your symptoms suddenly get worse.   You develop acne.   You have irregular menstrual periods.   You stop having your period.   You feel a lump in your lower abdomen.  This information is not intended to replace advice given to you by your health care provider. Make sure you discuss any questions you have with your health care provider.  Document Released: 12/16/2001 Document Revised: 03/14/2016 Document Reviewed: 06/27/2015  Elsevier Interactive Patient Education  2019 Elsevier Inc.

## 2018-10-28 NOTE — Progress Notes (Signed)
CC: Hormonal Symptoms Patient complains of abnormal hair growth and acne changes as well as difficulty in losing weight.  This seems to have started since having partial hysterectomy last year (preservation of ovaries).  Hair growth mostly on chest.  Acne on back.  Cannot lose weight.  Has no energy.  No hot flashes or sweats.  No vag bleeding, LOU, pain.  PMHx: She  has a past medical history of Abnormal Pap smear of cervix (05/01/2016), Anemia, Anxiety, Complication of anesthesia, Diabetes mellitus without complication (HCC), GERD (gastroesophageal reflux disease), and Hyperthyroidism. Also,  has a past surgical history that includes Cholecystectomy; Tubal ligation; Tonsillectomy; Tubal ligation; Cesarean section; Colposcopy (06/18/2016); Laparoscopic hysterectomy (N/A, 04/15/2017); and Cystoscopy (N/A, 04/15/2017)., family history includes Ataxia in her brother, mother, and sister; Autism in her son and son; Developmental delay in her son; Healthy in her son; Stroke in her father.,  reports that she has never smoked. She has never used smokeless tobacco. She reports that she does not drink alcohol or use drugs.  She has a current medication list which includes the following prescription(s): baclofen, bentonite, brewers yeast, cod liver oil, gabapentin, prednisone, probiotic product, tramadol, and vitamin b-12. Also, is allergic to eggs or egg-derived products and lactose.  Review of Systems  Constitutional: Positive for malaise/fatigue. Negative for chills and fever.  HENT: Negative for congestion, sinus pain and sore throat.   Eyes: Negative for blurred vision and pain.  Respiratory: Negative for cough and wheezing.   Cardiovascular: Negative for chest pain and leg swelling.  Gastrointestinal: Negative for abdominal pain, constipation, diarrhea, heartburn, nausea and vomiting.  Genitourinary: Negative for dysuria, frequency, hematuria and urgency.  Musculoskeletal: Negative for back pain, joint  pain, myalgias and neck pain.  Skin: Negative for itching and rash.  Neurological: Negative for dizziness, tremors and weakness.  Endo/Heme/Allergies: Does not bruise/bleed easily.  Psychiatric/Behavioral: Negative for depression. The patient is not nervous/anxious and does not have insomnia.    Objective: BP 120/80   Ht 5\' 7"  (1.702 m)   Wt 272 lb (123.4 kg)   BMI 42.60 kg/m  Physical Exam Constitutional:      General: She is not in acute distress.    Appearance: She is well-developed.  Genitourinary:     Pelvic exam was performed with patient supine.     Vagina and rectum normal.     No vaginal erythema or bleeding.     No right or left adnexal mass present.     Right adnexa not tender.     Left adnexa not tender.     Genitourinary Comments: Cervix and uterus absent. Vaginal cuff healing well.  Cardiovascular:     Rate and Rhythm: Normal rate.  Pulmonary:     Effort: Pulmonary effort is normal.  Abdominal:     General: There is no distension.     Palpations: Abdomen is soft.     Tenderness: There is no abdominal tenderness.     Comments: Incision healing well.  Musculoskeletal: Normal range of motion.  Neurological:     Mental Status: She is alert and oriented to person, place, and time.     Cranial Nerves: No cranial nerve deficit.  Skin:    General: Skin is warm and dry.   ASSESSMENT/PLAN:  Hirsutism, Acne (new problems) and weight gain, with inability to lose weight on her own BMI 42, obesity.  Problem List Items Addressed This Visit      Musculoskeletal and Integument   Other acne   Relevant  Orders   FSH/LH   Estradiol   Testosterone, Free, Total, SHBG   TSH   Hemoglobin A1c   DHEA-sulfate   Hirsutism - Primary   Relevant Orders   FSH/LH   Estradiol   Testosterone, Free, Total, SHBG   TSH   Hemoglobin A1c   DHEA-sulfate     Other   Obesity, Class III, BMI 40-49.9 (morbid obesity) (HCC)   Relevant Orders   FSH/LH   Estradiol   Testosterone,  Free, Total, SHBG   TSH   Hemoglobin A1c   DHEA-sulfate    Consider hormone therapy as can help sx's     No concern for fertility, bleeding as has had hysterectomy    Weight loss goals by other means also discussed       May benefit from Phentermine protocol   A total of 25 minutes were spent face-to-face with the patient during this encounter and over half of that time dealt with counseling and coordination of care.  Annamarie Major, MD, [facility], [facility] 10/28/2018  2:57 PM

## 2018-10-29 ENCOUNTER — Telehealth: Payer: Self-pay

## 2018-10-29 NOTE — Telephone Encounter (Signed)
Pt calling to see if results are back from blood work yesterday.  [phone]  Adv they are not back yet.  May want to try tomorrow pm as they went out yesterday pm.

## 2018-10-30 ENCOUNTER — Other Ambulatory Visit: Payer: Self-pay | Admitting: Obstetrics & Gynecology

## 2018-10-30 ENCOUNTER — Encounter: Payer: Self-pay | Admitting: Nurse Practitioner

## 2018-10-30 LAB — TESTOSTERONE, FREE, TOTAL, SHBG
Sex Hormone Binding: 34.5 nmol/L (ref 24.6–122.0)
TESTOSTERONE: 7 ng/dL — AB (ref 8–48)
Testosterone, Free: 2 pg/mL (ref 0.0–4.2)

## 2018-10-30 LAB — ESTRADIOL: ESTRADIOL: 79.2 pg/mL

## 2018-10-30 LAB — DHEA-SULFATE: DHEA SO4: 86.1 ug/dL (ref 84.8–378.0)

## 2018-10-30 LAB — FSH/LH
FSH: 3.5 m[IU]/mL
LH: 4.9 m[IU]/mL

## 2018-10-30 LAB — HEMOGLOBIN A1C
Est. average glucose Bld gHb Est-mCnc: 131 mg/dL
Hgb A1c MFr Bld: 6.2 % — ABNORMAL HIGH (ref 4.8–5.6)

## 2018-10-30 LAB — TSH: TSH: 1.1 u[IU]/mL (ref 0.450–4.500)

## 2018-10-30 MED ORDER — PHENTERMINE HCL 37.5 MG PO TABS: 37.5000 mg | ORAL_TABLET | Freq: Every day | ORAL | 0 refills | Status: DC

## 2018-11-10 ENCOUNTER — Other Ambulatory Visit: Payer: Self-pay | Admitting: Nurse Practitioner

## 2018-11-10 DIAGNOSIS — G8929 Other chronic pain: Secondary | ICD-10-CM

## 2018-11-10 DIAGNOSIS — M5442 Lumbago with sciatica, left side: Principal | ICD-10-CM

## 2018-11-10 MED ORDER — GABAPENTIN 100 MG PO CAPS: 100.0000 mg | ORAL_CAPSULE | Freq: Every day | ORAL | 1 refills | Status: DC

## 2018-11-10 NOTE — Addendum Note (Signed)
Addended by: Smitty Cords on: 11/10/2018 01:09 PM   Modules accepted: Orders

## 2018-11-17 DIAGNOSIS — R195 Other fecal abnormalities: Secondary | ICD-10-CM | POA: Diagnosis not present

## 2018-11-17 DIAGNOSIS — R198 Other specified symptoms and signs involving the digestive system and abdomen: Secondary | ICD-10-CM | POA: Diagnosis not present

## 2018-11-17 DIAGNOSIS — R1013 Epigastric pain: Secondary | ICD-10-CM | POA: Diagnosis not present

## 2018-11-19 ENCOUNTER — Other Ambulatory Visit
Admission: RE | Admit: 2018-11-19 | Discharge: 2018-11-19 | Disposition: A | Discharge status: Home / Self Care | Payer: Medicaid Other | Source: Ambulatory Visit | Attending: Student | Admitting: Student

## 2018-11-19 DIAGNOSIS — R195 Other fecal abnormalities: Secondary | ICD-10-CM | POA: Diagnosis not present

## 2018-11-19 DIAGNOSIS — R198 Other specified symptoms and signs involving the digestive system and abdomen: Secondary | ICD-10-CM | POA: Insufficient documentation

## 2018-11-19 LAB — GASTROINTESTINAL PANEL BY PCR, STOOL (REPLACES STOOL CULTURE)
Adenovirus F40/41: NOT DETECTED
Astrovirus: NOT DETECTED
Campylobacter species: NOT DETECTED
Cryptosporidium: NOT DETECTED
Cyclospora cayetanensis: NOT DETECTED
ENTAMOEBA HISTOLYTICA: NOT DETECTED
ENTEROAGGREGATIVE E COLI (EAEC): NOT DETECTED
ENTEROTOXIGENIC E COLI (ETEC): NOT DETECTED
Enteropathogenic E coli (EPEC): NOT DETECTED
GIARDIA LAMBLIA: NOT DETECTED
NOROVIRUS GI/GII: NOT DETECTED
Plesimonas shigelloides: NOT DETECTED
Rotavirus A: NOT DETECTED
SHIGA LIKE TOXIN PRODUCING E COLI (STEC): NOT DETECTED
SHIGELLA/ENTEROINVASIVE E COLI (EIEC): NOT DETECTED
Salmonella species: NOT DETECTED
Sapovirus (I, II, IV, and V): NOT DETECTED
VIBRIO CHOLERAE: NOT DETECTED
Vibrio species: NOT DETECTED
Yersinia enterocolitica: NOT DETECTED

## 2018-11-19 LAB — C DIFFICILE QUICK SCREEN W PCR REFLEX
C DIFFICILE (CDIFF) TOXIN: NEGATIVE
C DIFFICLE (CDIFF) ANTIGEN: NEGATIVE
C Diff interpretation: NOT DETECTED

## 2018-11-24 LAB — CALPROTECTIN, FECAL: Calprotectin, Fecal: 24 ug/g (ref 0–120)

## 2018-11-26 LAB — PANCREATIC ELASTASE, FECAL

## 2018-12-01 ENCOUNTER — Ambulatory Visit: Payer: Medicaid Other | Attending: Nurse Practitioner | Admitting: Physical Therapy

## 2018-12-08 ENCOUNTER — Ambulatory Visit: Payer: Medicaid Other | Admitting: Physical Therapy

## 2018-12-09 ENCOUNTER — Ambulatory Visit: Payer: Medicaid Other | Admitting: Obstetrics & Gynecology

## 2018-12-10 ENCOUNTER — Ambulatory Visit: Payer: Medicaid Other | Admitting: Physical Therapy

## 2018-12-15 ENCOUNTER — Encounter: Payer: Medicaid Other | Admitting: Physical Therapy

## 2018-12-17 ENCOUNTER — Encounter: Payer: Self-pay | Admitting: Physical Therapy

## 2018-12-17 DIAGNOSIS — K210 Gastro-esophageal reflux disease with esophagitis: Secondary | ICD-10-CM | POA: Diagnosis not present

## 2018-12-17 DIAGNOSIS — K2950 Unspecified chronic gastritis without bleeding: Secondary | ICD-10-CM | POA: Diagnosis not present

## 2018-12-17 DIAGNOSIS — R194 Change in bowel habit: Secondary | ICD-10-CM | POA: Diagnosis not present

## 2018-12-17 DIAGNOSIS — R1013 Epigastric pain: Secondary | ICD-10-CM | POA: Diagnosis not present

## 2018-12-17 DIAGNOSIS — K640 First degree hemorrhoids: Secondary | ICD-10-CM | POA: Diagnosis not present

## 2018-12-17 DIAGNOSIS — K3189 Other diseases of stomach and duodenum: Secondary | ICD-10-CM | POA: Diagnosis not present

## 2018-12-18 ENCOUNTER — Ambulatory Visit (INDEPENDENT_AMBULATORY_CARE_PROVIDER_SITE_OTHER): Payer: Medicaid Other | Admitting: Obstetrics & Gynecology

## 2018-12-18 ENCOUNTER — Encounter: Payer: Self-pay | Admitting: Obstetrics & Gynecology

## 2018-12-18 VITALS — BP 120/80 | Ht 66.0 in | Wt 268.0 lb

## 2018-12-18 DIAGNOSIS — E119 Type 2 diabetes mellitus without complications: Secondary | ICD-10-CM | POA: Diagnosis not present

## 2018-12-18 DIAGNOSIS — L680 Hirsutism: Secondary | ICD-10-CM

## 2018-12-18 MED ORDER — CYANOCOBALAMIN 1000 MCG/ML IJ SOLN: 1000.0000 ug | Freq: Once | INTRAMUSCULAR | 1 refills | Status: AC

## 2018-12-18 MED ORDER — PHENTERMINE HCL 37.5 MG PO TABS: 37.5000 mg | ORAL_TABLET | Freq: Every day | ORAL | 1 refills | Status: DC

## 2018-12-18 NOTE — Progress Notes (Signed)
  History of Present Illness:  Crystal Mendez is a 33 y.o. who was started on Phentermine approximately 10 month ago due to obesity/abnormal weight gain. The patient has lost 4 pounds over the past month due to diet changes and meds..   She has these side effects: none.  PMHx: She  has a past medical history of Abnormal Pap smear of cervix (05/01/2016), Anemia, Anxiety, Complication of anesthesia, Diabetes mellitus without complication (HCC), GERD (gastroesophageal reflux disease), and Hyperthyroidism. Also,  has a past surgical history that includes Cholecystectomy; Tubal ligation; Tonsillectomy; Tubal ligation; Cesarean section; Colposcopy (06/18/2016); Laparoscopic hysterectomy (N/A, 04/15/2017); Cystoscopy (N/A, 04/15/2017); Bariatric Surgery; and Upper gastrointestinal endoscopy., family history includes Ataxia in her brother, mother, and sister; Autism in her son and son; Developmental delay in her son; Healthy in her son; Stroke in her father.,  reports that she has never smoked. She has never used smokeless tobacco. She reports that she does not drink alcohol or use drugs.  She has a current medication list which includes the following prescription(s): gabapentin, omeprazole, phentermine, bentonite, brewers yeast, cod liver oil, cyanocobalamin, prednisone, and probiotic product. Also, is allergic to eggs or egg-derived products and lactose.  Review of Systems  All other systems reviewed and are negative.   Physical Exam:  BP 120/80   Ht 5\' 6"  (1.676 m)   Wt 268 lb (121.6 kg)   BMI 43.26 kg/m  Body mass index is 43.26 kg/m. Filed Weights   12/18/18 0846  Weight: 268 lb (121.6 kg)    Physical Exam Constitutional:      General: She is not in acute distress.    Appearance: She is well-developed.  Musculoskeletal: Normal range of motion.  Neurological:     Mental Status: She is alert and oriented to person, place, and time.  Skin:    General: Skin is warm and dry.  Vitals signs  reviewed.   Assessment:  1. Type 2 diabetes mellitus without complication, without long-term current use of insulin (HCC) 2. Hirsutism 3. Obesity, Class III, BMI 40-49.9 (morbid obesity) (HCC)  Medication treatment is going adequately for her. Labs discussed, for hirsutism, no abnormalities to suggest etiology. Cont to monitor w weight loss to see if that alone would help. Meds discussed for hirsutism, side effects and costs and predictive success counseled.  Plan: Patient will be continued/added to prescription appetite suppressants: Phentermine and B12 injections added monthly.   Will continue to assist patient in incorporating positive experiences into her life to promote a positive mental attitude.  Education given regarding appropriate lifestyle changes for weight loss, including regular physical activity, healthy coping strategies, caloric restriction, and healthy eating patterns.  The risks and benefits as well as side effects of medication, such as Phenteramine or Tenuate, is discussed.  The pros and cons of suppressing appetite and boosting metabolism is counseled.  Risks of tolerance and addiction discussed.  Use of medicine will be short term.  Pt to call with any negative side effects and agrees to keep follow up appointments.  Patient needs PAP nv  A total of 15 minutes were spent face-to-face with the patient during this encounter and over half of that time dealt with counseling and coordination of care.  Annamarie Major, MD, [facility], [facility] 12/18/2018  9:01 AM

## 2018-12-22 ENCOUNTER — Encounter: Payer: Self-pay | Admitting: Obstetrics and Gynecology

## 2018-12-22 ENCOUNTER — Encounter: Payer: Medicaid Other | Admitting: Physical Therapy

## 2018-12-22 ENCOUNTER — Ambulatory Visit (INDEPENDENT_AMBULATORY_CARE_PROVIDER_SITE_OTHER): Payer: Medicaid Other | Admitting: Obstetrics and Gynecology

## 2018-12-22 VITALS — BP 118/70 | HR 61 | Ht 66.0 in | Wt 270.0 lb

## 2018-12-22 DIAGNOSIS — B3731 Acute candidiasis of vulva and vagina: Secondary | ICD-10-CM

## 2018-12-22 DIAGNOSIS — B373 Candidiasis of vulva and vagina: Secondary | ICD-10-CM | POA: Diagnosis not present

## 2018-12-22 LAB — POCT WET PREP WITH KOH
CLUE CELLS WET PREP PER HPF POC: NEGATIVE
KOH PREP POC: NEGATIVE
TRICHOMONAS UA: NEGATIVE
Yeast Wet Prep HPF POC: POSITIVE

## 2018-12-22 MED ORDER — FLUCONAZOLE 150 MG PO TABS: 150.0000 mg | ORAL_TABLET | Freq: Once | ORAL | 0 refills | Status: AC

## 2018-12-22 NOTE — Patient Instructions (Signed)
I value your feedback and entrusting us with your care. If you get a [place] patient survey, I would appreciate you taking the time to let us know about your experience today. Thank you! 

## 2018-12-22 NOTE — Progress Notes (Signed)
Galen Manila, NP   Chief Complaint  Patient presents with  . Vaginitis    little yellowish discharge, no odor, there is itchiness and irritation (inside) x 2 days    HPI:      [name] is a 33 y.o. [id number] who LMP was No LMP recorded. Patient has had a hysterectomy., presents today for yeast vag sx of increased d/c, irritation, burning, no odor, for 2 days. No meds to treat. No recent abx use. No soap change. She is not sex active, no new partners since we last saw her. No urin sx, fevers. Hx of BV and yeast in past. Taking probiotics.   Past Medical History:  Diagnosis Date  . Abnormal Pap smear of cervix 05/01/2016  . Anemia   . Anxiety   . Complication of anesthesia    ITCHING  . Diabetes mellitus without complication (HCC)    WAS ON METFORMIN BUT A1C HAS DECREASED SO MD TOOK PT OFF-LAST A1C ON 03-2017 WAS 5.7  . GERD (gastroesophageal reflux disease)    NO MEDS  . Hyperthyroidism     Past Surgical History:  Procedure Laterality Date  . BARIATRIC SURGERY    . CESAREAN SECTION     X 4  . CHOLECYSTECTOMY    . COLPOSCOPY  06/18/2016  . CYSTOSCOPY N/A 04/15/2017   Procedure: CYSTOSCOPY;  Surgeon: Nadara Mustard, MD;  Location: [facility] ORS;  Service: Gynecology;  Laterality: N/A;  . LAPAROSCOPIC HYSTERECTOMY N/A 04/15/2017   Ovaries remain - Procedure: HYSTERECTOMY TOTAL LAPAROSCOPIC;  Surgeon: Nadara Mustard, MD;  Location: [facility] ORS;  Service: Gynecology;  Laterality: N/A;  . TONSILLECTOMY    . TUBAL LIGATION    . TUBAL LIGATION    . UPPER GASTROINTESTINAL ENDOSCOPY      Family History  Problem Relation Age of Onset  . Ataxia Mother        spinal cerebellar ataxia  . Stroke Father        ischemic  . Ataxia Sister   . Ataxia Brother   . Autism Son   . Autism Son   . Healthy Son   . Developmental delay Son     Social History   Socioeconomic History  . Marital status: Divorced    Spouse name: Not on file  . Number of children: Not  on file  . Years of education: Not on file  . Highest education level: Not on file  Occupational History  . Not on file  Social Needs  . Financial resource strain: Not on file  . Food insecurity:    Worry: Not on file    Inability: Not on file  . Transportation needs:    Medical: Not on file    Non-medical: Not on file  Tobacco Use  . Smoking status: Never Smoker  . Smokeless tobacco: Never Used  Substance and Sexual Activity  . Alcohol use: No  . Drug use: No  . Sexual activity: Not Currently    Birth control/protection: Surgical    Comment: Hysterectomy  Lifestyle  . Physical activity:    Days per week: Not on file    Minutes per session: Not on file  . Stress: Not on file  Relationships  . Social connections:    Talks on phone: Not on file    Gets together: Not on file    Attends religious service: Not on file    Active member of club or organization: Not on file  Attends meetings of clubs or organizations: Not on file    Relationship status: Not on file  . Intimate partner violence:    Fear of current or ex partner: Not on file    Emotionally abused: Not on file    Physically abused: Not on file    Forced sexual activity: Not on file  Other Topics Concern  . Not on file  Social History Narrative  . Not on file    Outpatient Medications Prior to Visit  Medication Sig Dispense Refill  . ciclopirox (PENLAC) 8 % solution APPLY TOPICALLY NIGHTLY APPLY OVER NAIL AND SURROUNDING SKIN.    Marland Kitchen gabapentin (NEURONTIN) 100 MG capsule Take 1 capsule (100 mg total) by mouth at bedtime. 90 capsule 1  . omeprazole (PRILOSEC) 40 MG capsule Take by mouth.    . phentermine (ADIPEX-P) 37.5 MG tablet Take 1 tablet (37.5 mg total) by mouth daily before breakfast. 30 tablet 1  . Probiotic Product (PROBIOTIC PO) Take 1 tablet by mouth daily.    Meryl Dare POWD by Does not apply route.    Marland Kitchen BREWERS YEAST PO Take by mouth.    Marland Kitchen Cod Liver Oil 1000 MG CAPS Take by mouth.    .  predniSONE (DELTASONE) 5 MG tablet 6 DAY TAPER, TAKE AS DIRECTED WITH FOOD     No facility-administered medications prior to visit.       ROS:  Review of Systems  Constitutional: Negative for fever.  Gastrointestinal: Negative for blood in stool, constipation, diarrhea, nausea and vomiting.  Genitourinary: Positive for dysuria, vaginal discharge and vaginal pain. Negative for dyspareunia, flank pain, frequency, hematuria, urgency and vaginal bleeding.  Musculoskeletal: Negative for back pain.  Skin: Negative for rash.    OBJECTIVE:   Vitals:  BP 118/70   Pulse 61   Ht 5\' 6"  (1.676 m)   Wt 270 lb (122.5 kg)   BMI 43.58 kg/m   Physical Exam Vitals signs reviewed.  Constitutional:      Appearance: She is well-developed.  Pulmonary:     Effort: Pulmonary effort is normal.  Genitourinary:    Pubic Area: No rash.      Labia:        Right: Tenderness present. No rash or lesion.        Left: Tenderness present. No rash or lesion.      Vagina: Vaginal discharge present. No erythema or tenderness.     Cervix: Normal.     Uterus: Normal. Not enlarged and not tender.      Adnexa: Right adnexa normal and left adnexa normal.       Right: No mass or tenderness.         Left: No mass or tenderness.       Comments: ERYTHEMA, IRRITATION AT INTROITUS Musculoskeletal: Normal range of motion.  Neurological:     Mental Status: She is alert and oriented to person, place, and time.  Psychiatric:        Behavior: Behavior normal.        Thought Content: Thought content normal.     Results: Results for orders placed or performed in visit on 12/22/18 (from the past 24 hour(s))  POCT Wet Prep with KOH     Status: Abnormal   Collection Time: 12/22/18  3:58 PM  Result Value Ref Range   Trichomonas, UA Negative    Clue Cells Wet Prep HPF POC neg    Epithelial Wet Prep HPF POC     Yeast Wet Prep  HPF POC pos    Bacteria Wet Prep HPF POC     RBC Wet Prep HPF POC     WBC Wet Prep HPF  POC     KOH Prep POC Negative Negative     Assessment/Plan: Candidal vaginitis - Pos sx/wet prep. Rx diflucan per pt pref. Cold compresses/OTC hydrocortisone crm. Cont probiotics.  - Plan: POCT Wet Prep with KOH, fluconazole (DIFLUCAN) 150 MG tablet    Meds ordered this encounter  Medications  . fluconazole (DIFLUCAN) 150 MG tablet    Sig: Take 1 tablet (150 mg total) by mouth once for 1 dose. Then repeat after 4 days    Dispense:  2 tablet    Refill:  0    Order Specific Question:   Supervising Provider    Answer:   Nadara Mustard [[id number]]      Return if symptoms worsen or fail to improve.  Rasheena Talmadge B. Najma Bozarth, PA-C 12/22/2018 4:00 PM

## 2018-12-24 ENCOUNTER — Encounter: Payer: Medicaid Other | Admitting: Physical Therapy

## 2018-12-25 ENCOUNTER — Other Ambulatory Visit: Payer: Self-pay | Admitting: Obstetrics & Gynecology

## 2018-12-25 ENCOUNTER — Telehealth: Payer: Self-pay | Admitting: Obstetrics & Gynecology

## 2018-12-25 MED ORDER — TERCONAZOLE 0.4 % VA CREA: 1.0000 | TOPICAL_CREAM | Freq: Every day | VAGINAL | 0 refills | Status: DC

## 2018-12-25 NOTE — Telephone Encounter (Signed)
Patient seen on 3/3 for yeast infection and given Rx for oral but patient states it is not working and would like cream sent in to her pharmacy [facility] [address].  She saw ABC on the 3rd but ABC not here today, would like another provider to send in if possible.

## 2018-12-29 ENCOUNTER — Encounter: Payer: Self-pay | Admitting: Physical Therapy

## 2018-12-31 ENCOUNTER — Encounter: Payer: Self-pay | Admitting: Physical Therapy

## 2019-01-05 ENCOUNTER — Ambulatory Visit (INDEPENDENT_AMBULATORY_CARE_PROVIDER_SITE_OTHER): Payer: Medicaid Other | Admitting: Nurse Practitioner

## 2019-01-05 ENCOUNTER — Encounter: Payer: Self-pay | Admitting: Physical Therapy

## 2019-01-05 ENCOUNTER — Other Ambulatory Visit: Payer: Self-pay

## 2019-01-05 ENCOUNTER — Other Ambulatory Visit: Payer: Self-pay | Admitting: Student

## 2019-01-05 ENCOUNTER — Other Ambulatory Visit (HOSPITAL_COMMUNITY): Payer: Self-pay | Admitting: Student

## 2019-01-05 ENCOUNTER — Encounter: Payer: Self-pay | Admitting: Nurse Practitioner

## 2019-01-05 VITALS — BP 107/58 | HR 71 | Temp 98.4°F | Ht 66.0 in | Wt 272.0 lb

## 2019-01-05 DIAGNOSIS — R6889 Other general symptoms and signs: Secondary | ICD-10-CM | POA: Diagnosis not present

## 2019-01-05 DIAGNOSIS — R197 Diarrhea, unspecified: Secondary | ICD-10-CM | POA: Diagnosis not present

## 2019-01-05 DIAGNOSIS — R1013 Epigastric pain: Secondary | ICD-10-CM | POA: Diagnosis not present

## 2019-01-05 DIAGNOSIS — K2950 Unspecified chronic gastritis without bleeding: Secondary | ICD-10-CM | POA: Diagnosis not present

## 2019-01-05 DIAGNOSIS — G629 Polyneuropathy, unspecified: Secondary | ICD-10-CM

## 2019-01-05 DIAGNOSIS — K210 Gastro-esophageal reflux disease with esophagitis: Secondary | ICD-10-CM | POA: Diagnosis not present

## 2019-01-05 DIAGNOSIS — D509 Iron deficiency anemia, unspecified: Secondary | ICD-10-CM

## 2019-01-05 NOTE — Progress Notes (Signed)
Subjective:    Patient ID: Crystal Mendez, female    DOB: 11-Dec-1985, 33 y.o.   MRN: [id number]  Crystal Mendez is a 33 y.o. female presenting on 01/05/2019 for Poor Circulation ([name], with the chronicnumbness and tingling in her legs and arms. ); Cold Extremity (pt concern her iron levels are low, because Crystal Mendez stay cold and fatigue. Crystal Mendez also have iron defiency in the past. ); and Edema (hands & feet )   HPI Back pain/ extremity numbness - has not improved and continues to have numbness and tingling in legs. - Patient has pain in leg veins,  - Numbness in legs and arms.  Patient has had metformin in past for prediabetes.  No formal diagnosis of diabetes.    Cold intolerance [name].  Has goosebumps at night, feels tired all the time. Has history of IDA.  Patient has had no pica.  Patient feels very cold all the time.  Swelling of legs and feet   Continues without change from last visit.  Patient admits Crystal Mendez knows weight has contributing factors.  Crystal Mendez is seeing Dr. Chales Salmon at weight loss management in [place].  Is working toward weight loss to help improve swelling.  Patient is changing diet, but not able to lose.  Patient notes swelling is making hands hurt occasionally.  Morbid Obesity Patient stopped phentermine - states Crystal Mendez became emotional, had back to back yeast infections while on it, and unusual heart palpitations.  Since stopping, Crystal Mendez has not had any recurent symptoms.  Social History   Tobacco Use  . Smoking status: Never Smoker  . Smokeless tobacco: Never Used  Substance Use Topics  . Alcohol use: No  . Drug use: No    Review of Systems Per HPI unless specifically indicated above     Objective:    BP (!) 107/58 (BP Location: Left Arm, Patient Position: Sitting, Cuff Size: Large)   Pulse 71   Temp 98.4 F (36.9 C)  (Oral)   Ht 5\' 6"  (1.676 m)   Wt 272 lb (123.4 kg)   BMI 43.90 kg/m   Wt Readings from Last 3 Encounters:  01/05/19 272 lb (123.4 kg)  12/22/18 270 lb (122.5 kg)  12/18/18 268 lb (121.6 kg)    Physical Exam Vitals signs reviewed.  Constitutional:      General: Crystal Mendez is not in acute distress.    Appearance: Crystal Mendez is well-developed.  HENT:     Head: Normocephalic and atraumatic.  Cardiovascular:     Rate and Rhythm: Normal rate and regular rhythm.     Pulses:          Radial pulses are 2+ on the right side and 2+ on the left side.       Posterior tibial pulses are 1+ on the right side and 1+ on the left side.     Heart sounds: Normal heart sounds, S1 normal and S2 normal.  Pulmonary:     Effort: Pulmonary effort is normal. No respiratory distress.     Breath sounds: Normal breath sounds and air entry.  Musculoskeletal:     Right lower leg: No edema.     Left lower leg: No edema.  Skin:    General: Skin is warm and dry.     Capillary Refill: Capillary refill takes less than 2 seconds.  Neurological:     Mental Status:  Crystal Mendez is alert and oriented to person, place, and time.  Psychiatric:        Attention and Perception: Attention normal.        Mood and Affect: Mood and affect normal.        Behavior: Behavior normal. Behavior is cooperative.    Results for orders placed or performed in visit on 12/22/18  POCT Wet Prep with KOH  Result Value Ref Range   Trichomonas, UA Negative    Clue Cells Wet Prep HPF POC neg    Epithelial Wet Prep HPF POC     Yeast Wet Prep HPF POC pos    Bacteria Wet Prep HPF POC     RBC Wet Prep HPF POC     WBC Wet Prep HPF POC     KOH Prep POC Negative Negative      Assessment & Plan:   Problem List Items Addressed This Visit      Other   Iron deficiency anemia - Primary   Relevant Medications   cyanocobalamin (,VITAMIN B-12,) 1000 MCG/ML injection   Other Relevant Orders   CBC with Differential/Platelet   Iron, TIBC and Ferritin Panel     Other Visit Diagnoses    Neuropathy       Relevant Orders   Ambulatory referral to Neurology   Cold intolerance          Neuropathy Uncontrolled and not improved on gabapentin 100 mg once daily. Unknown cause. With family history of neurologic disorders, suggest neurology referral for additional evaluation of possible reversible causes. - Need to screen/eval IDA which could be cause and treat prior to neurology - Referral placed today to neurology to expedite workup after IDA eval. - FOLLOW-UP prn  History IDA, Cold intolerance Status unknown.  IDA could be cause of the entire constellation of patient's symptoms including leg swelling, cold intolerance, neuropathy, and fatigue. Recheck labs.  Continue meds without changes today.  Refills provided. Followup prn after labs.     Follow up plan: Return if symptoms worsen or fail to improve.  [name], DNP, AGPCNP-BC Adult Gerontology Primary Care Nurse Practitioner [facility] 01/05/2019, 3:34 PM

## 2019-01-05 NOTE — Patient Instructions (Addendum)
Crystal Mendez,   Thank you for coming in to clinic today.  1. For numbness and tingling - START increased dose of gabapentin 200 mg at bedtime for 2 weeks. Then, increase to 300 mg at bedtime and continue for at least 4 weeks.  If no change, may stop medication.  2. Referral to neurology.  [facility] - Neurology Dept [address] [place], [place] [address] Phone: [phone]  Continue working toward weight loss.  We will let you know about anemia.  Anemia can also cause tingling, numbness, and swelling.  You will be due for non- FASTING BLOOD WORK.  This means you should eat no food or drink after midnight.  Drink only water or coffee without cream/sugar on the morning of your lab visit. - Please go ahead and schedule a "Lab Only" visit in the morning at the clinic for lab draw in the next 7 days. - Your results will be available about 2-3 days after blood draw.  If you have set up a MyChart account, you can can log in to MyChart online to view your results and a brief explanation. Also, we can discuss your results together at your next office visit if you would like.    Please schedule a follow-up appointment with [name], AGNP. Return if symptoms worsen or fail to improve.  If you have any other questions or concerns, please feel free to call the clinic or send a message through MyChart. You may also schedule an earlier appointment if necessary.  You will receive a survey after today's visit either digitally by e-mail or paper by [employer]. Your experiences and feedback matter to [place].  Please respond so we know how we are doing as we provide care for you.   [name], DNP, AGNP-BC Adult Gerontology Nurse Practitioner [facility], [facility]

## 2019-01-07 ENCOUNTER — Encounter: Payer: Self-pay | Admitting: Physical Therapy

## 2019-01-08 ENCOUNTER — Encounter: Payer: Self-pay | Admitting: Nurse Practitioner

## 2019-01-12 ENCOUNTER — Encounter: Payer: Self-pay | Admitting: Physical Therapy

## 2019-01-12 DIAGNOSIS — D509 Iron deficiency anemia, unspecified: Secondary | ICD-10-CM | POA: Diagnosis not present

## 2019-01-13 ENCOUNTER — Other Ambulatory Visit: Payer: Self-pay | Admitting: Obstetrics & Gynecology

## 2019-01-13 LAB — IRON,TIBC AND FERRITIN PANEL
%SAT: 14 % (calc) — ABNORMAL LOW (ref 16–45)
Ferritin: 32 ng/mL (ref 16–154)
Iron: 39 ug/dL — ABNORMAL LOW (ref 40–190)
TIBC: 285 mcg/dL (calc) (ref 250–450)

## 2019-01-13 LAB — CBC WITH DIFFERENTIAL/PLATELET
Absolute Monocytes: 266 cells/uL (ref 200–950)
Basophils Absolute: 59 cells/uL (ref 0–200)
Basophils Relative: 0.8 %
Eosinophils Absolute: 118 cells/uL (ref 15–500)
Eosinophils Relative: 1.6 %
HCT: 37.5 % (ref 35.0–45.0)
Hemoglobin: 12.1 g/dL (ref 11.7–15.5)
Lymphs Abs: 3360 cells/uL (ref 850–3900)
MCH: 27.6 pg (ref 27.0–33.0)
MCHC: 32.3 g/dL (ref 32.0–36.0)
MCV: 85.6 fL (ref 80.0–100.0)
MPV: 10.9 fL (ref 7.5–12.5)
Monocytes Relative: 3.6 %
Neutro Abs: 3596 cells/uL (ref 1500–7800)
Neutrophils Relative %: 48.6 %
Platelets: 398 10*3/uL (ref 140–400)
RBC: 4.38 10*6/uL (ref 3.80–5.10)
RDW: 13.1 % (ref 11.0–15.0)
Total Lymphocyte: 45.4 %
WBC: 7.4 10*3/uL (ref 3.8–10.8)

## 2019-01-13 NOTE — Telephone Encounter (Signed)
You had just treated this patient for a yeast infection earlier this month.

## 2019-01-13 NOTE — Telephone Encounter (Signed)
please advise.

## 2019-01-14 ENCOUNTER — Encounter: Payer: Self-pay | Admitting: Obstetrics & Gynecology

## 2019-01-14 ENCOUNTER — Other Ambulatory Visit: Payer: Self-pay | Admitting: Obstetrics & Gynecology

## 2019-01-14 ENCOUNTER — Encounter: Payer: Self-pay | Admitting: Physical Therapy

## 2019-01-14 DIAGNOSIS — R269 Unspecified abnormalities of gait and mobility: Secondary | ICD-10-CM | POA: Diagnosis not present

## 2019-01-14 DIAGNOSIS — R202 Paresthesia of skin: Secondary | ICD-10-CM | POA: Diagnosis not present

## 2019-01-14 DIAGNOSIS — R200 Anesthesia of skin: Secondary | ICD-10-CM | POA: Diagnosis not present

## 2019-01-14 DIAGNOSIS — E559 Vitamin D deficiency, unspecified: Secondary | ICD-10-CM | POA: Diagnosis not present

## 2019-01-14 MED ORDER — NALTREXONE-BUPROPION HCL ER 8-90 MG PO TB12: ORAL_TABLET | ORAL | 0 refills | Status: DC

## 2019-01-14 MED ORDER — TERCONAZOLE 0.4 % VA CREA: 1.0000 | TOPICAL_CREAM | Freq: Every day | VAGINAL | 0 refills | Status: DC

## 2019-01-15 ENCOUNTER — Other Ambulatory Visit: Payer: Self-pay

## 2019-01-15 ENCOUNTER — Ambulatory Visit (INDEPENDENT_AMBULATORY_CARE_PROVIDER_SITE_OTHER): Payer: Medicaid Other

## 2019-01-15 ENCOUNTER — Ambulatory Visit: Payer: Medicaid Other

## 2019-01-15 DIAGNOSIS — E538 Deficiency of other specified B group vitamins: Secondary | ICD-10-CM | POA: Diagnosis not present

## 2019-01-15 MED ORDER — CYANOCOBALAMIN 1000 MCG/ML IJ SOLN
1000.0000 ug | Freq: Once | INTRAMUSCULAR | Status: AC
  Administered 2019-01-15: 1000 ug via INTRAMUSCULAR

## 2019-01-18 ENCOUNTER — Other Ambulatory Visit: Payer: Self-pay | Admitting: Neurology

## 2019-01-18 DIAGNOSIS — R202 Paresthesia of skin: Secondary | ICD-10-CM | POA: Diagnosis not present

## 2019-01-18 DIAGNOSIS — R200 Anesthesia of skin: Secondary | ICD-10-CM

## 2019-01-18 DIAGNOSIS — R269 Unspecified abnormalities of gait and mobility: Secondary | ICD-10-CM

## 2019-01-18 DIAGNOSIS — E559 Vitamin D deficiency, unspecified: Secondary | ICD-10-CM | POA: Diagnosis not present

## 2019-01-19 ENCOUNTER — Other Ambulatory Visit: Payer: Self-pay | Admitting: Obstetrics & Gynecology

## 2019-01-19 DIAGNOSIS — E559 Vitamin D deficiency, unspecified: Secondary | ICD-10-CM

## 2019-01-27 DIAGNOSIS — R7303 Prediabetes: Secondary | ICD-10-CM | POA: Diagnosis not present

## 2019-01-27 DIAGNOSIS — K219 Gastro-esophageal reflux disease without esophagitis: Secondary | ICD-10-CM | POA: Diagnosis not present

## 2019-01-28 DIAGNOSIS — R7303 Prediabetes: Secondary | ICD-10-CM | POA: Diagnosis not present

## 2019-01-28 DIAGNOSIS — D509 Iron deficiency anemia, unspecified: Secondary | ICD-10-CM | POA: Diagnosis not present

## 2019-01-28 DIAGNOSIS — Z6841 Body Mass Index (BMI) 40.0 and over, adult: Secondary | ICD-10-CM | POA: Diagnosis not present

## 2019-01-28 DIAGNOSIS — E669 Obesity, unspecified: Secondary | ICD-10-CM | POA: Diagnosis not present

## 2019-01-28 DIAGNOSIS — E559 Vitamin D deficiency, unspecified: Secondary | ICD-10-CM | POA: Diagnosis not present

## 2019-02-10 ENCOUNTER — Ambulatory Visit: Admission: RE | Admit: 2019-02-10 | Payer: Medicaid Other | Source: Ambulatory Visit

## 2019-02-17 ENCOUNTER — Ambulatory Visit: Payer: Medicaid Other | Admitting: Obstetrics & Gynecology

## 2019-02-19 ENCOUNTER — Encounter: Payer: Self-pay | Admitting: Family Medicine

## 2019-02-19 ENCOUNTER — Other Ambulatory Visit: Payer: Self-pay

## 2019-02-19 ENCOUNTER — Ambulatory Visit (INDEPENDENT_AMBULATORY_CARE_PROVIDER_SITE_OTHER): Payer: Medicaid Other | Admitting: Family Medicine

## 2019-02-19 ENCOUNTER — Ambulatory Visit: Payer: Medicaid Other | Admitting: Obstetrics & Gynecology

## 2019-02-19 DIAGNOSIS — T3695XA Adverse effect of unspecified systemic antibiotic, initial encounter: Secondary | ICD-10-CM

## 2019-02-19 DIAGNOSIS — L03032 Cellulitis of left toe: Secondary | ICD-10-CM | POA: Diagnosis not present

## 2019-02-19 DIAGNOSIS — B379 Candidiasis, unspecified: Secondary | ICD-10-CM

## 2019-02-19 DIAGNOSIS — L600 Ingrowing nail: Secondary | ICD-10-CM

## 2019-02-19 MED ORDER — FLUCONAZOLE 150 MG PO TABS: ORAL_TABLET | ORAL | 0 refills | Status: DC

## 2019-02-19 MED ORDER — TRIAMCINOLONE ACETONIDE 0.5 % EX CREA: 1.0000 "application " | TOPICAL_CREAM | Freq: Two times a day (BID) | CUTANEOUS | 0 refills | Status: DC

## 2019-02-19 MED ORDER — SULFAMETHOXAZOLE-TRIMETHOPRIM 800-160 MG PO TABS: 1.0000 | ORAL_TABLET | Freq: Two times a day (BID) | ORAL | 0 refills | Status: AC

## 2019-02-19 NOTE — Progress Notes (Signed)
Subjective:    Patient ID: Crystal Mendez, female    DOB: 28-Jan-1986, 33 y.o.   MRN: [id number]  Crystal Mendez is a 33 y.o. female presenting on 02/19/2019 for Toe Pain (left toe, infected onset week denies fever, but very painful)  Virtual / Telehealth Encounter - Video Visit via [email or web address] The purpose of this virtual visit is to provide medical care while limiting exposure to the novel coronavirus (COVID19) for both patient and office staff.  Consent was obtained for remote visit:  Yes.   Answered questions that patient had about telehealth interaction:  Yes.   I discussed the limitations, risks, security and privacy concerns of performing an evaluation and management service by video/telephone. I also discussed with the patient that there may be a patient responsible charge related to this service. The patient expressed understanding and agreed to proceed.  Patient Location: Parked car. Provider Location: [facility] (Office)  PCP is [name], AGPCNP-BC - I am currently covering during her maternity leave.   HPI   LEFT Great Toe paronychia, infection Reports new problem onset, 1-2 weeks, thought she may have had a fungal infection, it did not improve, she had some hangnails, ultimately developed swelling and pain, and thought she had infection. She used peroxide without improvement. Skin peeled some around her cuticle by her nailbed. Feels swollen. - She has not had any trauma or injury or pedicure or other provoking cause of toenail infection Admits drainage of some pus Admits pain Denies any fever chills sweats nausea vomiting, spreading redness away from toenail into toe or foot  Depression screen PHQ 2/9 02/19/2019  Decreased Interest 0  Down, Depressed, Hopeless 0  PHQ - 2 Score 0    Social History   Tobacco Use  . Smoking status: Never Smoker  . Smokeless tobacco: Never Used  Substance Use Topics  . Alcohol use: No  . Drug use: No     Review of Systems Per HPI unless specifically indicated above     Objective:    There were no vitals taken for this visit.  Wt Readings from Last 3 Encounters:  01/05/19 272 lb (123.4 kg)  12/22/18 270 lb (122.5 kg)  12/18/18 268 lb (121.6 kg)    Physical Exam   Note examination was completely remotely via video observation objective data only  Gen - well-appearing, no acute distress or apparent pain, comfortable HEENT - eyes appear clear without discharge or redness Heart/Lungs - cannot examine virtually - observed no evidence of coughing or labored breathing. Skin - Left great toe visualized on video - with medial aspect likely ingrown toenail with some scablike dried debris and localized swelling of nailbed with some superficial skin slough without erythema or drainage. Neuro - awake, alert, oriented Psych - not anxious appearing   Results for orders placed or performed in visit on 01/05/19  CBC with Differential/Platelet  Result Value Ref Range   WBC 7.4 3.8 - 10.8 Thousand/uL   RBC 4.38 3.80 - 5.10 Million/uL   Hemoglobin 12.1 11.7 - 15.5 g/dL   HCT 29.9 37.1 - 69.6 %   MCV 85.6 80.0 - 100.0 fL   MCH 27.6 27.0 - 33.0 pg   MCHC 32.3 32.0 - 36.0 g/dL   RDW 78.9 38.1 - 01.7 %   Platelets 398 140 - 400 Thousand/uL   MPV 10.9 7.5 - 12.5 fL   Neutro Abs 3,596 1,500 - 7,800 cells/uL   Lymphs Abs 3,360 850 - 3,900 cells/uL  Absolute Monocytes 266 200 - 950 cells/uL   Eosinophils Absolute 118 15 - 500 cells/uL   Basophils Absolute 59 0 - 200 cells/uL   Neutrophils Relative % 48.6 %   Total Lymphocyte 45.4 %   Monocytes Relative 3.6 %   Eosinophils Relative 1.6 %   Basophils Relative 0.8 %  Iron, TIBC and Ferritin Panel  Result Value Ref Range   Iron 39 (L) 40 - 190 mcg/dL   TIBC 161285 096250 - 045450 mcg/dL (calc)   %SAT 14 (L) 16 - 45 % (calc)   Ferritin 32 16 - 154 ng/mL      Assessment & Plan:   Problem List Items Addressed This Visit    None    Visit Diagnoses     Paronychia of toe of left foot due to ingrown toenail    -  Primary   Relevant Medications   sulfamethoxazole-trimethoprim (BACTRIM DS) 800-160 MG tablet   fluconazole (DIFLUCAN) 150 MG tablet   triamcinolone cream (KENALOG) 0.5 %   Antibiotic-induced yeast infection       Relevant Medications   sulfamethoxazole-trimethoprim (BACTRIM DS) 800-160 MG tablet   fluconazole (DIFLUCAN) 150 MG tablet      Ingrown Left great toenail medial aspect, with visual and historical evidencecomplicated by acute paronychia infection with fluctuance and drainage of pus. Afebrile without systemic symptoms or extending cellulitis. Neurovascular intact. - No prior toenail removal.   Plan: 1. Start Bactrim-DS BID x 7 days - Rx Triamcinolone PRN for inflammation 2. Recommend warm water soaks to assist drainage several times a day for now 3. Ibuprofen PRN 4. IF NOT improving - can refer [facility] for evaluation, consider partial toenail removal 5. Reviewed aftercare instructions and avoiding recurrent ingrown toenail 6. Return criteria given. Follow-up PRN  If acute worsening - within 24-48 hours strict instructions to seek care promptly, recommend [facility] may warrant I&D, nerve block or partial toenail removal   Meds ordered this encounter  Medications  . sulfamethoxazole-trimethoprim (BACTRIM DS) 800-160 MG tablet    Sig: Take 1 tablet by mouth 2 (two) times daily for 7 days.    Dispense:  14 tablet    Refill:  0  . fluconazole (DIFLUCAN) 150 MG tablet    Sig: If yeast infection - Take one tablet by mouth on Day 1. Repeat dose 2nd tablet on Day 3.    Dispense:  2 tablet    Refill:  0  . triamcinolone cream (KENALOG) 0.5 %    Sig: Apply 1 application topically 2 (two) times daily. To affected area for up to 2 weeks - for toe inflammation    Dispense:  30 g    Refill:  0    Follow-up: - Return in 3-5 days as needed if not improved  Patient verbalizes  understanding with the above medical recommendations including the limitation of remote medical advice.  Specific follow-up and call-back criteria were given for patient to follow-up or seek medical care more urgently if needed.  Total duration of direct patient care provided via video conference: 18 minutes  Saralyn PilarAlexander Karamalegos, DO [facility] 02/19/2019, 2:29 PM

## 2019-02-19 NOTE — Patient Instructions (Addendum)
You have an Ingrown Toenail that has caused an Infection of the nailbed.  Antibiotic - Start with Bactrim-DS 1 pill twice a day for 7 days  May use topical cream steroid - for reducing inflammation and swelling twice a day as needed - Do not use directly over an open sore or open cut. Only use on surface of skin  May STOP using peroxide - switch to topical antibiotic ointment  - To help it drain pus and heal, recommend warm water soaks for 10-15 min at a time several times a day initially   If not improved we can refer you to Podiatry  [facility] Address: [address], [place], [place] [address] Hours: Open 8AM-5PM Phone: [phone]  You may need partial toenail removal as discussed. Occasionally the toenail will return to being ingrown as it grows back, important to help prevent.  If you get worsening pain, swelling or redness that spreads down toe into foot, or fevers / nausea, vomiting, unable to keep pills down, then may need to follow-up in the office or if severe worsening, may need to go to the Emergency Department for spreading skin infection.  IF TOE DOES NOT IMPROVE IN 24-48 HOURS OR SOONER  Please seek additional care over the weekend  [facility]  Address: [address], [place], [place] [address]  Phone: [phone]   Please schedule a Follow-up Appointment to: Return in about 1 week (around 02/26/2019), or if symptoms worsen or fail to improve, for toenail infection.  If you have any other questions or concerns, please feel free to call the office or send a message through MyChart. You may also schedule an earlier appointment if necessary.  Additionally, you may be receiving a survey about your experience at our office within a few days to 1 week by e-mail or mail. We value your feedback.  Saralyn Pilar, DO [facility], [place]

## 2019-02-23 ENCOUNTER — Other Ambulatory Visit: Payer: Self-pay

## 2019-02-23 ENCOUNTER — Ambulatory Visit (HOSPITAL_COMMUNITY)
Admission: EM | Admit: 2019-02-23 | Discharge: 2019-02-23 | Discharge status: Against Medical Advice | Payer: Medicaid Other

## 2019-02-26 ENCOUNTER — Ambulatory Visit: Payer: Medicaid Other | Admitting: Obstetrics & Gynecology

## 2019-03-04 DIAGNOSIS — Z6841 Body Mass Index (BMI) 40.0 and over, adult: Secondary | ICD-10-CM | POA: Diagnosis not present

## 2019-03-04 DIAGNOSIS — I10 Essential (primary) hypertension: Secondary | ICD-10-CM | POA: Diagnosis not present

## 2019-03-04 DIAGNOSIS — R7303 Prediabetes: Secondary | ICD-10-CM | POA: Diagnosis not present

## 2019-03-04 DIAGNOSIS — R635 Abnormal weight gain: Secondary | ICD-10-CM | POA: Diagnosis not present

## 2019-03-16 DIAGNOSIS — Z713 Dietary counseling and surveillance: Secondary | ICD-10-CM | POA: Diagnosis not present

## 2019-03-23 ENCOUNTER — Ambulatory Visit: Admission: RE | Admit: 2019-03-23 | Payer: Medicaid Other | Source: Ambulatory Visit

## 2019-04-09 DIAGNOSIS — I10 Essential (primary) hypertension: Secondary | ICD-10-CM | POA: Diagnosis not present

## 2019-04-09 DIAGNOSIS — Z6841 Body Mass Index (BMI) 40.0 and over, adult: Secondary | ICD-10-CM | POA: Diagnosis not present

## 2019-04-09 DIAGNOSIS — R7303 Prediabetes: Secondary | ICD-10-CM | POA: Diagnosis not present

## 2019-04-20 DIAGNOSIS — Z713 Dietary counseling and surveillance: Secondary | ICD-10-CM | POA: Diagnosis not present

## 2019-04-27 ENCOUNTER — Ambulatory Visit: Admission: RE | Admit: 2019-04-27 | Payer: Medicaid Other | Source: Ambulatory Visit

## 2019-05-25 DIAGNOSIS — Z713 Dietary counseling and surveillance: Secondary | ICD-10-CM | POA: Diagnosis not present

## 2019-07-19 ENCOUNTER — Other Ambulatory Visit: Payer: Self-pay

## 2019-07-19 ENCOUNTER — Emergency Department: Payer: Medicaid Other

## 2019-07-19 ENCOUNTER — Emergency Department
Admission: EM | Admit: 2019-07-19 | Discharge: 2019-07-19 | Disposition: A | Discharge status: Home / Self Care | Payer: Medicaid Other | Attending: Emergency Medicine | Admitting: Emergency Medicine

## 2019-07-19 ENCOUNTER — Encounter: Payer: Self-pay | Admitting: *Deleted

## 2019-07-19 DIAGNOSIS — Z9884 Bariatric surgery status: Secondary | ICD-10-CM | POA: Diagnosis not present

## 2019-07-19 DIAGNOSIS — E039 Hypothyroidism, unspecified: Secondary | ICD-10-CM | POA: Insufficient documentation

## 2019-07-19 DIAGNOSIS — Y999 Unspecified external cause status: Secondary | ICD-10-CM | POA: Insufficient documentation

## 2019-07-19 DIAGNOSIS — S99921A Unspecified injury of right foot, initial encounter: Secondary | ICD-10-CM | POA: Diagnosis not present

## 2019-07-19 DIAGNOSIS — Z79899 Other long term (current) drug therapy: Secondary | ICD-10-CM | POA: Diagnosis not present

## 2019-07-19 DIAGNOSIS — S8991XA Unspecified injury of right lower leg, initial encounter: Secondary | ICD-10-CM | POA: Diagnosis not present

## 2019-07-19 DIAGNOSIS — S80211A Abrasion, right knee, initial encounter: Secondary | ICD-10-CM | POA: Diagnosis not present

## 2019-07-19 DIAGNOSIS — M79661 Pain in right lower leg: Secondary | ICD-10-CM

## 2019-07-19 DIAGNOSIS — Y939 Activity, unspecified: Secondary | ICD-10-CM | POA: Diagnosis not present

## 2019-07-19 DIAGNOSIS — M79671 Pain in right foot: Secondary | ICD-10-CM | POA: Diagnosis not present

## 2019-07-19 DIAGNOSIS — E119 Type 2 diabetes mellitus without complications: Secondary | ICD-10-CM | POA: Diagnosis not present

## 2019-07-19 DIAGNOSIS — Y92512 Supermarket, store or market as the place of occurrence of the external cause: Secondary | ICD-10-CM | POA: Insufficient documentation

## 2019-07-19 DIAGNOSIS — W010XXA Fall on same level from slipping, tripping and stumbling without subsequent striking against object, initial encounter: Secondary | ICD-10-CM | POA: Insufficient documentation

## 2019-07-19 MED ORDER — KETOROLAC TROMETHAMINE 30 MG/ML IJ SOLN
30.0000 mg | Freq: Once | INTRAMUSCULAR | Status: AC
  Administered 2019-07-19: 20:00:00 30 mg via INTRAMUSCULAR
  Filled 2019-07-19: qty 1

## 2019-07-19 MED ORDER — TRAMADOL HCL 50 MG PO TABS: 50.0000 mg | ORAL_TABLET | Freq: Four times a day (QID) | ORAL | 0 refills | Status: DC | PRN

## 2019-07-19 MED ORDER — IBUPROFEN 600 MG PO TABS: 600.0000 mg | ORAL_TABLET | Freq: Four times a day (QID) | ORAL | 0 refills | Status: DC | PRN

## 2019-07-19 MED ORDER — OXYCODONE-ACETAMINOPHEN 5-325 MG PO TABS
1.0000 | ORAL_TABLET | Freq: Once | ORAL | Status: AC
  Administered 2019-07-19: 1 via ORAL
  Filled 2019-07-19: qty 1

## 2019-07-19 NOTE — Discharge Instructions (Addendum)
Your x-rays do not show any broken bones.  Please wear knee immobilizer and postop shoe or use an ace wrap.  Limit weightbearing to right leg and use crutches.  Ice and elevate leg this week.  You can take ibuprofen for pain and inflammation and tramadol for extreme pain.  Please call primary care or orthopedics tomorrow for an appointment for follow-up.

## 2019-07-19 NOTE — ED Triage Notes (Signed)
Pt fell in [place] today.  Pt has right knee and right lower leg pain.  Pt has icepack to knee. Pt alert.

## 2019-07-19 NOTE — ED Notes (Signed)
See triage note   States she slipped in water at [facility] on right knee  Abrasion and swelling note to knee   Unable to bear wt

## 2019-07-19 NOTE — ED Provider Notes (Signed)
[facility] Emergency Department Provider Note  ____________________________________________  Time seen: Approximately 6:51 PM  I have reviewed the triage vital signs and the nursing notes.   HISTORY  Chief Complaint Knee Pain    HPI Crystal Mendez is a 33 y.o. female that presents to the emergency department for evaluation of right knee, shin, and foot pain after falling at [facility] several hours ago.  Patient states that she slipped on a puddle of water in the store.  Pain is worse in her knee and her foot.  She is having difficulty moving the knee and foot and putting weight on her right leg due to pain.  She did not hit her head or lose consciousness.  No additional injuries.   Past Medical History:  Diagnosis Date  . Abnormal Pap smear of cervix 05/01/2016  . Anemia   . Anxiety   . Complication of anesthesia    ITCHING  . Diabetes mellitus without complication (HCC)    WAS ON METFORMIN BUT A1C HAS DECREASED SO MD TOOK PT OFF-LAST A1C ON 03-2017 WAS 5.7  . GERD (gastroesophageal reflux disease)    NO MEDS  . Hyperthyroidism     Patient Active Problem List   Diagnosis Date Noted  . Type 2 diabetes mellitus without complication, without long-term current use of insulin ([place]) 12/18/2018  . Other acne 10/28/2018  . Hirsutism 10/28/2018  . Iron deficiency anemia 09/05/2016  . Elevated BP without diagnosis of hypertension 08/05/2016  . Gastroesophageal reflux disease without esophagitis 08/05/2016  . Obesity, Class III, BMI 40-49.9 (morbid obesity) ([place]) 08/05/2016  . Prediabetes 08/05/2016  . History of cervical dysplasia 06/19/2016    Past Surgical History:  Procedure Laterality Date  . BARIATRIC SURGERY    . CESAREAN SECTION     X 4  . CHOLECYSTECTOMY    . COLPOSCOPY  06/18/2016  . CYSTOSCOPY N/A 04/15/2017   Procedure: CYSTOSCOPY;  Surgeon: Gae Dry, MD;  Location: [facility] ORS;  Service: Gynecology;  Laterality: N/A;  .  LAPAROSCOPIC HYSTERECTOMY N/A 04/15/2017   Ovaries remain - Procedure: HYSTERECTOMY TOTAL LAPAROSCOPIC;  Surgeon: Gae Dry, MD;  Location: [facility] ORS;  Service: Gynecology;  Laterality: N/A;  . TONSILLECTOMY    . TUBAL LIGATION    . TUBAL LIGATION    . UPPER GASTROINTESTINAL ENDOSCOPY      Prior to Admission medications   Medication Sig Start Date End Date Taking? Authorizing Provider  ciclopirox (PENLAC) 8 % solution APPLY TOPICALLY NIGHTLY APPLY OVER NAIL AND SURROUNDING SKIN. 07/17/18   [provider]  cyanocobalamin (,VITAMIN B-12,) 1000 MCG/ML injection INJECT 1 ML (1,000 MCG TOTAL) INTO THE MUSCLE ONCE FOR 1 DOSE. REPEAT MONTHLY. 12/18/18   [provider]  fluconazole (DIFLUCAN) 150 MG tablet If yeast infection - Take one tablet by mouth on Day 1. Repeat dose 2nd tablet on Day 3. 02/19/19   Parks Ranger, Devonne Doughty, DO  gabapentin (NEURONTIN) 100 MG capsule Take 1 capsule (100 mg total) by mouth at bedtime. 11/10/18   Karamalegos, Devonne Doughty, DO  ibuprofen (ADVIL) 600 MG tablet Take 1 tablet (600 mg total) by mouth every 6 (six) hours as needed. 07/19/19   Laban Emperor, PA-C  omeprazole (PRILOSEC) 40 MG capsule Take by mouth. 11/17/18 02/15/19  [provider]  Probiotic Product (PROBIOTIC PO) Take 1 tablet by mouth daily.    [provider]  sucralfate (CARAFATE) 1 g tablet Take by mouth. 01/05/19 07/04/19  [provider]  terconazole Lonn Georgia  7) 0.4 % vaginal cream Place 1 applicator vaginally at bedtime. 01/14/19   Copland, Ilona Sorrel, PA-C  traMADol (ULTRAM) 50 MG tablet Take 1 tablet (50 mg total) by mouth every 6 (six) hours as needed. 07/19/19 07/18/20  Enid Derry, PA-C  triamcinolone cream (KENALOG) 0.5 % Apply 1 application topically 2 (two) times daily. To affected area for up to 2 weeks - for toe inflammation 02/19/19   Smitty Cords, DO    Allergies Eggs or egg-derived products and Lactose  Family History  Problem  Relation Age of Onset  . Ataxia Mother        spinal cerebellar ataxia  . Stroke Father        ischemic  . Ataxia Sister   . Ataxia Brother   . Autism Son   . Autism Son   . Healthy Son   . Developmental delay Son     Social History Social History   Tobacco Use  . Smoking status: Never Smoker  . Smokeless tobacco: Never Used  Substance Use Topics  . Alcohol use: No  . Drug use: No     Review of Systems  Cardiovascular: No chest pain. Respiratory: No SOB. Gastrointestinal: No nausea, no vomiting.  Musculoskeletal: Positive for knee, shin, foot pain. Skin: Negative for rash, abrasions, lacerations, ecchymosis. Neurological: Negative for numbness or tingling   ____________________________________________   PHYSICAL EXAM:  VITAL SIGNS: ED Triage Vitals  Enc Vitals Group     BP 07/19/19 1728 128/70     Pulse Rate 07/19/19 1728 63     Resp 07/19/19 1728 18     Temp 07/19/19 1728 98.9 F (37.2 C)     Temp Source 07/19/19 1728 Oral     SpO2 07/19/19 1728 98 %     Weight --      Height --      Head Circumference --      Peak Flow --      Pain Score 07/19/19 1729 8     Pain Loc --      Pain Edu? --      Excl. in GC? --      Constitutional: Alert and oriented. Well appearing and in no acute distress. Eyes: Conjunctivae are normal. PERRL. EOMI. Head: Atraumatic. ENT:      Ears:      Nose: No congestion/rhinnorhea.      Mouth/Throat: Mucous membranes are moist.  Neck: No stridor.   Cardiovascular: Good peripheral circulation.  Strong pedal pulses.  Cap refill less than 2 seconds. Respiratory: Normal respiratory effort without tachypnea or retractions.  Musculoskeletal: Full range of motion to all extremities. No gross deformities appreciated.  Pain elicited with extension of right knee.  Pain improves with flexion of right knee.  Tenderness to palpation to right patella.  Tenderness to palpation to right shin.  No calf tenderness.  Tenderness to palpation  throughout right foot.  No swelling or ecchymosis observed. Neurologic:  Normal speech and language. No gross focal neurologic deficits are appreciated.  Skin:  Skin is warm, dry and intact. No rash noted. Psychiatric: Mood and affect are normal. Speech and behavior are normal. Patient exhibits appropriate insight and judgement.   ____________________________________________   LABS (all labs ordered are listed, but only abnormal results are displayed)  Labs Reviewed - No data to display ____________________________________________  EKG   ____________________________________________  RADIOLOGY Lexine Baton, personally viewed and evaluated these images (plain radiographs) as part of my medical decision making, as well  as reviewing the written report by the radiologist.  Dg Tibia/fibula Right  Result Date: 07/19/2019 CLINICAL DATA:  Pt fell in [employer] today. Pt has right knee and right lower leg pain. Pt has icepack to knee. Pt alert. no hx of the same EXAM: RIGHT TIBIA AND FIBULA - 2 VIEW COMPARISON:  None. FINDINGS: There is no evidence of fracture or other focal bone lesions. Alignment maintained at the knee and ankle joints. Soft tissues are unremarkable. IMPRESSION: No acute osseous abnormality in the right tibia or fibula. Electronically Signed   By: Emmaline KluverNancy  Ballantyne M.D.   On: 07/19/2019 19:26   Dg Knee Complete 4 Views Right  Result Date: 07/19/2019 CLINICAL DATA:  [facility], landing on right knee. Pain over patella and lateral knee. Abrasion and swelling note to knee. Unable to bear wt. EXAM: RIGHT KNEE - COMPLETE 4+ VIEW COMPARISON:  None. FINDINGS: No evidence of fracture, dislocation, or joint effusion. No evidence of arthropathy or other focal bone abnormality. Soft tissues are unremarkable. IMPRESSION: No acute osseous abnormality in the right knee. Electronically Signed   By: Emmaline KluverNancy  Ballantyne M.D.   On: 07/19/2019 18:57   Dg  Foot 2 Views Right  Result Date: 07/19/2019 CLINICAL DATA:  Pt fell in [employer] today. Pt has right knee and right lower leg pain. Pt has icepack to knee. Pt alert. no hx of the same EXAM: RIGHT FOOT - 2 VIEW COMPARISON:  None. FINDINGS: There is no evidence of fracture or dislocation. There is no evidence of arthropathy or other focal bone abnormality. Soft tissues are unremarkable. IMPRESSION: Negative right foot radiographs. Electronically Signed   By: Emmaline KluverNancy  Ballantyne M.D.   On: 07/19/2019 19:27    ____________________________________________    PROCEDURES  Procedure(s) performed:    Procedures    Medications  oxyCODONE-acetaminophen (PERCOCET/ROXICET) 5-325 MG per tablet 1 tablet (1 tablet Oral Given 07/19/19 1855)  ketorolac (TORADOL) 30 MG/ML injection 30 mg (30 mg Intramuscular Given 07/19/19 2019)     ____________________________________________   INITIAL IMPRESSION / ASSESSMENT AND PLAN / ED COURSE  Pertinent labs & imaging results that were available during my care of the patient were reviewed by me and considered in my medical decision making (see chart for details).  Review of the [place] CSRS was performed in accordance of the [employer] prior to dispensing any controlled drugs.   Patient presents emergency department for evaluation after fall.  Vital signs and exam are reassuring.  X-rays are negative for acute bony abnormality.  Pain improved with Percocet.  Patient requests splints for her knee and her foot.  Knee immobilizer was placed and postop shoe was given.  Crutches were given.  Patient will be discharged home with prescriptions for motrin and a short course of tramadol. Patient is to follow up with PCP or ortho as directed.  Referral was given to Dr. Joice LoftsPoggi.  Patient is given ED precautions to return to the ED for any worsening or new symptoms.  Maryjean MornShequila L Fawley was evaluated in Emergency Department on 07/19/2019 for the symptoms described in the history of present  illness. She was evaluated in the context of the global COVID-19 pandemic, which necessitated consideration that the patient might be at risk for infection with the SARS-CoV-2 virus that causes COVID-19. Institutional protocols and algorithms that pertain to the evaluation of patients at risk for COVID-19 are in a state of rapid change based on information released by regulatory bodies including the CDC  and federal and state organizations. These policies and algorithms were followed during the patient's care in the ED.   ____________________________________________  FINAL CLINICAL IMPRESSION(S) / ED DIAGNOSES  Final diagnoses:  Injury of right lower extremity, initial encounter  Pain of right lower leg      NEW MEDICATIONS STARTED DURING THIS VISIT:  ED Discharge Orders         Ordered    ibuprofen (ADVIL) 600 MG tablet  Every 6 hours PRN     07/19/19 2005    traMADol (ULTRAM) 50 MG tablet  Every 6 hours PRN     07/19/19 2005              This chart was dictated using voice recognition software/Dragon. Despite best efforts to proofread, errors can occur which can change the meaning. Any change was purely unintentional.    Enid Derry, PA-C 07/19/19 2142    Phineas Semen, MD 07/20/19 [phone]

## 2019-07-19 NOTE — ED Notes (Signed)
Patient appears drowsy. Patient's husband was contacted on hospital ascom and given to patient to use.

## 2019-07-21 DIAGNOSIS — Z6841 Body Mass Index (BMI) 40.0 and over, adult: Secondary | ICD-10-CM | POA: Diagnosis not present

## 2019-07-22 ENCOUNTER — Other Ambulatory Visit: Payer: Self-pay | Admitting: Surgical Oncology

## 2019-07-22 ENCOUNTER — Encounter: Payer: Self-pay | Admitting: Nurse Practitioner

## 2019-07-22 ENCOUNTER — Other Ambulatory Visit: Payer: Self-pay

## 2019-07-22 ENCOUNTER — Ambulatory Visit (INDEPENDENT_AMBULATORY_CARE_PROVIDER_SITE_OTHER): Payer: Medicaid Other | Admitting: Nurse Practitioner

## 2019-07-22 DIAGNOSIS — K219 Gastro-esophageal reflux disease without esophagitis: Secondary | ICD-10-CM

## 2019-07-22 DIAGNOSIS — M79604 Pain in right leg: Secondary | ICD-10-CM

## 2019-07-22 DIAGNOSIS — M25561 Pain in right knee: Secondary | ICD-10-CM | POA: Diagnosis not present

## 2019-07-22 DIAGNOSIS — K449 Diaphragmatic hernia without obstruction or gangrene: Secondary | ICD-10-CM

## 2019-07-22 MED ORDER — BACLOFEN 10 MG PO TABS: 5.0000 mg | ORAL_TABLET | Freq: Three times a day (TID) | ORAL | 0 refills | Status: DC | PRN

## 2019-07-22 NOTE — Progress Notes (Signed)
Telemedicine Encounter: Disclosed to patient at start of encounter that we will provide appropriate telemedicine services.  Patient consents to be treated via phone prior to discussion. - Patient is at her home and is accessed via telephone. - Services are provided by [name] from [facility].  Subjective:    Patient ID: Crystal Mendez, female    DOB: Mar 04, 1986, 33 y.o.   MRN: [id number]  Crystal Mendez is a 33 y.o. female presenting on 07/22/2019 for Knee Injury (pt fell down in [employer] and injuried her Right knee, ankle and foot x 3 days ago)  HPI Right LEG pain  RIGHT knee and foot are still hurting.  When she bends her knee "it feels like it is stretching all the ligaments in my leg."  Stretching above and below the knee.   - Patient has pain at rest.  Pain level reported at 7/10 at rest.  When moving pain increases to 9/10.  Medicine is helping, but still feels stretching.  Patient has ? Amount of tramadol remaining.  Patient still has a few.   - ibuprofen 600mg  patient is taking every 6 hours.  Patient does not have an appointment with orthopedics.  Weight landed on her knee.  Felt pain all down her leg.  Patient is planning to take legal action against [employer]  Social History   Tobacco Use  . Smoking status: Never Smoker  . Smokeless tobacco: Never Used  Substance Use Topics  . Alcohol use: No  . Drug use: No    Review of Systems Per HPI unless specifically indicated above     Objective:    There were no vitals taken for this visit.  Wt Readings from Last 3 Encounters:  01/05/19 272 lb (123.4 kg)  12/22/18 270 lb (122.5 kg)  12/18/18 268 lb (121.6 kg)    Physical Exam Patient remotely monitored.  Verbal communication appropriate.  Cognition normal.   Results for orders placed or performed in visit on 01/05/19  CBC with Differential/Platelet  Result Value Ref Range   WBC 7.4 3.8 - 10.8 Thousand/uL   RBC 4.38 3.80 - 5.10  Million/uL   Hemoglobin 12.1 11.7 - 15.5 g/dL   HCT 01/07/19 93.8 - 18.2 %   MCV 85.6 80.0 - 100.0 fL   MCH 27.6 27.0 - 33.0 pg   MCHC 32.3 32.0 - 36.0 g/dL   RDW 99.3 71.6 - 96.7 %   Platelets 398 140 - 400 Thousand/uL   MPV 10.9 7.5 - 12.5 fL   Neutro Abs 3,596 1,500 - 7,800 cells/uL   Lymphs Abs 3,360 850 - 3,900 cells/uL   Absolute Monocytes 266 200 - 950 cells/uL   Eosinophils Absolute 118 15 - 500 cells/uL   Basophils Absolute 59 0 - 200 cells/uL   Neutrophils Relative % 48.6 %   Total Lymphocyte 45.4 %   Monocytes Relative 3.6 %   Eosinophils Relative 1.6 %   Basophils Relative 0.8 %  Iron, TIBC and Ferritin Panel  Result Value Ref Range   Iron 39 (L) 40 - 190 mcg/dL   TIBC 89.3 810 - 175 mcg/dL (calc)   %SAT 14 (L) 16 - 45 % (calc)   Ferritin 32 16 - 154 ng/mL      Assessment & Plan:   Problem List Items Addressed This Visit    None    Visit Diagnoses    Acute pain of right knee    -  Primary   Right leg  pain        Traumatic injury of right knee/leg.  Now with soft tissue pain 4 days later without significant improvement on anti-inflammatories and immobilization.   Plan: 1. Patient needs orthopedic referral - Dr. Roland Rack referral not visible in [place], but was recommended from ED.  - Instructed patient to seek evaluation at [facility] urgent care hours if needed.  Referral placed to [facility]. 2. Continue ibuprofen. 3. Continue remaining tramadol prn. No additional fills to be provided.  Do not take at same time as muscle relaxer. 4. START baclofen 10 mg tab. Take 5-10 mg tid prn muscle hypertonicity/spasm.  Start only at bedtime.  Cautioned drowsiness. 5. Follow-up prn.   No orders of the defined types were placed in this encounter.   - Time spent in direct consultation with patient via telemedicine about above concerns: 7 minutes  Follow up plan: 2-4 weeks prn no improvement or worsening  Cassell Smiles, DNP, AGPCNP-BC Adult Gerontology Primary Care  Nurse Practitioner [facility] 07/22/2019, 8:46 AM

## 2019-07-26 DIAGNOSIS — S86911A Strain of unspecified muscle(s) and tendon(s) at lower leg level, right leg, initial encounter: Secondary | ICD-10-CM | POA: Diagnosis not present

## 2019-07-28 ENCOUNTER — Other Ambulatory Visit: Payer: Self-pay

## 2019-07-28 ENCOUNTER — Ambulatory Visit
Admission: RE | Admit: 2019-07-28 | Discharge: 2019-07-28 | Disposition: A | Discharge status: Home / Self Care | Payer: Medicaid Other | Source: Ambulatory Visit | Attending: Surgical Oncology | Admitting: Surgical Oncology

## 2019-07-28 ENCOUNTER — Other Ambulatory Visit: Payer: Medicaid Other

## 2019-07-28 DIAGNOSIS — K219 Gastro-esophageal reflux disease without esophagitis: Secondary | ICD-10-CM | POA: Diagnosis not present

## 2019-07-28 DIAGNOSIS — K449 Diaphragmatic hernia without obstruction or gangrene: Secondary | ICD-10-CM | POA: Diagnosis not present

## 2019-08-03 DIAGNOSIS — S8991XD Unspecified injury of right lower leg, subsequent encounter: Secondary | ICD-10-CM | POA: Diagnosis not present

## 2019-08-16 DIAGNOSIS — S86911A Strain of unspecified muscle(s) and tendon(s) at lower leg level, right leg, initial encounter: Secondary | ICD-10-CM | POA: Diagnosis not present

## 2019-08-19 DIAGNOSIS — S86811D Strain of other muscle(s) and tendon(s) at lower leg level, right leg, subsequent encounter: Secondary | ICD-10-CM | POA: Diagnosis not present

## 2019-08-25 DIAGNOSIS — D509 Iron deficiency anemia, unspecified: Secondary | ICD-10-CM | POA: Diagnosis not present

## 2019-08-25 DIAGNOSIS — K219 Gastro-esophageal reflux disease without esophagitis: Secondary | ICD-10-CM | POA: Diagnosis not present

## 2019-08-25 DIAGNOSIS — Z01818 Encounter for other preprocedural examination: Secondary | ICD-10-CM | POA: Diagnosis not present

## 2019-08-25 DIAGNOSIS — Z6841 Body Mass Index (BMI) 40.0 and over, adult: Secondary | ICD-10-CM | POA: Diagnosis not present

## 2019-08-25 DIAGNOSIS — R7303 Prediabetes: Secondary | ICD-10-CM | POA: Diagnosis not present

## 2019-08-30 DIAGNOSIS — Z01818 Encounter for other preprocedural examination: Secondary | ICD-10-CM | POA: Diagnosis not present

## 2019-08-30 DIAGNOSIS — K219 Gastro-esophageal reflux disease without esophagitis: Secondary | ICD-10-CM | POA: Diagnosis not present

## 2019-10-20 ENCOUNTER — Other Ambulatory Visit: Payer: Self-pay

## 2019-10-20 ENCOUNTER — Ambulatory Visit (LOCAL_COMMUNITY_HEALTH_CENTER): Payer: Medicaid Other

## 2019-10-20 DIAGNOSIS — Z23 Encounter for immunization: Secondary | ICD-10-CM

## 2019-10-20 NOTE — Progress Notes (Signed)
Client states able to eat a boiled egg without problems. Influenza vaccine administered without difficulty and mother remained in clinic for observation x20 minutes. Ambulatory without problems when exited exam room. Rich Number, RN

## 2019-10-27 DIAGNOSIS — Z20822 Contact with and (suspected) exposure to covid-19: Secondary | ICD-10-CM | POA: Diagnosis not present

## 2019-10-27 DIAGNOSIS — Z01818 Encounter for other preprocedural examination: Secondary | ICD-10-CM | POA: Diagnosis not present

## 2019-11-02 DIAGNOSIS — Z6841 Body Mass Index (BMI) 40.0 and over, adult: Secondary | ICD-10-CM | POA: Diagnosis not present

## 2019-11-02 DIAGNOSIS — K219 Gastro-esophageal reflux disease without esophagitis: Secondary | ICD-10-CM | POA: Diagnosis not present

## 2019-11-02 DIAGNOSIS — I10 Essential (primary) hypertension: Secondary | ICD-10-CM | POA: Diagnosis not present

## 2019-11-07 ENCOUNTER — Emergency Department: Payer: Medicaid Other

## 2019-11-07 ENCOUNTER — Other Ambulatory Visit: Payer: Self-pay

## 2019-11-07 ENCOUNTER — Emergency Department
Admission: EM | Admit: 2019-11-07 | Discharge: 2019-11-08 | Disposition: A | Discharge status: Home / Self Care | Payer: Medicaid Other | Attending: Emergency Medicine | Admitting: Emergency Medicine

## 2019-11-07 DIAGNOSIS — Z9884 Bariatric surgery status: Secondary | ICD-10-CM | POA: Diagnosis not present

## 2019-11-07 DIAGNOSIS — R1084 Generalized abdominal pain: Secondary | ICD-10-CM | POA: Diagnosis not present

## 2019-11-07 DIAGNOSIS — R531 Weakness: Secondary | ICD-10-CM | POA: Diagnosis not present

## 2019-11-07 DIAGNOSIS — K9189 Other postprocedural complications and disorders of digestive system: Secondary | ICD-10-CM | POA: Insufficient documentation

## 2019-11-07 DIAGNOSIS — R1110 Vomiting, unspecified: Secondary | ICD-10-CM | POA: Diagnosis not present

## 2019-11-07 DIAGNOSIS — R509 Fever, unspecified: Secondary | ICD-10-CM | POA: Insufficient documentation

## 2019-11-07 DIAGNOSIS — E119 Type 2 diabetes mellitus without complications: Secondary | ICD-10-CM | POA: Diagnosis not present

## 2019-11-07 DIAGNOSIS — Z20822 Contact with and (suspected) exposure to covid-19: Secondary | ICD-10-CM | POA: Diagnosis not present

## 2019-11-07 DIAGNOSIS — R112 Nausea with vomiting, unspecified: Secondary | ICD-10-CM | POA: Diagnosis not present

## 2019-11-07 DIAGNOSIS — Z79899 Other long term (current) drug therapy: Secondary | ICD-10-CM | POA: Diagnosis not present

## 2019-11-07 LAB — COMPREHENSIVE METABOLIC PANEL
ALT: 23 U/L (ref 0–44)
AST: 12 U/L — ABNORMAL LOW (ref 15–41)
Albumin: 3.6 g/dL (ref 3.5–5.0)
Alkaline Phosphatase: 65 U/L (ref 38–126)
Anion gap: 12 (ref 5–15)
BUN: 6 mg/dL (ref 6–20)
CO2: 19 mmol/L — ABNORMAL LOW (ref 22–32)
Calcium: 8.8 mg/dL — ABNORMAL LOW (ref 8.9–10.3)
Chloride: 106 mmol/L (ref 98–111)
Creatinine, Ser: 0.7 mg/dL (ref 0.44–1.00)
GFR calc Af Amer: >60 mL/min (ref >60)
GFR calc non Af Amer: >60 mL/min (ref >60)
Glucose, Bld: 80 mg/dL (ref 70–99)
Potassium: 3.4 mmol/L — ABNORMAL LOW (ref 3.5–5.1)
Sodium: 137 mmol/L (ref 135–145)
Total Bilirubin: 0.9 mg/dL (ref 0.3–1.2)
Total Protein: 8 g/dL (ref 6.5–8.1)

## 2019-11-07 LAB — CBC
HCT: 38.7 % (ref 36.0–46.0)
Hemoglobin: 12.1 g/dL (ref 12.0–15.0)
MCH: 27.3 pg (ref 26.0–34.0)
MCHC: 31.3 g/dL (ref 30.0–36.0)
MCV: 87.2 fL (ref 80.0–100.0)
Platelets: 465 10*3/uL — ABNORMAL HIGH (ref 150–400)
RBC: 4.44 MIL/uL (ref 3.87–5.11)
RDW: 13 % (ref 11.5–15.5)
WBC: 12.9 10*3/uL — ABNORMAL HIGH (ref 4.0–10.5)
nRBC: 0 % (ref 0.0–0.2)

## 2019-11-07 LAB — LACTIC ACID, PLASMA: Lactic Acid, Venous: 0.8 mmol/L (ref 0.5–1.9)

## 2019-11-07 LAB — CBC WITH DIFFERENTIAL/PLATELET
Abs Immature Granulocytes: 0.07 10*3/uL (ref 0.00–0.07)
Basophils Absolute: 0.1 10*3/uL (ref 0.0–0.1)
Basophils Relative: 1 %
Eosinophils Absolute: 0.1 10*3/uL (ref 0.0–0.5)
Eosinophils Relative: 1 %
HCT: 38.7 % (ref 36.0–46.0)
Hemoglobin: 12.1 g/dL (ref 12.0–15.0)
Immature Granulocytes: 1 %
Lymphocytes Relative: 21 %
Lymphs Abs: 2.7 10*3/uL (ref 0.7–4.0)
MCH: 27.3 pg (ref 26.0–34.0)
MCHC: 31.3 g/dL (ref 30.0–36.0)
MCV: 87.4 fL (ref 80.0–100.0)
Monocytes Absolute: 1 10*3/uL (ref 0.1–1.0)
Monocytes Relative: 8 %
Neutro Abs: 8.9 10*3/uL — ABNORMAL HIGH (ref 1.7–7.7)
Neutrophils Relative %: 68 %
Platelets: 476 10*3/uL — ABNORMAL HIGH (ref 150–400)
RBC: 4.43 MIL/uL (ref 3.87–5.11)
RDW: 12.9 % (ref 11.5–15.5)
WBC: 12.8 10*3/uL — ABNORMAL HIGH (ref 4.0–10.5)
nRBC: 0 % (ref 0.0–0.2)

## 2019-11-07 LAB — PROTIME-INR
INR: 1.1 (ref 0.8–1.2)
Prothrombin Time: 14.3 seconds (ref 11.4–15.2)

## 2019-11-07 LAB — APTT: aPTT: 43 seconds — ABNORMAL HIGH (ref 24–36)

## 2019-11-07 MED ORDER — ONDANSETRON HCL 4 MG/2ML IJ SOLN
4.0000 mg | Freq: Once | INTRAMUSCULAR | Status: AC
  Administered 2019-11-07: 4 mg via INTRAVENOUS
  Filled 2019-11-07: qty 2

## 2019-11-07 MED ORDER — THIAMINE HCL 100 MG/ML IJ SOLN
100.0000 mg | Freq: Once | INTRAMUSCULAR | Status: AC
  Administered 2019-11-07: 23:00:00 100 mg via INTRAVENOUS
  Filled 2019-11-07: qty 2

## 2019-11-07 MED ORDER — LACTATED RINGERS IV BOLUS
1000.0000 mL | Freq: Once | INTRAVENOUS | Status: AC
  Administered 2019-11-07: 23:00:00 1000 mL via INTRAVENOUS

## 2019-11-07 MED ORDER — SODIUM CHLORIDE 0.9 % IV SOLN
1.0000 mg | Freq: Once | INTRAVENOUS | Status: AC
  Administered 2019-11-08: 02:00:00 1 mg via INTRAVENOUS
  Filled 2019-11-07: qty 0.2

## 2019-11-07 MED ORDER — PIPERACILLIN-TAZOBACTAM 3.375 G IVPB 30 MIN
3.3750 g | Freq: Once | INTRAVENOUS | Status: AC
  Administered 2019-11-07: 23:00:00 3.375 g via INTRAVENOUS
  Filled 2019-11-07: qty 50

## 2019-11-07 MED ORDER — ACETAMINOPHEN 160 MG/5ML PO SOLN
650.0000 mg | Freq: Once | ORAL | Status: AC
  Administered 2019-11-08: 01:00:00 650 mg via ORAL
  Filled 2019-11-07: qty 20.3

## 2019-11-07 NOTE — ED Triage Notes (Signed)
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 No distress noted at this time.

## 2019-11-07 NOTE — ED Triage Notes (Signed)
Patient had bariatric surgery on 01/12. Today started with diarrhea and nausea, states her tongue "is white." Patient is alert and oriented, able to answer questions without difficulty.

## 2019-11-07 NOTE — ED Provider Notes (Signed)
[facility] Emergency Department Provider Note  ____________________________________________   First MD Initiated Contact with Patient 11/07/19 2210     (approximate)  I have reviewed the triage vital signs and the nursing notes.   HISTORY  Chief Complaint Vomiting (post surgical)    HPI Crystal Mendez is a 34 y.o. female  S/p laparoscopic roux-en-Y bypass at [facility] with Dr. Toney Rakes on 1/12, here with fever, nausea, vomiting. Pt reports that she had been doing very well since her surgery, which only required 2 days of hospitalization. Over the past 2 days, however, she has begun to notice nausea, difficult tolerating liquids, and diffuse fatigue and body aches. She has noticed thick, dry MM as well with difficulty tolerating PO. No urinary sx. No cough. Her abd pain has been slowly improving. She is having loose stools but no blood. No vomiting. No known COVID exposures. No leg swelling or calf TTP. No other complaints.        Past Medical History:  Diagnosis Date  . Abnormal Pap smear of cervix 05/01/2016  . Anemia   . Anxiety   . Complication of anesthesia    ITCHING  . Diabetes mellitus without complication (HCC)    WAS ON METFORMIN BUT A1C HAS DECREASED SO MD TOOK PT OFF-LAST A1C ON 03-2017 WAS 5.7  . GERD (gastroesophageal reflux disease)    NO MEDS  . Hyperthyroidism     Patient Active Problem List   Diagnosis Date Noted  . Type 2 diabetes mellitus without complication, without long-term current use of insulin ([place]) 12/18/2018  . Other acne 10/28/2018  . Hirsutism 10/28/2018  . Iron deficiency anemia 09/05/2016  . Elevated BP without diagnosis of hypertension 08/05/2016  . Gastroesophageal reflux disease without esophagitis 08/05/2016  . Obesity, Class III, BMI 40-49.9 (morbid obesity) ([place]) 08/05/2016  . Prediabetes 08/05/2016  . History of cervical dysplasia 06/19/2016    Past Surgical History:  Procedure Laterality Date  .  BARIATRIC SURGERY    . CESAREAN SECTION     X 4  . CHOLECYSTECTOMY    . COLPOSCOPY  06/18/2016  . CYSTOSCOPY N/A 04/15/2017   Procedure: CYSTOSCOPY;  Surgeon: Gae Dry, MD;  Location: [facility] ORS;  Service: Gynecology;  Laterality: N/A;  . LAPAROSCOPIC HYSTERECTOMY N/A 04/15/2017   Ovaries remain - Procedure: HYSTERECTOMY TOTAL LAPAROSCOPIC;  Surgeon: Gae Dry, MD;  Location: [facility] ORS;  Service: Gynecology;  Laterality: N/A;  . TONSILLECTOMY    . TUBAL LIGATION    . TUBAL LIGATION    . UPPER GASTROINTESTINAL ENDOSCOPY      Prior to Admission medications   Medication Sig Start Date End Date Taking? Authorizing Provider  HYDROcodone-acetaminophen (NORCO/VICODIN) 5-325 MG tablet Take 1-2 tablets by mouth every 6 (six) hours as needed. 11/04/19 11/09/19 Yes [provider]  omeprazole (PRILOSEC) 20 MG capsule Take 20 mg by mouth daily. 08/25/19  Yes [provider]  promethazine (PHENERGAN) 25 MG tablet Take 25 mg by mouth every 6 (six) hours as needed. 08/25/19  Yes [provider]  scopolamine (TRANSDERM-SCOP) 1 MG/3DAYS Place 1 patch onto the skin every 3 (three) days. 11/04/19 11/08/19 Yes [provider]  senna (SENOKOT) 8.6 MG TABS tablet Take 1 tablet by mouth 2 (two) times daily. 08/25/19  Yes [provider]  baclofen (LIORESAL) 10 MG tablet Take 0.5-1 tablets (5-10 mg total) by mouth 3 (three) times daily as needed for muscle spasms. Patient not taking: Reported on 11/07/2019 07/22/19   Merrilyn Puma,  Alison Stalling, NP  ciclopirox (PENLAC) 8 % solution APPLY TOPICALLY NIGHTLY APPLY OVER NAIL AND SURROUNDING SKIN. 07/17/18   [provider]  cyanocobalamin (,VITAMIN B-12,) 1000 MCG/ML injection INJECT 1 ML (1,000 MCG TOTAL) INTO THE MUSCLE ONCE FOR 1 DOSE. REPEAT MONTHLY. 12/18/18   [provider]  fluconazole (DIFLUCAN) 150 MG tablet If yeast infection - Take one tablet by mouth on Day 1. Repeat dose 2nd tablet on Day  3. Patient not taking: Reported on 07/22/2019 02/19/19   Smitty Cords, DO  gabapentin (NEURONTIN) 100 MG capsule Take 1 capsule (100 mg total) by mouth at bedtime. Patient not taking: Reported on 07/22/2019 11/10/18   Smitty Cords, DO  Probiotic Product (PROBIOTIC PO) Take 1 tablet by mouth daily.    [provider]  sucralfate (CARAFATE) 1 g tablet Take by mouth. 01/05/19 07/04/19  [provider]  traMADol (ULTRAM) 50 MG tablet Take 1 tablet (50 mg total) by mouth every 6 (six) hours as needed. Patient not taking: Reported on 11/07/2019 07/19/19 07/18/20  Enid Derry, PA-C  ursodiol (ACTIGALL) 250 MG tablet Take 250 mg by mouth 2 (two) times daily. 08/25/19   [provider]    Allergies Eggs or egg-derived products and Lactose  Family History  Problem Relation Age of Onset  . Ataxia Mother        spinal cerebellar ataxia  . Stroke Father        ischemic  . Ataxia Sister   . Ataxia Brother   . Autism Son   . Autism Son   . Healthy Son   . Developmental delay Son     Social History Social History   Tobacco Use  . Smoking status: Never Smoker  . Smokeless tobacco: Never Used  Substance Use Topics  . Alcohol use: No  . Drug use: No    Review of Systems  Review of Systems  Constitutional: Positive for chills, fatigue and fever.  HENT: Negative for congestion and sore throat.   Eyes: Negative for visual disturbance.  Respiratory: Negative for cough and shortness of breath.   Cardiovascular: Negative for chest pain.  Gastrointestinal: Positive for abdominal pain, nausea and vomiting. Negative for diarrhea.  Genitourinary: Negative for flank pain.  Musculoskeletal: Negative for back pain and neck pain.  Skin: Negative for rash and wound.  Neurological: Positive for weakness.     ____________________________________________  PHYSICAL EXAM:      VITAL SIGNS: ED Triage Vitals  Enc Vitals Group     BP 11/07/19 2141 (!)  128/97     Pulse Rate 11/07/19 2141 (!) 111     Resp 11/07/19 2141 20     Temp 11/07/19 2141 (!) 100.7 F (38.2 C)     Temp Source 11/07/19 2141 Oral     SpO2 11/07/19 2141 100 %     Weight 11/07/19 2142 250 lb (113.4 kg)     Height 11/07/19 2142 5\' 6"  (1.676 m)     Head Circumference --      Peak Flow --      Pain Score 11/07/19 2142 7     Pain Loc --      Pain Edu? --      Excl. in GC? --      Physical Exam Vitals and nursing note reviewed.  Constitutional:      General: She is not in acute distress.    Appearance: She is well-developed.  HENT:     Head: Normocephalic and atraumatic.  Mouth/Throat:     Mouth: Mucous membranes are dry.     Comments: Dry, tachy MM. Eyes:     Conjunctiva/sclera: Conjunctivae normal.  Cardiovascular:     Rate and Rhythm: Normal rate and regular rhythm.     Heart sounds: Normal heart sounds. No murmur. No friction rub.  Pulmonary:     Effort: Pulmonary effort is normal. No respiratory distress.     Breath sounds: Normal breath sounds. No wheezing or rales.  Abdominal:     General: Abdomen is flat. There is no distension.     Palpations: Abdomen is soft.     Tenderness: There is abdominal tenderness.     Comments: Minimal diffuse, expected abd TTP without rebound or guarding. Surgical incisions c/d/i with dermabond intact, no surrounding erythema.  Musculoskeletal:     Cervical back: Neck supple.  Skin:    General: Skin is warm.     Capillary Refill: Capillary refill takes less than 2 seconds.  Neurological:     Mental Status: She is alert and oriented to person, place, and time.     Motor: No abnormal muscle tone.       ____________________________________________   LABS (all labs ordered are listed, but only abnormal results are displayed)  Labs Reviewed  CBC - Abnormal; Notable for the following components:      Result Value   WBC 12.9 (*)    Platelets 465 (*)    All other components within normal limits   COMPREHENSIVE METABOLIC PANEL - Abnormal; Notable for the following components:   Potassium 3.4 (*)    CO2 19 (*)    Calcium 8.8 (*)    AST 12 (*)    All other components within normal limits  APTT - Abnormal; Notable for the following components:   aPTT 43 (*)    All other components within normal limits  CBC WITH DIFFERENTIAL/PLATELET - Abnormal; Notable for the following components:   WBC 12.8 (*)    Platelets 476 (*)    Neutro Abs 8.9 (*)    All other components within normal limits  CULTURE, BLOOD (ROUTINE X 2)  CULTURE, BLOOD (ROUTINE X 2)  URINE CULTURE  RESPIRATORY PANEL BY RT PCR (FLU A&B, COVID)  LACTIC ACID, PLASMA  PROTIME-INR  LACTIC ACID, PLASMA  URINALYSIS, ROUTINE W REFLEX MICROSCOPIC  POC URINE PREG, ED    ____________________________________________  EKG: Normal sinus rhythm, VR 82. QRS 85, QTc 471. No ST elevations or depressions. No ischemia or infarct. ________________________________________  RADIOLOGY All imaging, including plain films, CT scans, and ultrasounds, independently reviewed by me, and interpretations confirmed via formal radiology reads.  ED MD interpretation:   CXR: No active disease  Official radiology report(s): DG Chest Port 1 View  Result Date: 11/07/2019 CLINICAL DATA:  Fever. EXAM: PORTABLE CHEST 1 VIEW COMPARISON:  None. FINDINGS: The heart size is normal. There is likely bibasilar atelectasis. No large focal infiltrate. No significant pleural effusion. No acute osseous abnormality. IMPRESSION: No active disease. Electronically Signed   By: Katherine Mantle M.D.   On: 11/07/2019 22:28    ____________________________________________  PROCEDURES   Procedure(s) performed (including Critical Care):  .Critical Care Performed by: Shaune Pollack, MD Authorized by: Shaune Pollack, MD   Critical care provider statement:    Critical care time (minutes):  35   Critical care time was exclusive of:  Separately billable  procedures and treating other patients and teaching time   Critical care was necessary to treat or prevent imminent or  life-threatening deterioration of the following conditions:  Circulatory failure, cardiac failure and sepsis   Critical care was time spent personally by me on the following activities:  Development of treatment plan with patient or surrogate, discussions with consultants, evaluation of patient's response to treatment, examination of patient, obtaining history from patient or surrogate, ordering and performing treatments and interventions, ordering and review of laboratory studies, ordering and review of radiographic studies, pulse oximetry, re-evaluation of patient's condition and review of old charts   I assumed direction of critical care for this patient from another provider in my specialty: no      ____________________________________________  INITIAL IMPRESSION / MDM / ASSESSMENT AND PLAN / ED COURSE  As part of my medical decision making, I reviewed the following data within the electronic MEDICAL RECORD NUMBER Nursing notes reviewed and incorporated, Old chart reviewed, Notes from prior ED visits, and [place] Controlled Substance Database       *JOELI Mendez was evaluated in Emergency Department on 11/07/2019 for the symptoms described in the history of present illness. She was evaluated in the context of the global COVID-19 pandemic, which necessitated consideration that the patient might be at risk for infection with the SARS-CoV-2 virus that causes COVID-19. Institutional protocols and algorithms that pertain to the evaluation of patients at risk for COVID-19 are in a state of rapid change based on information released by regulatory bodies including the CDC and federal and state organizations. These policies and algorithms were followed during the patient's care in the ED.  Some ED evaluations and interventions may be delayed as a result of limited staffing during the  pandemic.*     Medical Decision Making:  34 yo M with PMHx as above, s/p lap Roux-en-Y with Dr. Lily Peer 1/12 at [facility], here with fever, nausea, vomiting. Pt non-toxic, well appearing on exam with mild dehydration. Abdomen soft, NT, ND. Labs show moderate leukocytosis, hypokalemia. Will start sepsis protocol w/ empiric Zosyn for intra-abd coverage. DDx includes UTI, atelectasis/PNA, COVID-19, anastamotic leak/post-op infection. No signs of wound infection. No LE edema, time course is not c/w DVT/PE. Will check CT, plan to discuss with [facility].   ____________________________________________  FINAL CLINICAL IMPRESSION(S) / ED DIAGNOSES  Final diagnoses:  Fever in adult  History of Roux-en-Y gastric bypass     MEDICATIONS GIVEN DURING THIS VISIT:  Medications  folic acid 1 mg in sodium chloride 0.9 % 50 mL IVPB (has no administration in time range)  acetaminophen (TYLENOL) 160 MG/5ML solution 650 mg (has no administration in time range)  piperacillin-tazobactam (ZOSYN) IVPB 3.375 g (3.375 g Intravenous New Bag/Given 11/07/19 2242)  lactated ringers bolus 1,000 mL (1,000 mLs Intravenous New Bag/Given 11/07/19 2248)  lactated ringers bolus 1,000 mL (1,000 mLs Intravenous New Bag/Given 11/07/19 2247)  ondansetron (ZOFRAN) injection 4 mg (4 mg Intravenous Given 11/07/19 2246)  thiamine (B-1) injection 100 mg (100 mg Intravenous Given 11/07/19 2248)     ED Discharge Orders    None       Note:  This document was prepared using Dragon voice recognition software and may include unintentional dictation errors.   Shaune Pollack, MD 11/07/19 2329

## 2019-11-08 ENCOUNTER — Emergency Department: Payer: Medicaid Other

## 2019-11-08 ENCOUNTER — Encounter: Payer: Self-pay | Admitting: Radiology

## 2019-11-08 DIAGNOSIS — Z9884 Bariatric surgery status: Secondary | ICD-10-CM | POA: Diagnosis not present

## 2019-11-08 DIAGNOSIS — Z049 Encounter for examination and observation for unspecified reason: Secondary | ICD-10-CM | POA: Diagnosis not present

## 2019-11-08 DIAGNOSIS — R112 Nausea with vomiting, unspecified: Secondary | ICD-10-CM | POA: Diagnosis not present

## 2019-11-08 DIAGNOSIS — R109 Unspecified abdominal pain: Secondary | ICD-10-CM | POA: Diagnosis not present

## 2019-11-08 LAB — RESPIRATORY PANEL BY RT PCR (FLU A&B, COVID)
Influenza A by PCR: NEGATIVE
Influenza B by PCR: NEGATIVE
SARS Coronavirus 2 by RT PCR: NEGATIVE

## 2019-11-08 LAB — URINALYSIS, ROUTINE W REFLEX MICROSCOPIC
Bacteria, UA: NONE SEEN
Bilirubin Urine: NEGATIVE
Glucose, UA: NEGATIVE mg/dL
Hgb urine dipstick: NEGATIVE
Ketones, ur: 80 mg/dL — AB
Leukocytes,Ua: NEGATIVE
Nitrite: NEGATIVE
Protein, ur: 100 mg/dL — AB
Specific Gravity, Urine: 1.026 (ref 1.005–1.030)
pH: 5 (ref 5.0–8.0)

## 2019-11-08 LAB — URINE CULTURE: Culture: NO GROWTH

## 2019-11-08 LAB — POCT PREGNANCY, URINE: Preg Test, Ur: NEGATIVE

## 2019-11-08 LAB — LACTIC ACID, PLASMA: Lactic Acid, Venous: 0.8 mmol/L (ref 0.5–1.9)

## 2019-11-08 MED ORDER — IOHEXOL 300 MG/ML  SOLN
100.0000 mL | Freq: Once | INTRAMUSCULAR | Status: AC | PRN
  Administered 2019-11-08: 01:00:00 100 mL via INTRAVENOUS

## 2019-11-08 MED ORDER — SODIUM CHLORIDE 0.9 % IV SOLN: Freq: Once | INTRAVENOUS | Status: AC

## 2019-11-08 MED ORDER — IOHEXOL 9 MG/ML PO SOLN: 500.0000 mL | Freq: Once | ORAL | Status: DC

## 2019-11-08 NOTE — ED Notes (Signed)
Pt helped to toilet, pt with c/o pain 7/10 but states that she does not want any pain meds other than the tylenol at this point. Pt steady on feet, no c/o dizziness, no c/o nausea. Pt helped back to bed.

## 2019-11-08 NOTE — ED Provider Notes (Signed)
-----------------------------------------   1:52 AM on 11/08/2019 -----------------------------------------  Patient care assumed from Dr. Erma Heritage.  Overall CT scan negative for acute abnormality.  However given the patient's fever mild leukocytosis nausea vomiting with signs of dehydration I spoke to Dr. Lily Peer at [facility], he would like the patient transferred to [facility] for continued monitoring and treatment.  Discussed this with the patient who is agreeable to plan of care.  We will transfer to [facility] once a bed is assigned.   Minna Antis, MD 11/08/19 [phone]

## 2019-11-08 NOTE — ED Notes (Signed)
[employer] ETA [id number]

## 2019-11-12 LAB — CULTURE, BLOOD (ROUTINE X 2)
Culture: NO GROWTH
Culture: NO GROWTH
Special Requests: ADEQUATE
Special Requests: ADEQUATE

## 2019-11-15 ENCOUNTER — Ambulatory Visit (INDEPENDENT_AMBULATORY_CARE_PROVIDER_SITE_OTHER): Payer: Medicaid Other | Admitting: Family Medicine

## 2019-11-15 ENCOUNTER — Encounter: Payer: Self-pay | Admitting: Family Medicine

## 2019-11-15 ENCOUNTER — Other Ambulatory Visit: Payer: Self-pay

## 2019-11-15 DIAGNOSIS — B373 Candidiasis of vulva and vagina: Secondary | ICD-10-CM

## 2019-11-15 DIAGNOSIS — B379 Candidiasis, unspecified: Secondary | ICD-10-CM | POA: Diagnosis not present

## 2019-11-15 DIAGNOSIS — T3695XA Adverse effect of unspecified systemic antibiotic, initial encounter: Secondary | ICD-10-CM

## 2019-11-15 DIAGNOSIS — B3731 Acute candidiasis of vulva and vagina: Secondary | ICD-10-CM

## 2019-11-15 MED ORDER — FLUCONAZOLE 150 MG PO TABS: ORAL_TABLET | ORAL | 2 refills | Status: DC

## 2019-11-15 NOTE — Progress Notes (Signed)
Virtual Visit via Telephone The purpose of this virtual visit is to provide medical care while limiting exposure to the novel coronavirus (COVID19) for both patient and office staff.  Consent was obtained for phone visit:  Yes.   Answered questions that patient had about telehealth interaction:  Yes.   I discussed the limitations, risks, security and privacy concerns of performing an evaluation and management service by telephone. I also discussed with the patient that there may be a patient responsible charge related to this service. The patient expressed understanding and agreed to proceed.  Patient Location: Home Provider Location: [facility])  ---------------------------------------------------------------------- Chief Complaint  Patient presents with  . Vaginitis    onset 3 days     S: Reviewed CMA documentation. I have called patient and gathered additional HPI as follows:  Vaginitis, presumed yeast infection Reports that symptoms started 3 days ago with persistent irritation with burning, itching, chaffing similar to prior yeast infection. She has had prior yeast infections fairly often, has history of bariatric surgery gastric bypass, she has had increased amount of antibiotics, usually common trigger. Usually diflucan has worked for her has to take 2nd pill in past with good results.  Denies any dysuria or hematuria urinary odor or frequency  Denies any high risk travel to areas of current concern for COVID19. Denies any known or suspected exposure to person with or possibly with COVID19.  Denies any fevers, chills, sweats, body ache, cough, shortness of breath, sinus pain or pressure, headache, abdominal pain, diarrhea  Past Medical History:  Diagnosis Date  . Abnormal Pap smear of cervix 05/01/2016  . Anemia   . Anxiety   . Complication of anesthesia    ITCHING  . Diabetes mellitus without complication (HCC)    WAS ON METFORMIN BUT A1C HAS  DECREASED SO MD TOOK PT OFF-LAST A1C ON 03-2017 WAS 5.7  . GERD (gastroesophageal reflux disease)    NO MEDS  . Hyperthyroidism    Social History   Tobacco Use  . Smoking status: Never Smoker  . Smokeless tobacco: Never Used  Substance Use Topics  . Alcohol use: No  . Drug use: No    Current Outpatient Medications:  .  omeprazole (PRILOSEC) 20 MG capsule, Take 20 mg by mouth daily., Disp: , Rfl:  .  Probiotic Product (PROBIOTIC PO), Take 1 tablet by mouth daily., Disp: , Rfl:  .  promethazine (PHENERGAN) 25 MG tablet, Take 25 mg by mouth every 6 (six) hours as needed., Disp: , Rfl:  .  senna (SENOKOT) 8.6 MG TABS tablet, Take 1 tablet by mouth 2 (two) times daily., Disp: , Rfl:  .  traMADol (ULTRAM) 50 MG tablet, Take 1 tablet (50 mg total) by mouth every 6 (six) hours as needed., Disp: 8 tablet, Rfl: 0 .  baclofen (LIORESAL) 10 MG tablet, Take 0.5-1 tablets (5-10 mg total) by mouth 3 (three) times daily as needed for muscle spasms. (Patient not taking: Reported on 11/15/2019), Disp: 30 each, Rfl: 0 .  ciclopirox (PENLAC) 8 % solution, APPLY TOPICALLY NIGHTLY APPLY OVER NAIL AND SURROUNDING SKIN., Disp: , Rfl:  .  cyanocobalamin (,VITAMIN B-12,) 1000 MCG/ML injection, INJECT 1 ML (1,000 MCG TOTAL) INTO THE MUSCLE ONCE FOR 1 DOSE. REPEAT MONTHLY., Disp: , Rfl:  .  fluconazole (DIFLUCAN) 150 MG tablet, Take one tablet by mouth on Day 1. Repeat dose 2nd tablet on Day 3., Disp: 2 tablet, Rfl: 2 .  gabapentin (NEURONTIN) 100 MG capsule, Take 1 capsule (  100 mg total) by mouth at bedtime. (Patient not taking: Reported on 07/22/2019), Disp: 90 capsule, Rfl: 1 .  sucralfate (CARAFATE) 1 g tablet, Take by mouth., Disp: , Rfl:  .  ursodiol (ACTIGALL) 250 MG tablet, Take 250 mg by mouth 2 (two) times daily., Disp: , Rfl:   Depression screen [facility] 2/9 11/15/2019 02/19/2019  Decreased Interest 0 0  Down, Depressed, Hopeless 0 0  PHQ - 2 Score 0 0    No flowsheet data  found.  -------------------------------------------------------------------------- O: No physical exam performed due to remote telephone encounter.  Lab results reviewed.  Recent Results (from the past 2160 hour(s))  CBC     Status: Abnormal   Collection Time: 11/07/19  9:46 PM  Result Value Ref Range   WBC 12.9 (H) 4.0 - 10.5 K/uL   RBC 4.44 3.87 - 5.11 MIL/uL   Hemoglobin 12.1 12.0 - 15.0 g/dL   HCT 27.0 35.0 - 09.3 %   MCV 87.2 80.0 - 100.0 fL   MCH 27.3 26.0 - 34.0 pg   MCHC 31.3 30.0 - 36.0 g/dL   RDW 81.8 29.9 - 37.1 %   Platelets 465 (H) 150 - 400 K/uL   nRBC 0.0 0.0 - 0.2 %    Comment: Performed at [facility], [address] [place], [place] [address]  Comprehensive metabolic panel     Status: Abnormal   Collection Time: 11/07/19  9:46 PM  Result Value Ref Range   Sodium 137 135 - 145 mmol/L   Potassium 3.4 (L) 3.5 - 5.1 mmol/L   Chloride 106 98 - 111 mmol/L   CO2 19 (L) 22 - 32 mmol/L   Glucose, Bld 80 70 - 99 mg/dL   BUN 6 6 - 20 mg/dL   Creatinine, Ser 9.38 0.44 - 1.00 mg/dL   Calcium 8.8 (L) 8.9 - 10.3 mg/dL   Total Protein 8.0 6.5 - 8.1 g/dL   Albumin 3.6 3.5 - 5.0 g/dL   AST 12 (L) 15 - 41 U/L   ALT 23 0 - 44 U/L   Alkaline Phosphatase 65 38 - 126 U/L   Total Bilirubin 0.9 0.3 - 1.2 mg/dL   GFR calc non Af Amer >60 >60 mL/min   GFR calc Af Amer >60 >60 mL/min   Anion gap 12 5 - 15    Comment: Performed at [facility], [address] [place], [place] [address]  CBC with Differential/Platelet     Status: Abnormal   Collection Time: 11/07/19  9:46 PM  Result Value Ref Range   WBC 12.8 (H) 4.0 - 10.5 K/uL   RBC 4.43 3.87 - 5.11 MIL/uL   Hemoglobin 12.1 12.0 - 15.0 g/dL   HCT 10.2 58.5 - 27.7 %   MCV 87.4 80.0 - 100.0 fL   MCH 27.3 26.0 - 34.0 pg   MCHC 31.3 30.0 - 36.0 g/dL   RDW 82.4 23.5 - 36.1 %   Platelets 476 (H) 150 - 400 K/uL   nRBC 0.0 0.0 - 0.2 %   Neutrophils Relative % 68 %   Neutro Abs 8.9 (H) 1.7 - 7.7 K/uL    Lymphocytes Relative 21 %   Lymphs Abs 2.7 0.7 - 4.0 K/uL   Monocytes Relative 8 %   Monocytes Absolute 1.0 0.1 - 1.0 K/uL   Eosinophils Relative 1 %   Eosinophils Absolute 0.1 0.0 - 0.5 K/uL   Basophils Relative 1 %   Basophils Absolute 0.1 0.0 - 0.1 K/uL  Immature Granulocytes 1 %   Abs Immature Granulocytes 0.07 0.00 - 0.07 K/uL    Comment: Performed at [facility], [address] [place], [place] [address]  Lactic acid, plasma     Status: None   Collection Time: 11/07/19 10:11 PM  Result Value Ref Range   Lactic Acid, Venous 0.8 0.5 - 1.9 mmol/L    Comment: Performed at [facility], [address] [place], [place] [address]  APTT     Status: Abnormal   Collection Time: 11/07/19 10:11 PM  Result Value Ref Range   aPTT 43 (H) 24 - 36 seconds    Comment:        IF BASELINE aPTT IS ELEVATED, SUGGEST PATIENT RISK ASSESSMENT BE USED TO DETERMINE APPROPRIATE ANTICOAGULANT THERAPY. Performed at [facility], [address] [place], [place] [address]   Protime-INR     Status: None   Collection Time: 11/07/19 10:11 PM  Result Value Ref Range   Prothrombin Time 14.3 11.4 - 15.2 seconds   INR 1.1 0.8 - 1.2    Comment: (NOTE) INR goal varies based on device and disease states. Performed at [facility], [address] [place], [place] [address]   Blood Culture (routine x 2)     Status: None   Collection Time: 11/07/19 10:11 PM   Specimen: BLOOD  Result Value Ref Range   Specimen Description BLOOD LEFT ANTECUBITAL    Special Requests      BOTTLES DRAWN AEROBIC AND ANAEROBIC Blood Culture adequate volume   Culture      NO GROWTH 5 DAYS Performed at [facility], [address] [place], [place] [address]    Report Status 11/12/2019 FINAL   Blood Culture (routine x 2)     Status: None   Collection Time: 11/07/19 10:11 PM   Specimen: BLOOD  Result Value Ref Range   Specimen Description BLOOD RIGHT ANTECUBITAL    Special  Requests      BOTTLES DRAWN AEROBIC AND ANAEROBIC Blood Culture adequate volume   Culture      NO GROWTH 5 DAYS Performed at [facility], [address] [place], [place] [address]    Report Status 11/12/2019 FINAL   Lactic acid, plasma     Status: None   Collection Time: 11/08/19 12:18 AM  Result Value Ref Range   Lactic Acid, Venous 0.8 0.5 - 1.9 mmol/L    Comment: Performed at [facility], [address] [place], [place] [address]  Urinalysis, Routine w reflex microscopic     Status: Abnormal   Collection Time: 11/08/19 12:18 AM  Result Value Ref Range   Color, Urine YELLOW (A) YELLOW   APPearance CLEAR (A) CLEAR   Specific Gravity, Urine 1.026 1.005 - 1.030   pH 5.0 5.0 - 8.0   Glucose, UA NEGATIVE NEGATIVE mg/dL   Hgb urine dipstick NEGATIVE NEGATIVE   Bilirubin Urine NEGATIVE NEGATIVE   Ketones, ur 80 (A) NEGATIVE mg/dL   Protein, ur 229 (A) NEGATIVE mg/dL   Nitrite NEGATIVE NEGATIVE   Leukocytes,Ua NEGATIVE NEGATIVE   RBC / HPF 0-5 0 - 5 RBC/hpf   WBC, UA 0-5 0 - 5 WBC/hpf   Bacteria, UA NONE SEEN NONE SEEN   Squamous Epithelial / LPF 0-5 0 - 5   Mucus PRESENT     Comment: Performed at [facility], [address], [place], [place] [address]  Urine culture     Status: None   Collection  Time: 11/08/19 12:18 AM   Specimen: Urine, Random  Result Value Ref Range   Specimen Description      URINE, RANDOM Performed at [facility], [address] [place], [place] [address]    Special Requests      NONE Performed at [facility], [address] [place], [place] [address]    Culture      NO GROWTH Performed at [facility], [address] [place], [place] [address]    Report Status 11/08/2019 FINAL   Respiratory Panel by RT PCR (Flu A&B, Covid) - Nasopharyngeal Swab     Status: None   Collection Time: 11/08/19 12:18 AM   Specimen: Nasopharyngeal Swab  Result Value Ref Range   SARS Coronavirus 2 by RT PCR  NEGATIVE NEGATIVE    Comment: (NOTE) SARS-CoV-2 target nucleic acids are NOT DETECTED. The SARS-CoV-2 RNA is generally detectable in upper respiratoy specimens during the acute phase of infection. The lowest concentration of SARS-CoV-2 viral copies this assay can detect is 131 copies/mL. A negative result does not preclude SARS-Cov-2 infection and should not be used as the sole basis for treatment or other patient management decisions. A negative result may occur with  improper specimen collection/handling, submission of specimen other than nasopharyngeal swab, presence of viral mutation(s) within the areas targeted by this assay, and inadequate number of viral copies (<131 copies/mL). A negative result must be combined with clinical observations, patient history, and epidemiological information. The expected result is Negative. Fact Sheet for Patients:  https://www.moore.com/https://www.fda.gov/media/142436/download Fact Sheet for Healthcare Providers:  https://www.young.biz/https://www.fda.gov/media/142435/download This test is not yet ap proved or cleared by the [place] FDA and  has been authorized for detection and/or diagnosis of SARS-CoV-2 by FDA under an Emergency Use Authorization (EUA). This EUA will remain  in effect (meaning this test can be used) for the duration of the COVID-19 declaration under Section 564(b)(1) of the Act, 21 U.S.C. section 360bbb-3(b)(1), unless the authorization is terminated or revoked sooner.    Influenza A by PCR NEGATIVE NEGATIVE   Influenza B by PCR NEGATIVE NEGATIVE    Comment: (NOTE) The Xpert Xpress SARS-CoV-2/FLU/RSV assay is intended as an aid in  the diagnosis of influenza from Nasopharyngeal swab specimens and  should not be used as a sole basis for treatment. Nasal washings and  aspirates are unacceptable for Xpert Xpress SARS-CoV-2/FLU/RSV  testing. Fact Sheet for Patients: https://www.moore.com/https://www.fda.gov/media/142436/download Fact Sheet for Healthcare  Providers: https://www.young.biz/https://www.fda.gov/media/142435/download This test is not yet approved or cleared by the [place] FDA and  has been authorized for detection and/or diagnosis of SARS-CoV-2 by  FDA under an Emergency Use Authorization (EUA). This EUA will remain  in effect (meaning this test can be used) for the duration of the  Covid-19 declaration under Section 564(b)(1) of the Act, 21  U.S.C. section 360bbb-3(b)(1), unless the authorization is  terminated or revoked. Performed at [facility], [address] [place], [place] [address]   Pregnancy, urine POC     Status: None   Collection Time: 11/08/19 12:39 AM  Result Value Ref Range   Preg Test, Ur NEGATIVE NEGATIVE    Comment:        THE SENSITIVITY OF THIS METHODOLOGY IS >24 mIU/mL     -------------------------------------------------------------------------- A&P:  Problem List Items Addressed This Visit    None    Visit Diagnoses    Yeast vaginitis    -  Primary   Relevant Medications   fluconazole (DIFLUCAN) 150 MG tablet  Antibiotic-induced yeast infection       Relevant Medications   fluconazole (DIFLUCAN) 150 MG tablet     Clinically consistent yeast vaginitis based on history Exposure to antibiotics triggering it  Plan: 1. Start Diflucan 150mg  PO x 1 tab, repeat dose on Day 3 if persistent symptoms, added refill given her recurrence of yeast infection in past   Meds ordered this encounter  Medications  . fluconazole (DIFLUCAN) 150 MG tablet    Sig: Take one tablet by mouth on Day 1. Repeat dose 2nd tablet on Day 3.    Dispense:  2 tablet    Refill:  2    Follow-up: - Return as needed if not resolved.  Patient verbalizes understanding with the above medical recommendations including the limitation of remote medical advice.  Specific follow-up and call-back criteria were given for patient to follow-up or seek medical care more urgently if needed.   - Time spent in direct consultation  with patient on phone: 6 minutes  Nobie Putnam, [facility] 11/15/2019, 4:24 PM

## 2019-11-15 NOTE — Patient Instructions (Addendum)
Start Diflucan as prescribed, take 1 skip a day then take 2nd pill, have refills if need Follow up if new concerns or changes. Or not improving.  Please schedule a Follow-up Appointment to: Return in about 1 week (around 11/22/2019), or if symptoms worsen or fail to improve, for yeast.  If you have any other questions or concerns, please feel free to call the office or send a message through MyChart. You may also schedule an earlier appointment if necessary.  Additionally, you may be receiving a survey about your experience at our office within a few days to 1 week by e-mail or mail. We value your feedback.  Saralyn Pilar, DO [facility], [place]

## 2019-11-20 DIAGNOSIS — L03119 Cellulitis of unspecified part of limb: Secondary | ICD-10-CM | POA: Diagnosis not present

## 2019-11-26 DIAGNOSIS — Z9884 Bariatric surgery status: Secondary | ICD-10-CM | POA: Diagnosis not present

## 2019-11-26 DIAGNOSIS — Z713 Dietary counseling and surveillance: Secondary | ICD-10-CM | POA: Diagnosis not present

## 2020-01-14 ENCOUNTER — Ambulatory Visit: Payer: Medicaid Other

## 2020-01-16 ENCOUNTER — Ambulatory Visit: Payer: Self-pay

## 2020-01-19 ENCOUNTER — Ambulatory Visit: Payer: Medicaid Other | Attending: Internal Medicine

## 2020-01-19 DIAGNOSIS — Z23 Encounter for immunization: Secondary | ICD-10-CM

## 2020-01-19 NOTE — Progress Notes (Signed)
   Covid-19 Vaccination Clinic  Name:  ATINA FEELEY    MRN: [id number] DOB: Feb 23, 1986  01/19/2020  Ms. Linz was observed post Covid-19 immunization for 15 minutes without incident. She was provided with Vaccine Information Sheet and instruction to access the V-Safe system.   Ms. Justin was instructed to call 911 with any severe reactions post vaccine: Marland Kitchen Difficulty breathing  . Swelling of face and throat  . A fast heartbeat  . A bad rash all over body  . Dizziness and weakness   Immunizations Administered    Name Date Dose VIS Date Route   Pfizer COVID-19 Vaccine 01/19/2020 10:32 AM 0.3 mL 10/01/2019 Intramuscular   Manufacturer: [employer], [employer]   Lot: [id number]   NDC: [address]-2

## 2020-02-03 ENCOUNTER — Other Ambulatory Visit: Payer: Self-pay | Admitting: Obstetrics & Gynecology

## 2020-02-03 ENCOUNTER — Telehealth: Payer: Self-pay

## 2020-02-03 MED ORDER — CYANOCOBALAMIN 1000 MCG/ML IJ SOLN: INTRAMUSCULAR | 1 refills | Status: DC

## 2020-02-03 NOTE — Telephone Encounter (Signed)
Pt calling for a refill on her B12, pt is scheduling her annual today,

## 2020-02-04 ENCOUNTER — Other Ambulatory Visit: Payer: Self-pay | Admitting: Advanced Practice Midwife

## 2020-02-04 ENCOUNTER — Telehealth: Payer: Self-pay

## 2020-02-04 ENCOUNTER — Other Ambulatory Visit: Payer: Self-pay

## 2020-02-04 ENCOUNTER — Ambulatory Visit (INDEPENDENT_AMBULATORY_CARE_PROVIDER_SITE_OTHER): Payer: Medicaid Other

## 2020-02-04 DIAGNOSIS — E538 Deficiency of other specified B group vitamins: Secondary | ICD-10-CM | POA: Diagnosis not present

## 2020-02-04 MED ORDER — CYANOCOBALAMIN 1000 MCG/ML IJ SOLN
1000.0000 ug | Freq: Once | INTRAMUSCULAR | Status: AC
  Administered 2020-02-04: 1000 ug via INTRAMUSCULAR

## 2020-02-04 NOTE — Telephone Encounter (Signed)
Can you send in a rx for syringes for B12 shots, since [facility] is not in the office today?

## 2020-02-04 NOTE — Telephone Encounter (Signed)
Let me know if something else needs to be done. Thanks

## 2020-02-13 ENCOUNTER — Ambulatory Visit: Payer: Self-pay

## 2020-02-15 ENCOUNTER — Other Ambulatory Visit: Payer: Self-pay | Admitting: Obstetrics & Gynecology

## 2020-02-15 MED ORDER — SYRINGE 2-3 ML 3 ML MISC: 1.0000 mL | 1 refills | Status: DC

## 2020-02-15 MED ORDER — NOVOFINE 32G X 6 MM MISC: 1.0000 "application " | Freq: Every day | 3 refills | Status: DC

## 2020-02-16 ENCOUNTER — Ambulatory Visit: Payer: Medicaid Other | Admitting: Obstetrics & Gynecology

## 2020-02-24 ENCOUNTER — Other Ambulatory Visit (HOSPITAL_COMMUNITY)
Admission: RE | Admit: 2020-02-24 | Discharge: 2020-02-24 | Disposition: A | Discharge status: Home / Self Care | Payer: Medicaid Other | Source: Ambulatory Visit | Attending: Obstetrics & Gynecology | Admitting: Obstetrics & Gynecology

## 2020-02-24 ENCOUNTER — Ambulatory Visit (INDEPENDENT_AMBULATORY_CARE_PROVIDER_SITE_OTHER): Payer: Medicaid Other | Admitting: Obstetrics & Gynecology

## 2020-02-24 ENCOUNTER — Other Ambulatory Visit: Payer: Self-pay

## 2020-02-24 ENCOUNTER — Encounter: Payer: Self-pay | Admitting: Obstetrics & Gynecology

## 2020-02-24 VITALS — BP 120/80 | Ht 66.0 in | Wt 208.0 lb

## 2020-02-24 DIAGNOSIS — Z01419 Encounter for gynecological examination (general) (routine) without abnormal findings: Secondary | ICD-10-CM

## 2020-02-24 DIAGNOSIS — Z124 Encounter for screening for malignant neoplasm of cervix: Secondary | ICD-10-CM

## 2020-02-24 DIAGNOSIS — Z113 Encounter for screening for infections with a predominantly sexual mode of transmission: Secondary | ICD-10-CM | POA: Diagnosis not present

## 2020-02-24 DIAGNOSIS — Z0000 Encounter for general adult medical examination without abnormal findings: Secondary | ICD-10-CM

## 2020-02-24 DIAGNOSIS — L680 Hirsutism: Secondary | ICD-10-CM

## 2020-02-24 MED ORDER — ESTRADIOL 0.025 MG/24HR TD PTTW: 1.0000 | MEDICATED_PATCH | TRANSDERMAL | 12 refills | Status: DC

## 2020-02-24 NOTE — Progress Notes (Signed)
HPI:      [name] is a 34 y.o. [phone] who LMP was No LMP recorded. Patient has had a hysterectomy., she presents today for her annual examination. The patient has no complaints today other than new sx's rel;ated to her weight loss following gastric bypass surgery such as acne, hirsutism, dyspareuinia, and pH imbalance.. The patient is sexually active. Her last pap: approximate date 2018 and was normal. The patient does perform self breast exams.  There is no notable family history of breast or ovarian cancer in her family.  The patient has regular exercise: yes.  The patient denies current symptoms of depression.    GYN History: Contraception: status post hysterectomy  PMHx: Past Medical History:  Diagnosis Date  . Abnormal Pap smear of cervix 05/01/2016  . Anemia   . Anxiety   . Complication of anesthesia    ITCHING  . Diabetes mellitus without complication (HCC)    WAS ON METFORMIN BUT A1C HAS DECREASED SO MD TOOK PT OFF-LAST A1C ON 03-2017 WAS 5.7  . GERD (gastroesophageal reflux disease)    NO MEDS  . Hyperthyroidism    Past Surgical History:  Procedure Laterality Date  . BARIATRIC SURGERY    . CESAREAN SECTION     X 4  . CHOLECYSTECTOMY    . COLPOSCOPY  06/18/2016  . CYSTOSCOPY N/A 04/15/2017   Procedure: CYSTOSCOPY;  Surgeon: Nadara Mustard, MD;  Location: [facility] ORS;  Service: Gynecology;  Laterality: N/A;  . LAPAROSCOPIC HYSTERECTOMY N/A 04/15/2017   Ovaries remain - Procedure: HYSTERECTOMY TOTAL LAPAROSCOPIC;  Surgeon: Nadara Mustard, MD;  Location: [facility] ORS;  Service: Gynecology;  Laterality: N/A;  . TONSILLECTOMY    . TUBAL LIGATION    . TUBAL LIGATION    . UPPER GASTROINTESTINAL ENDOSCOPY     Family History  Problem Relation Age of Onset  . Ataxia Mother        spinal cerebellar ataxia  . Stroke Father        ischemic  . Ataxia Sister   . Ataxia Brother   . Autism Son   . Autism Son   . Healthy Son   . Developmental delay Son    Social  History   Tobacco Use  . Smoking status: Never Smoker  . Smokeless tobacco: Never Used  Substance Use Topics  . Alcohol use: No  . Drug use: No    Current Outpatient Medications:  .  cyanocobalamin (,VITAMIN B-12,) 1000 MCG/ML injection, INJECT 1 ML (1,000 MCG TOTAL) INTO THE MUSCLE ONCE FOR 1 DOSE. REPEAT MONTHLY., Disp: 1 mL, Rfl: 1 .  omeprazole (PRILOSEC) 20 MG capsule, Take 20 mg by mouth daily., Disp: , Rfl:  .  Probiotic Product (PROBIOTIC PO), Take 1 tablet by mouth daily., Disp: , Rfl:  .  promethazine (PHENERGAN) 25 MG tablet, Take 25 mg by mouth every 6 (six) hours as needed., Disp: , Rfl:  .  senna (SENOKOT) 8.6 MG TABS tablet, Take 1 tablet by mouth 2 (two) times daily., Disp: , Rfl:  .  Syringe, Disposable, (2-3CC SYRINGE) 3 ML MISC, 1 mL by Does not apply route every 30 (thirty) days., Disp: 25 each, Rfl: 1 .  ursodiol (ACTIGALL) 250 MG tablet, Take 250 mg by mouth 2 (two) times daily., Disp: , Rfl:  .  baclofen (LIORESAL) 10 MG tablet, Take 0.5-1 tablets (5-10 mg total) by mouth 3 (three) times daily as needed for muscle spasms. (Patient not taking: Reported on 11/15/2019), Disp:  30 each, Rfl: 0 .  ciclopirox (PENLAC) 8 % solution, APPLY TOPICALLY NIGHTLY APPLY OVER NAIL AND SURROUNDING SKIN., Disp: , Rfl:  .  estradiol (VIVELLE-DOT) 0.025 MG/24HR, Place 1 patch onto the skin 2 (two) times a week., Disp: 8 patch, Rfl: 12 .  fluconazole (DIFLUCAN) 150 MG tablet, Take one tablet by mouth on Day 1. Repeat dose 2nd tablet on Day 3., Disp: 2 tablet, Rfl: 2 .  gabapentin (NEURONTIN) 100 MG capsule, Take 1 capsule (100 mg total) by mouth at bedtime. (Patient not taking: Reported on 07/22/2019), Disp: 90 capsule, Rfl: 1 .  Insulin Pen Needle (NOVOFINE) 32G X 6 MM MISC, 1 application by Does not apply route daily. (Patient not taking: Reported on 02/24/2020), Disp: 50 each, Rfl: 3 .  sucralfate (CARAFATE) 1 g tablet, Take by mouth., Disp: , Rfl:  .  traMADol (ULTRAM) 50 MG tablet, Take 1  tablet (50 mg total) by mouth every 6 (six) hours as needed. (Patient not taking: Reported on 02/24/2020), Disp: 8 tablet, Rfl: 0 Allergies: Eggs or egg-derived products and Lactose  Review of Systems  Constitutional: Positive for weight loss. Negative for chills, fever and malaise/fatigue.  HENT: Negative for congestion, sinus pain and sore throat.   Eyes: Negative for blurred vision and pain.  Respiratory: Negative for cough and wheezing.   Cardiovascular: Negative for chest pain and leg swelling.  Gastrointestinal: Negative for abdominal pain, constipation, diarrhea, heartburn, nausea and vomiting.  Genitourinary: Negative for dysuria, frequency, hematuria and urgency.  Musculoskeletal: Negative for back pain, joint pain, myalgias and neck pain.  Skin: Negative for itching and rash.  Neurological: Negative for dizziness, tremors and weakness.  Endo/Heme/Allergies: Does not bruise/bleed easily.  Psychiatric/Behavioral: Negative for depression. The patient is not nervous/anxious and does not have insomnia.     Objective: BP 120/80   Ht 5\' 6"  (1.676 m)   Wt 208 lb (94.3 kg)   BMI 33.57 kg/m   Filed Weights   02/24/20 1043  Weight: 208 lb (94.3 kg)   Body mass index is 33.57 kg/m. Physical Exam Constitutional:      General: She is not in acute distress.    Appearance: She is well-developed.  Genitourinary:     Pelvic exam was performed with patient supine.     Vagina and rectum normal.     No lesions in the vagina.     No vaginal bleeding.     No right or left adnexal mass present.     Right adnexa not tender.     Left adnexa not tender.     Genitourinary Comments: Absent Uterus Absent cervix Vaginal cuff well healed  HENT:     Head: Normocephalic and atraumatic. No laceration.     Right Ear: Hearing normal.     Left Ear: Hearing normal.     Mouth/Throat:     Pharynx: Uvula midline.  Eyes:     Pupils: Pupils are equal, round, and reactive to light.  Neck:      Thyroid: No thyromegaly.  Cardiovascular:     Rate and Rhythm: Normal rate and regular rhythm.     Heart sounds: No murmur. No friction rub. No gallop.   Pulmonary:     Effort: Pulmonary effort is normal. No respiratory distress.     Breath sounds: Normal breath sounds. No wheezing.  Chest:     Breasts:        Right: No mass, skin change or tenderness.  Left: No mass, skin change or tenderness.  Abdominal:     General: Bowel sounds are normal. There is no distension.     Palpations: Abdomen is soft.     Tenderness: There is no abdominal tenderness. There is no rebound.  Musculoskeletal:        General: Normal range of motion.     Cervical back: Normal range of motion and neck supple.  Neurological:     Mental Status: She is alert and oriented to person, place, and time.     Cranial Nerves: No cranial nerve deficit.  Skin:    General: Skin is warm and dry.  Psychiatric:        Judgment: Judgment normal.  Vitals reviewed.     Assessment:  ANNUAL EXAM 1. Women's annual routine gynecological examination   2. Screening for cervical cancer   3. Hirsutism   4. Screen for STD (sexually transmitted disease)      Screening Plan:            1.  Cervical Screening-  Pap smear done today  2. Breast screening- Exam annually and mammogram>40 planned   3. Colonoscopy every 10 years, Hemoccult testing - after age 19  4. Labs - STD screen and also for BV  5. Counseling for contraception: none needed   6. Hormone sx's including Dysparenia, Hirsutism Plan low dose hormone patch - estradiol (VIVELLE-DOT) 0.025 MG/24HR; Place 1 patch onto the skin 2 (two) times a week.  Dispense: 8 patch; Refill: 12    F/U  Return in about 1 year (around 02/23/2021) for Annual.  Annamarie Major, MD, [facility], [facility] 02/24/2020  11:06 AM

## 2020-02-24 NOTE — Patient Instructions (Signed)
Estradiol skin patches What is this medicine? ESTRADIOL (es tra DYE ole) skin patches contain an estrogen. It is mostly used as hormone replacement in menopausal women. It helps to treat hot flashes and prevent osteoporosis. It is also used to treat women with low estrogen levels or those who have had their ovaries removed. This medicine may be used for other purposes; ask your health care provider or pharmacist if you have questions. COMMON BRAND NAME(S): Alora, Climara, DOTTI, Esclim, Estraderm, FemPatch, Menostar, Minivelle, Vivelle, Vivelle-Dot What should I tell my health care provider before I take this medicine? They need to know if you have any of these conditions:  abnormal vaginal bleeding  blood vessel disease or blood clots  breast, cervical, endometrial, ovarian, liver, or uterine cancer  dementia  diabetes  gallbladder disease  heart disease or recent heart attack  high blood pressure  high cholesterol  high level of calcium in the blood  hysterectomy  kidney disease  liver disease  migraine headaches  protein C deficiency  protein S deficiency  stroke  systemic lupus erythematosus (SLE)  tobacco smoker  an unusual or allergic reaction to estrogens, other hormones, medicines, foods, dyes, or preservatives  pregnant or trying to get pregnant  breast-feeding How should I use this medicine? This medicine is for external use only. Follow the directions on the prescription label. Tear open the pouch, do not use scissors. Remove the stiff protective liner covering the adhesive. Try not to touch the adhesive. Apply the patch, sticky side to the skin, to an area that is clean, dry and hairless. Avoid injured, irritated, calloused, or scarred areas. Do not apply the skin patches to your breasts or around the waistline. Use a different site each time to prevent skin irritation. Do not cut or trim the patch. Do not stop using except on the advice of your doctor  or health care professional. Do not wear more than one patch at a time unless you are told to do so by your doctor or health care professional. Contact your pediatrician regarding the use of this medicine in children. Special care may be needed. A patient package insert for the product will be given with each prescription and refill. Read this sheet carefully each time. The sheet may change frequently. Overdosage: If you think you have taken too much of this medicine contact a poison control center or emergency room at once. NOTE: This medicine is only for you. Do not share this medicine with others. What if I miss a dose? If you miss a dose, apply it as soon as you can. If it is almost time for your next dose, apply only that dose. Do not apply double or extra doses. What may interact with this medicine? Do not take this medicine with any of the following medications:  aromatase inhibitors like aminoglutethimide, anastrozole, exemestane, letrozole, testolactone This medicine may also interact with the following medications:  carbamazepine  certain antibiotics used to treat infections  certain barbiturates used for inducing sleep or treating seizures  grapefruit juice  medicines for fungus infections like itraconazole and ketoconazole  raloxifene or tamoxifen  rifabutin, rifampin, or rifapentine  ritonavir  St. John's Wort This list may not describe all possible interactions. Give your health care provider a list of all the medicines, herbs, non-prescription drugs, or dietary supplements you use. Also tell them if you smoke, drink alcohol, or use illegal drugs. Some items may interact with your medicine. What should I watch for while using this   medicine? Visit your doctor or health care professional for regular checks on your progress. You will need a regular breast and pelvic exam and Pap smear while on this medicine. You should also discuss the need for regular mammograms with your  health care professional, and follow his or her guidelines for these tests. This medicine can make your body retain fluid, making your fingers, hands, or ankles swell. Your blood pressure can go up. Contact your doctor or health care professional if you feel you are retaining fluid. If you have any reason to think you are pregnant, stop taking this medicine right away and contact your doctor or health care professional. Smoking increases the risk of getting a blood clot or having a stroke while you are taking this medicine, especially if you are more than 32 years old. You are strongly advised not to smoke. If you wear contact lenses and notice visual changes, or if the lenses begin to feel uncomfortable, consult your eye doctor or health care professional. This medicine can increase the risk of developing a condition (endometrial hyperplasia) that may lead to cancer of the lining of the uterus. Taking progestins, another hormone drug, with this medicine lowers the risk of developing this condition. Therefore, if your uterus has not been removed (by a hysterectomy), your doctor may prescribe a progestin for you to take together with your estrogen. You should know, however, that taking estrogens with progestins may have additional health risks. You should discuss the use of estrogens and progestins with your health care professional to determine the benefits and risks for you. If you are going to have surgery or an MRI, you may need to stop taking this medicine. Consult your health care professional for advice before you schedule the surgery. Contact with water while you are swimming, using a sauna, bathing, or showering may cause the patch to fall off. If your patch falls off reapply it. If you cannot reapply the patch, apply a new patch to another area and continue to follow your usual dose schedule. What side effects may I notice from receiving this medicine? Side effects that you should report to your  doctor or health care professional as soon as possible:  allergic reactions like skin rash, itching or hives, swelling of the face, lips, or tongue  breast tissue changes or discharge  changes in vision  chest pain  confusion, trouble speaking or understanding  dark urine  general ill feeling or flu-like symptoms  light-colored stools  nausea, vomiting  pain, swelling, warmth in the leg  right upper belly pain  severe headaches  shortness of breath  sudden numbness or weakness of the face, arm or leg  trouble walking, dizziness, loss of balance or coordination  unusual vaginal bleeding  yellowing of the eyes or skin Side effects that usually do not require medical attention (report to your doctor or health care professional if they continue or are bothersome):  hair loss  increased hunger or thirst  increased urination  symptoms of vaginal infection like itching, irritation or unusual discharge  unusually weak or tired This list may not describe all possible side effects. Call your doctor for medical advice about side effects. You may report side effects to FDA at 1-800-FDA-1088. Where should I keep my medicine? Keep out of the reach of children. Store at room temperature below 30 degrees C (86 degrees F). Do not store any patches that have been removed from their protective pouch. Throw away any unused medicine after the   expiration date. Dispose of used patches properly. Since used patches may still contain active hormones, fold the patch in half so that it sticks to itself prior to disposal. NOTE: This sheet is a summary. It may not cover all possible information. If you have questions about this medicine, talk to your doctor, pharmacist, or health care provider.  2020 Elsevier/Gold Standard (2016-04-30 12:58:11)  

## 2020-02-25 ENCOUNTER — Other Ambulatory Visit: Payer: Self-pay | Admitting: Obstetrics & Gynecology

## 2020-02-25 ENCOUNTER — Telehealth: Payer: Self-pay | Admitting: Obstetrics & Gynecology

## 2020-02-25 LAB — CERVICOVAGINAL ANCILLARY ONLY
Bacterial Vaginitis (gardnerella): NEGATIVE
Candida Glabrata: NEGATIVE
Candida Vaginitis: NEGATIVE
Chlamydia: POSITIVE — AB
Comment: NEGATIVE
Comment: NEGATIVE
Comment: NEGATIVE
Comment: NEGATIVE
Comment: NEGATIVE
Comment: NORMAL
Neisseria Gonorrhea: NEGATIVE
Trichomonas: NEGATIVE

## 2020-02-25 LAB — CYTOLOGY - PAP
Comment: NEGATIVE
Diagnosis: NEGATIVE
High risk HPV: NEGATIVE

## 2020-02-25 MED ORDER — AZITHROMYCIN 500 MG PO TABS: 1000.0000 mg | ORAL_TABLET | Freq: Once | ORAL | 1 refills | Status: AC

## 2020-02-28 ENCOUNTER — Other Ambulatory Visit: Payer: Self-pay | Admitting: Obstetrics & Gynecology

## 2020-02-28 NOTE — Telephone Encounter (Signed)
Patient states she is trying to get a refill of her Antibiotic, but the pharmacy will not fill it due to no diagnosis code. Patient states she really needs it. QH#[phone]

## 2020-02-28 NOTE — Telephone Encounter (Signed)
Spoke w/patient. Advised that Refill request that had been received from pharmacy was for her b12. She states that has been filled, she just has to go pick it up. She is requesting a refill of her Azythromycin. She has picked up her rx, and asking for refill so her partner can be treated also. Therefore an additional rx will need to be sent in.

## 2020-02-28 NOTE — Telephone Encounter (Signed)
Rx w refill on original Rx.  I dont need to send in another Rx for Azithromycin

## 2020-02-29 ENCOUNTER — Other Ambulatory Visit: Payer: Self-pay | Admitting: Obstetrics & Gynecology

## 2020-02-29 MED ORDER — AZITHROMYCIN 500 MG PO TABS: 1000.0000 mg | ORAL_TABLET | Freq: Once | ORAL | 1 refills | Status: AC

## 2020-02-29 MED ORDER — SYRINGE 2-3 ML 3 ML MISC: 1.0000 mL | 1 refills | Status: DC

## 2020-03-02 NOTE — Telephone Encounter (Signed)
A new rx was sent 03/01/20

## 2020-03-03 ENCOUNTER — Ambulatory Visit: Payer: Medicaid Other | Admitting: Advanced Practice Midwife

## 2020-03-06 ENCOUNTER — Other Ambulatory Visit: Payer: Self-pay | Admitting: Obstetrics & Gynecology

## 2020-03-16 ENCOUNTER — Other Ambulatory Visit: Payer: Self-pay | Admitting: Obstetrics & Gynecology

## 2020-03-16 DIAGNOSIS — L680 Hirsutism: Secondary | ICD-10-CM

## 2020-04-19 ENCOUNTER — Encounter: Payer: Self-pay | Admitting: Family Medicine

## 2020-04-19 ENCOUNTER — Other Ambulatory Visit: Payer: Self-pay

## 2020-04-19 ENCOUNTER — Ambulatory Visit (INDEPENDENT_AMBULATORY_CARE_PROVIDER_SITE_OTHER): Payer: Medicaid Other | Admitting: Family Medicine

## 2020-04-19 VITALS — BP 113/74 | HR 64 | Temp 97.3°F | Resp 16 | Ht 66.0 in | Wt 198.0 lb

## 2020-04-19 DIAGNOSIS — B49 Unspecified mycosis: Secondary | ICD-10-CM | POA: Insufficient documentation

## 2020-04-19 MED ORDER — NYSTATIN 100000 UNIT/GM EX POWD: 1.0000 "application " | Freq: Three times a day (TID) | CUTANEOUS | 0 refills | Status: DC

## 2020-04-19 NOTE — Assessment & Plan Note (Signed)
Skin fungal infection under excess skin of abdomen.  Discussed to keep clean and dry, to apply nystatin powder up to 3x per day as needed for symptoms.  Plan: 1. Follow up with bariatric surgeon for discussion of excess skin removal.

## 2020-04-19 NOTE — Patient Instructions (Signed)
Can apply the topical nystatin powder to the skin up to 3x per day as needed.  Contact your bariatric surgeon for referral for excess skin removal  We will plan to see you back in 3 months for physical  You will receive a survey after today's visit either digitally by e-mail or paper by USPS mail. Your experiences and feedback matter to [place].  Please respond so we know how we are doing as we provide care for you.  Call us with any questions/concerns/needs.  It is my goal to be available to you for your health concerns.  Thanks for choosing me to be a partner in your healthcare needs!  Charlaine Dalton, FNP-C Family Nurse Practitioner [facility] Phone: [phone]

## 2020-04-19 NOTE — Progress Notes (Signed)
Subjective:    Patient ID: Crystal Mendez, female    DOB: 03/31/1986, 34 y.o.   MRN: [id number]  Crystal Mendez is a 34 y.o. female presenting on 04/19/2020 for Weight Loss   HPI  Ms. Engelken presents to clinic with concerns of excess skin s/p bariatric surgery and having to use diaper rash cream throughout the day for skin itching and odor under excess skin.  Has not yet followed up with bariatrics to discuss excess skin removal or set up an evaluation for this as of yet.  No other acute concerns today.  Depression screen [facility] 2/9 11/15/2019 02/19/2019  Decreased Interest 0 0  Down, Depressed, Hopeless 0 0  PHQ - 2 Score 0 0    Social History   Tobacco Use  . Smoking status: Never Smoker  . Smokeless tobacco: Never Used  Vaping Use  . Vaping Use: Never used  Substance Use Topics  . Alcohol use: No  . Drug use: No    Review of Systems  Constitutional: Negative.   HENT: Negative.   Eyes: Negative.   Respiratory: Negative.   Cardiovascular: Negative.   Gastrointestinal: Negative.   Endocrine: Negative.   Genitourinary: Negative.   Musculoskeletal: Negative.   Skin: Positive for rash. Negative for color change, pallor and wound.  Allergic/Immunologic: Negative.   Neurological: Negative.   Hematological: Negative.   Psychiatric/Behavioral: Negative.    Per HPI unless specifically indicated above     Objective:    BP 113/74   Pulse 64   Temp (!) 97.3 F (36.3 C) (Temporal)   Resp 16   Ht 5\' 6"  (1.676 m)   Wt 198 lb (89.8 kg)   SpO2 100%   BMI 31.96 kg/m   Wt Readings from Last 3 Encounters:  04/19/20 198 lb (89.8 kg)  02/24/20 208 lb (94.3 kg)  11/07/19 250 lb (113.4 kg)    Physical Exam Vitals reviewed.  Constitutional:      General: She is not in acute distress.    Appearance: Normal appearance. She is well-developed and well-groomed. She is obese. She is not ill-appearing or toxic-appearing.  HENT:     Head: Normocephalic and atraumatic.      Nose:     Comments: 11/09/19 is in place, covering mouth and nose. Eyes:     General: Lids are normal. Vision grossly intact.        Right eye: No discharge.        Left eye: No discharge.     Extraocular Movements: Extraocular movements intact.     Conjunctiva/sclera: Conjunctivae normal.     Pupils: Pupils are equal, round, and reactive to light.  Cardiovascular:     Pulses: Normal pulses.          Dorsalis pedis pulses are 2+ on the right side and 2+ on the left side.  Pulmonary:     Effort: Pulmonary effort is normal. No respiratory distress.  Musculoskeletal:     Right lower leg: No edema.     Left lower leg: No edema.  Skin:    General: Skin is warm and dry.     Capillary Refill: Capillary refill takes less than 2 seconds.     Findings: Rash present.     Comments: Circular rash with central clearing under excess skin of abdomen, yeast like in appearance  Neurological:     General: No focal deficit present.     Mental Status: She is alert and oriented to person, place, and  time.  Psychiatric:        Attention and Perception: Attention and perception normal.        Mood and Affect: Mood and affect normal.        Speech: Speech normal.        Behavior: Behavior normal. Behavior is cooperative.        Thought Content: Thought content normal.        Cognition and Memory: Cognition and memory normal.        Judgment: Judgment normal.    Results for orders placed or performed in visit on 02/24/20  Cytology - PAP  Result Value Ref Range   High risk HPV Negative    Adequacy Satisfactory for evaluation.    Diagnosis      - Negative for intraepithelial lesion or malignancy (NILM)   Comment Normal Reference Range HPV - Negative   Cervicovaginal ancillary only  Result Value Ref Range   Neisseria Gonorrhea Negative    Chlamydia Positive (A)    Trichomonas Negative    Bacterial Vaginitis (gardnerella) Negative    Candida Vaginitis Negative    Candida Glabrata Negative      Comment Normal Reference Range Candida Species - Negative    Comment Normal Reference Range Candida Galbrata - Negative    Comment Normal Reference Range Trichomonas - Negative    Comment      Normal Reference Range Bacterial Vaginosis - Negative   Comment Normal Reference Ranger Chlamydia - Negative    Comment      Normal Reference Range Neisseria Gonorrhea - Negative      Assessment & Plan:   Problem List Items Addressed This Visit      Other   Fungal infection - Primary    Skin fungal infection under excess skin of abdomen.  Discussed to keep clean and dry, to apply nystatin powder up to 3x per day as needed for symptoms.  Plan: 1. Follow up with bariatric surgeon for discussion of excess skin removal.      Relevant Medications   nystatin (MYCOSTATIN/NYSTOP) powder      Meds ordered this encounter  Medications  . nystatin (MYCOSTATIN/NYSTOP) powder    Sig: Apply 1 application topically 3 (three) times daily.    Dispense:  15 g    Refill:  0      Follow up plan: Return in about 3 months (around 07/20/2020) for CPE.   Charlaine Dalton, FNP Family Nurse Practitioner [facility] 04/19/2020, 12:05 PM

## 2020-04-20 ENCOUNTER — Other Ambulatory Visit: Payer: Self-pay | Admitting: Family Medicine

## 2020-04-20 DIAGNOSIS — L987 Excessive and redundant skin and subcutaneous tissue: Secondary | ICD-10-CM

## 2020-04-20 NOTE — Telephone Encounter (Signed)
Copied from CRM [id number]. Topic: General - Inquiry >> Apr 19, 2020  3:02 PM Dawna Part D wrote: Reason for CRM: PT needs someone to call her back PT stated she was just in the office today

## 2020-04-21 ENCOUNTER — Encounter: Payer: Self-pay | Admitting: Family Medicine

## 2020-05-11 ENCOUNTER — Other Ambulatory Visit: Payer: Self-pay

## 2020-05-11 ENCOUNTER — Ambulatory Visit (INDEPENDENT_AMBULATORY_CARE_PROVIDER_SITE_OTHER): Payer: Medicaid Other | Admitting: Obstetrics and Gynecology

## 2020-05-11 ENCOUNTER — Encounter: Payer: Self-pay | Admitting: Obstetrics and Gynecology

## 2020-05-11 VITALS — BP 126/77 | Ht 66.0 in | Wt 196.0 lb

## 2020-05-11 DIAGNOSIS — N760 Acute vaginitis: Secondary | ICD-10-CM | POA: Diagnosis not present

## 2020-05-11 NOTE — Progress Notes (Signed)
Obstetrics & Gynecology Office Visit   Chief Complaint  Patient presents with  . Vaginitis    vaginal odor, discomfort   History of Present Illness: [name] is a 34 y.o. [phone] who LMP was No LMP recorded. Patient has had a hysterectomy., presents today for a problem visit.   Patient complains of an abnormal vaginal malodorous discharge, "fishy."  This has been present for the past couple of days. She also notes some vaginal itching.  She notes no external issues. She notes occasional discomfort with intercourse. This is nothing new to the last couple of days.  She has had no fever, chills. She was diagnosed with chlamydia in May and she and her partner both treated. She has not had a test of cure.  She states that he has no symptoms.  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   Past Medical History:  Diagnosis Date  . Abnormal Pap smear of cervix 05/01/2016  . Anemia   . Anxiety   . Complication of anesthesia    ITCHING  . GERD (gastroesophageal reflux disease)    NO MEDS  . Hyperthyroidism     Past Surgical History:  Procedure Laterality Date  . BARIATRIC SURGERY    . CESAREAN SECTION     X 4  . CHOLECYSTECTOMY    . COLPOSCOPY  06/18/2016  . CYSTOSCOPY N/A 04/15/2017   Procedure: CYSTOSCOPY;  Surgeon: Nadara Mustard, MD;  Location: [facility] ORS;  Service: Gynecology;  Laterality: N/A;  . LAPAROSCOPIC HYSTERECTOMY N/A 04/15/2017   Ovaries remain - Procedure: HYSTERECTOMY TOTAL LAPAROSCOPIC;  Surgeon: Nadara Mustard, MD;  Location: [facility] ORS;  Service: Gynecology;  Laterality: N/A;  . TONSILLECTOMY    . TUBAL LIGATION    . TUBAL LIGATION    . UPPER GASTROINTESTINAL ENDOSCOPY      Gynecologic History: No LMP recorded. Patient has had a hysterectomy.  Obstetric History: [id number]  Family History  Problem Relation Age of Onset  . Ataxia Mother        spinal cerebellar ataxia  . Stroke Father        ischemic  . Ataxia Sister   . Ataxia Brother     . Autism Son   . Autism Son   . Healthy Son   . Developmental delay Son     Social History   Socioeconomic History  . Marital status: Single    Spouse name: Not on file  . Number of children: Not on file  . Years of education: Not on file  . Highest education level: Not on file  Occupational History  . Not on file  Tobacco Use  . Smoking status: Never Smoker  . Smokeless tobacco: Never Used  Vaping Use  . Vaping Use: Never used  Substance and Sexual Activity  . Alcohol use: No  . Drug use: No  . Sexual activity: Yes    Birth control/protection: Surgical    Comment: Hysterectomy  Other Topics Concern  . Not on file  Social History Narrative  . Not on file   Social Determinants of Health   Financial Resource Strain:   . Difficulty of Paying Living Expenses:   Food Insecurity:   . Worried About Programme researcher, broadcasting/film/video in the Last Year:   . Barista in the Last Year:   Transportation Needs:   . Freight forwarder (Medical):   Marland Kitchen Lack of Transportation (Non-Medical):   Physical Activity:   .  Days of Exercise per Week:   . Minutes of Exercise per Session:   Stress:   . Feeling of Stress :   Social Connections:   . Frequency of Communication with Friends and Family:   . Frequency of Social Gatherings with Friends and Family:   . Attends Religious Services:   . Active Member of Clubs or Organizations:   . Attends Banker Meetings:   Marland Kitchen Marital Status:   Intimate Partner Violence:   . Fear of Current or Ex-Partner:   . Emotionally Abused:   Marland Kitchen Physically Abused:   . Sexually Abused:     Allergies  Allergen Reactions  . Eggs Or Egg-Derived Products Other (See Comments)    Breakout in sweats  . Lactose Nausea And Vomiting    No dairy, cheese, yogurt    Prior to Admission medications   Medication Sig Start Date End Date Taking? Authorizing Provider  ciclopirox (PENLAC) 8 % solution APPLY TOPICALLY NIGHTLY APPLY OVER NAIL AND SURROUNDING  SKIN. 07/17/18  Yes [provider]  cyanocobalamin (,VITAMIN B-12,) 1000 MCG/ML injection INJECT 1 ML (1,000 MCG TOTAL) INTO THE MUSCLE ONCE FOR 1 DOSE. REPEAT MONTHLY. 02/25/20  Yes Nadara Mustard, MD  estradiol (VIVELLE-DOT) 0.025 MG/24HR PLACE 1 PATCH ONTO THE SKIN 2 (TWO) TIMES A WEEK. 03/16/20  Yes Nadara Mustard, MD  nystatin (MYCOSTATIN/NYSTOP) powder Apply 1 application topically 3 (three) times daily. 04/19/20  Yes [name], Jodelle Gross, FNP  omeprazole (PRILOSEC) 20 MG capsule Take 20 mg by mouth daily. 08/25/19  Yes [provider]  Probiotic Product (PROBIOTIC PO) Take 1 tablet by mouth daily.   Yes [provider]  promethazine (PHENERGAN) 25 MG tablet Take 25 mg by mouth every 6 (six) hours as needed. 08/25/19  Yes [provider]  senna (SENOKOT) 8.6 MG TABS tablet Take 1 tablet by mouth 2 (two) times daily. 08/25/19  Yes [provider]  sucralfate (CARAFATE) 1 g tablet Take by mouth. 01/05/19 07/04/19  [provider]    Review of Systems  Constitutional: Negative.   HENT: Negative.   Eyes: Negative.   Respiratory: Negative.   Cardiovascular: Negative.   Gastrointestinal: Negative.   Genitourinary: Negative.        See HPI  Musculoskeletal: Negative.   Skin: Negative.   Neurological: Negative.   Psychiatric/Behavioral: Negative.      Physical Exam BP 126/77   Ht 5\' 6"  (1.676 m)   Wt 196 lb (88.9 kg)   BMI 31.64 kg/m  No LMP recorded. Patient has had a hysterectomy. Physical Exam Constitutional:      General: She is not in acute distress.    Appearance: Normal appearance.  Genitourinary:     Pelvic exam was performed with patient in the lithotomy position.     Vulva, inguinal canal, urethra, bladder and uterus normal.     No lesions in the vagina.     Vaginal discharge (white) present.     No vaginal bleeding, ulceration, atrophy or prolapse.     No cervical motion tenderness.     Uterus is not tender.     No right  or left adnexal mass present.     Right adnexa not tender.     Left adnexa not tender.  HENT:     Head: Normocephalic and atraumatic.  Eyes:     General: No scleral icterus.    Conjunctiva/sclera: Conjunctivae normal.  Neurological:     General: No focal deficit present.  Mental Status: She is alert and oriented to person, place, and time.     Cranial Nerves: No cranial nerve deficit.  Psychiatric:        Mood and Affect: Mood normal.        Behavior: Behavior normal.        Judgment: Judgment normal.   Wet Prep: PH: 4.5 Clue Cells: Negative Fungal elements: Negative Trichomonas: Negative WBC: present   Female chaperone present for pelvic and breast  portions of the physical exam  Assessment: 34 y.o. [id number] female here for  1. Acute vaginitis      Plan: Problem List Items Addressed This Visit    None    Visit Diagnoses    Acute vaginitis    -  Primary   Relevant Orders   NuSwab Vaginitis Plus (VG+)     Recent history of chlamydia. No evidence of BV today. Wills end [employer] as noted above for test of cure and investigation of other STD.  A total of 20 minutes were spent face-to-face with the patient as well as preparation, review, communication, and documentation during this encounter.    Thomasene Mohair, MD 05/11/2020 12:23 PM

## 2020-05-14 LAB — NUSWAB VAGINITIS PLUS (VG+)
Candida albicans, NAA: NEGATIVE
Candida glabrata, NAA: NEGATIVE
Chlamydia trachomatis, NAA: NEGATIVE
Neisseria gonorrhoeae, NAA: NEGATIVE
Trich vag by NAA: NEGATIVE

## 2020-05-29 DIAGNOSIS — E65 Localized adiposity: Secondary | ICD-10-CM | POA: Diagnosis not present

## 2020-05-29 DIAGNOSIS — R7303 Prediabetes: Secondary | ICD-10-CM | POA: Diagnosis not present

## 2020-05-29 DIAGNOSIS — Z9884 Bariatric surgery status: Secondary | ICD-10-CM | POA: Diagnosis not present

## 2020-06-15 ENCOUNTER — Other Ambulatory Visit (HOSPITAL_COMMUNITY)
Admission: RE | Admit: 2020-06-15 | Discharge: 2020-06-15 | Disposition: A | Discharge status: Home / Self Care | Payer: Medicaid Other | Source: Ambulatory Visit | Attending: Obstetrics & Gynecology | Admitting: Obstetrics & Gynecology

## 2020-06-15 ENCOUNTER — Other Ambulatory Visit: Payer: Self-pay

## 2020-06-15 ENCOUNTER — Encounter: Payer: Self-pay | Admitting: Obstetrics & Gynecology

## 2020-06-15 ENCOUNTER — Ambulatory Visit (INDEPENDENT_AMBULATORY_CARE_PROVIDER_SITE_OTHER): Payer: Medicaid Other | Admitting: Obstetrics & Gynecology

## 2020-06-15 VITALS — BP 120/80 | Ht 66.0 in | Wt 190.0 lb

## 2020-06-15 DIAGNOSIS — N898 Other specified noninflammatory disorders of vagina: Secondary | ICD-10-CM

## 2020-06-15 NOTE — Progress Notes (Signed)
HPI:      [name] is a 34 y.o. [phone] who LMP was No LMP recorded. Patient has had a hysterectomy., presents today for a problem visit.  She complains of:  Vaginitis: Patient complains of an abnormal vaginal discharge and itch/burn of a mild variety but constant even despite normal exam and testing one month ago, feels she has some sort of infection.  SHe has used boric acid once and it felt better, but didn't know if she should take long term.  Min d/c now.  PMHx: She  has a past medical history of Abnormal Pap smear of cervix (05/01/2016), Anemia, Anxiety, Complication of anesthesia, GERD (gastroesophageal reflux disease), and Hyperthyroidism. Also,  has a past surgical history that includes Cholecystectomy; Tubal ligation; Tonsillectomy; Tubal ligation; Cesarean section; Colposcopy (06/18/2016); Laparoscopic hysterectomy (N/A, 04/15/2017); Cystoscopy (N/A, 04/15/2017); Bariatric Surgery; and Upper gastrointestinal endoscopy., family history includes Ataxia in her brother, mother, and sister; Autism in her son and son; Developmental delay in her son; Healthy in her son; Stroke in her father.,  reports that she has never smoked. She has never used smokeless tobacco. She reports that she does not drink alcohol and does not use drugs.  She has a current medication list which includes the following prescription(s): ciclopirox, cyanocobalamin, estradiol, nystatin, omeprazole, probiotic product, promethazine, senna, and sucralfate. Also, is allergic to eggs or egg-derived products and lactose.  Review of Systems  All other systems reviewed and are negative.   Objective: BP 120/80   Ht 5\' 6"  (1.676 m)   Wt 190 lb (86.2 kg)   BMI 30.67 kg/m  Physical Exam Constitutional:      General: She is not in acute distress.    Appearance: She is well-developed.  Genitourinary:     Pelvic exam was performed with patient supine.     Vagina and uterus normal.     No vaginal erythema or  bleeding.     No cervical motion tenderness, discharge, polyp or nabothian cyst.     Uterus is mobile.     Uterus is not enlarged.     No uterine mass detected.    Uterus is midaxial.     No right or left adnexal mass present.     Right adnexa not tender.     Left adnexa not tender.  HENT:     Head: Normocephalic and atraumatic.     Nose: Nose normal.  Abdominal:     General: There is no distension.     Palpations: Abdomen is soft.     Tenderness: There is no abdominal tenderness.  Musculoskeletal:        General: Normal range of motion.  Neurological:     Mental Status: She is alert and oriented to person, place, and time.     Cranial Nerves: No cranial nerve deficit.  Skin:    General: Skin is warm and dry.  Psychiatric:        Attention and Perception: Attention normal.        Mood and Affect: Mood and affect normal.        Speech: Speech normal.        Behavior: Behavior normal.        Thought Content: Thought content normal.        Judgment: Judgment normal.     ASSESSMENT/PLAN:    Problem List Items Addressed This Visit    Vaginal discharge    -  Primary Possible recurrent BV   Relevant Orders  Cervicovaginal ancillary only    Boric acid for suppression and pH maintenance discussed and advised; pt has used in past and plans to start twice weekly  Will call w needs for Rx therapy based on results  Annamarie Major, MD, [facility], [facility] 06/15/2020  11:59 AM

## 2020-06-19 DIAGNOSIS — L304 Erythema intertrigo: Secondary | ICD-10-CM | POA: Diagnosis not present

## 2020-06-19 DIAGNOSIS — M25511 Pain in right shoulder: Secondary | ICD-10-CM | POA: Diagnosis not present

## 2020-06-19 DIAGNOSIS — G8929 Other chronic pain: Secondary | ICD-10-CM | POA: Diagnosis not present

## 2020-06-19 DIAGNOSIS — N62 Hypertrophy of breast: Secondary | ICD-10-CM | POA: Diagnosis not present

## 2020-06-19 DIAGNOSIS — M546 Pain in thoracic spine: Secondary | ICD-10-CM | POA: Diagnosis not present

## 2020-06-19 DIAGNOSIS — M25512 Pain in left shoulder: Secondary | ICD-10-CM | POA: Diagnosis not present

## 2020-06-19 DIAGNOSIS — M542 Cervicalgia: Secondary | ICD-10-CM | POA: Diagnosis not present

## 2020-06-19 LAB — CERVICOVAGINAL ANCILLARY ONLY
Bacterial Vaginitis (gardnerella): NEGATIVE
Candida Glabrata: NEGATIVE
Candida Vaginitis: NEGATIVE
Chlamydia: NEGATIVE
Comment: NEGATIVE
Comment: NEGATIVE
Comment: NEGATIVE
Comment: NEGATIVE
Comment: NEGATIVE
Comment: NORMAL
Neisseria Gonorrhea: NEGATIVE
Trichomonas: NEGATIVE

## 2020-07-11 DIAGNOSIS — N6481 Ptosis of breast: Secondary | ICD-10-CM | POA: Diagnosis not present

## 2020-07-11 DIAGNOSIS — L304 Erythema intertrigo: Secondary | ICD-10-CM | POA: Diagnosis not present

## 2020-07-11 DIAGNOSIS — N62 Hypertrophy of breast: Secondary | ICD-10-CM | POA: Diagnosis not present

## 2020-07-11 DIAGNOSIS — L987 Excessive and redundant skin and subcutaneous tissue: Secondary | ICD-10-CM | POA: Diagnosis not present

## 2020-07-11 DIAGNOSIS — M793 Panniculitis, unspecified: Secondary | ICD-10-CM | POA: Diagnosis not present

## 2020-07-11 DIAGNOSIS — N641 Fat necrosis of breast: Secondary | ICD-10-CM | POA: Diagnosis not present

## 2020-07-11 DIAGNOSIS — M549 Dorsalgia, unspecified: Secondary | ICD-10-CM | POA: Diagnosis not present

## 2020-08-23 ENCOUNTER — Other Ambulatory Visit: Payer: Self-pay

## 2020-08-23 ENCOUNTER — Ambulatory Visit
Admission: RE | Admit: 2020-08-23 | Discharge: 2020-08-23 | Disposition: A | Discharge status: Home / Self Care | Payer: Medicaid Other | Source: Home / Self Care | Attending: Family Medicine | Admitting: Family Medicine

## 2020-08-23 ENCOUNTER — Encounter: Payer: Self-pay | Admitting: Family Medicine

## 2020-08-23 ENCOUNTER — Ambulatory Visit
Admission: RE | Admit: 2020-08-23 | Discharge: 2020-08-23 | Disposition: A | Discharge status: Home / Self Care | Payer: Medicaid Other | Source: Ambulatory Visit | Attending: Family Medicine | Admitting: Family Medicine

## 2020-08-23 ENCOUNTER — Ambulatory Visit: Payer: Medicaid Other | Admitting: Family Medicine

## 2020-08-23 VITALS — BP 108/55 | HR 69 | Temp 98.3°F | Ht 66.0 in | Wt 178.8 lb

## 2020-08-23 DIAGNOSIS — F419 Anxiety disorder, unspecified: Secondary | ICD-10-CM | POA: Insufficient documentation

## 2020-08-23 DIAGNOSIS — R109 Unspecified abdominal pain: Secondary | ICD-10-CM | POA: Insufficient documentation

## 2020-08-23 DIAGNOSIS — E559 Vitamin D deficiency, unspecified: Secondary | ICD-10-CM | POA: Diagnosis not present

## 2020-08-23 DIAGNOSIS — R5383 Other fatigue: Secondary | ICD-10-CM | POA: Diagnosis not present

## 2020-08-23 DIAGNOSIS — R0602 Shortness of breath: Secondary | ICD-10-CM | POA: Diagnosis not present

## 2020-08-23 MED ORDER — DICYCLOMINE HCL 10 MG PO CAPS: 10.0000 mg | ORAL_CAPSULE | Freq: Three times a day (TID) | ORAL | 0 refills | Status: DC

## 2020-08-23 MED ORDER — ESCITALOPRAM OXALATE 5 MG PO TABS: 5.0000 mg | ORAL_TABLET | Freq: Every day | ORAL | 1 refills | Status: DC

## 2020-08-23 NOTE — Assessment & Plan Note (Signed)
Increased anxiety s/p plastic surgery.  Discussed starting low dose SSRI along with recommendations for counseling/therapy.  Will send in low dose escitalopram 5mg  to take 1 tablet daily.  To review mood handout.  To return to clinic in 2 weeks for re-evaluation

## 2020-08-23 NOTE — Progress Notes (Signed)
 Subjective:    Patient ID: Crystal Mendez, female    DOB: 03/16/1986, 34 y.o.   MRN: [id number]  Crystal Mendez is a 34 y.o. female presenting on 08/23/2020 for Abdominal Cramping (pt having persistent abdominal cramps that she describe they feel like contractions. x 3 days The symptoms start after eating ice cream.  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  Pt had a gastric bypass x 2 yrs ago), Post-op Problem (pt just had breast lift and excess skin removed from her stomach x 07/11/20), and Panic Attack (pt complains of difficulty breathing when she is out in public )   HPI  [name] presents to clinic for concerns of abdominal cramping x 3 days after having dairy, some shortness of breath, anxiety and fatigue.  Reports having surgery approx 4 weeks ago for excess skin removal after gastric bypass and breast lift.  Has been having episodes of feeling overwhelmed with some shortness of breath and wave of concern in her chest.  Denies DOE, palpitations, LOC, syncope.  Has met with her surgeon after surgery and was notified it may take a few months to get back to presurgical level of function.  Depression screen PHQ 2/9 11/15/2019 02/19/2019  Decreased Interest 0 0  Down, Depressed, Hopeless 0 0  PHQ - 2 Score 0 0    Social History   Tobacco Use  . Smoking status: Never Smoker  . Smokeless tobacco: Never Used  Vaping Use  . Vaping Use: Never used  Substance Use Topics  . Alcohol use: No  . Drug use: No    Review of Systems  Constitutional: Positive for fatigue. Negative for activity change, appetite change, chills, diaphoresis, fever and unexpected weight change.  HENT: Negative.   Eyes: Negative.   Respiratory: Positive for shortness of breath. Negative for apnea, cough, choking, chest tightness, wheezing and stridor.   Cardiovascular: Negative.   Gastrointestinal: Positive for abdominal pain. Negative  for abdominal distention, anal bleeding, blood in stool, constipation, diarrhea, nausea, rectal pain and vomiting.  Endocrine: Negative.   Genitourinary: Negative.   Musculoskeletal: Negative.   Skin: Negative.   Allergic/Immunologic: Negative.   Neurological: Negative.   Hematological: Negative.   Psychiatric/Behavioral: Negative for agitation, behavioral problems, confusion, decreased concentration, dysphoric mood, hallucinations, self-injury, sleep disturbance and suicidal ideas. The patient is nervous/anxious. The patient is not hyperactive.    Per HPI unless specifically indicated above     Objective:    BP (!) 108/55 (BP Location: Right Arm, Patient Position: Sitting, Cuff Size: Normal)   Pulse 69   Temp 98.3 F (36.8 C) (Oral)   Ht 5' 6" (1.676 m)   Wt 178 lb 12.8 oz (81.1 kg)   BMI 28.86 kg/m   Wt Readings from Last 3 Encounters:  08/23/20 178 lb 12.8 oz (81.1 kg)  06/15/20 190 lb (86.2 kg)  05/11/20 196 lb (88.9 kg)    Physical Exam Vitals and nursing note reviewed.  Constitutional:      General: She is not in acute distress.    Appearance: Normal appearance. She is well-developed and well-groomed. She is not ill-appearing or toxic-appearing.  HENT:     Head: Normocephalic and atraumatic.     Nose:     Comments: Facemask is in place, covering mouth and nose. Eyes:     General: Lids are normal. Vision grossly intact.        Right eye: No   discharge.        Left eye: No discharge.     Extraocular Movements: Extraocular movements intact.     Conjunctiva/sclera: Conjunctivae normal.     Pupils: Pupils are equal, round, and reactive to light.  Cardiovascular:     Pulses: Normal pulses.          Dorsalis pedis pulses are 2+ on the right side and 2+ on the left side.  Pulmonary:     Effort: Pulmonary effort is normal. No respiratory distress.  Skin:    General: Skin is warm and dry.     Capillary Refill: Capillary refill takes less than 2 seconds.  Neurological:       General: No focal deficit present.     Mental Status: She is alert and oriented to person, place, and time. Mental status is at baseline.  Psychiatric:        Attention and Perception: Attention and perception normal.        Mood and Affect: Mood and affect normal.        Speech: Speech normal.        Behavior: Behavior normal. Behavior is cooperative.        Thought Content: Thought content normal.        Cognition and Memory: Cognition and memory normal.        Judgment: Judgment normal.    Results for orders placed or performed in visit on 06/15/20  Cervicovaginal ancillary only  Result Value Ref Range   Neisseria Gonorrhea Negative    Chlamydia Negative    Trichomonas Negative    Bacterial Vaginitis (gardnerella) Negative    Candida Vaginitis Negative    Candida Glabrata Negative    Comment Normal Reference Range Candida Species - Negative    Comment Normal Reference Range Candida Galbrata - Negative    Comment Normal Reference Range Trichomonas - Negative    Comment Normal Reference Ranger Chlamydia - Negative    Comment      Normal Reference Range Neisseria Gonorrhea - Negative   Comment      Normal Reference Range Bacterial Vaginosis - Negative      Assessment & Plan:   Problem List Items Addressed This Visit      Other   Abdominal cramping - Primary    Long history of lactose intolerance and since having gastric surgery has dabbled in dairy.  Recently she had ice cream approx 4 days ago and has been having abdominal cramping and diarrhea.  Encouraged follow up with her gastroenterologist and will send in rx for bentyl to help with reduction of abdominal cramping.  Plan: 1. Begin bentyl TID PRN for abdominal cramping 2. Contact gastroenterology for follow up visit      Relevant Medications   dicyclomine (BENTYL) 10 MG capsule   Other Relevant Orders   CBC with Differential   COMPLETE METABOLIC PANEL WITH GFR   Shortness of breath    Likely secondary to  anxiety.  Will have CXR ordered today.  Plan: 1. CXR ordered today      Relevant Orders   DG Chest 2 View   Fatigue    Likely secondary to recent plastic surgery that took place approx 4 weeks ago in combination with anxiety. Will have labs drawn today for evaluation.      Relevant Orders   TSH + free T4   B12   VITAMIN D 25 Hydroxy (Vit-D Deficiency, Fractures)   Heavy Metals Panel, Blood   Anxiety      Increased anxiety s/p plastic surgery.  Discussed starting low dose SSRI along with recommendations for counseling/therapy.  Will send in low dose escitalopram 5mg to take 1 tablet daily.  To review mood handout.  To return to clinic in 2 weeks for re-evaluation      Relevant Medications   escitalopram (LEXAPRO) 5 MG tablet      Meds ordered this encounter  Medications  . dicyclomine (BENTYL) 10 MG capsule    Sig: Take 1 capsule (10 mg total) by mouth 4 (four) times daily -  before meals and at bedtime for 10 days.    Dispense:  40 capsule    Refill:  0  . escitalopram (LEXAPRO) 5 MG tablet    Sig: Take 1 tablet (5 mg total) by mouth daily.    Dispense:  30 tablet    Refill:  1    Follow up plan: No follow-ups on file.   Nicole Marie [name], FNP Family Nurse Practitioner [facility] 08/23/2020, 12:49 PM 

## 2020-08-23 NOTE — Assessment & Plan Note (Signed)
Likely secondary to anxiety.  Will have CXR ordered today.  Plan: 1. CXR ordered today

## 2020-08-23 NOTE — Assessment & Plan Note (Signed)
Long history of lactose intolerance and since having gastric surgery has dabbled in dairy.  Recently she had ice cream approx 4 days ago and has been having abdominal cramping and diarrhea.  Encouraged follow up with her gastroenterologist and will send in rx for bentyl to help with reduction of abdominal cramping.  Plan: 1. Begin bentyl TID PRN for abdominal cramping 2. Contact gastroenterology for follow up visit

## 2020-08-23 NOTE — Patient Instructions (Signed)
Have your labs and chest xray completed today.  We will contact you when we receive the results  I have sent in a prescription for bentyl to take 1 tablet up to 3x per day for abdominal cramping.  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  I have sent in a prescription for escitalopram 5mg  to take 1 tablet daily for anxiety.  As we discussed, you may have an increase in anxiety for the first few days.  We can plan to follow up in 2 weeks to re-evaluate how you are doing on this medication.  Please contact one of these locations below to schedule an appointment for psychiatric care  [facility] [address] [place], [place] Phone# [phone]  [facility] [address] [place], [place] Phone# [phone]  [facility] [address] [place], [place] Phone# [phone]  [facility] [address] [place], [place] Phone# [phone]  The following recommendations are helpful adjuncts for helping rebalance your mood.  Eat a nourishing diet. Ensure adequate intake of calories, protein, carbs, fat, vitamins, and minerals. Prioritize whole foods at each meal, including meats, vegetables, fruits, nuts and seeds, etc.   Avoid inflammatory and/or "junk" foods, such as sugar, omega-6 fats, refined grains, chemicals, and preservatives are common in packaged and prepared foods. Minimize or completely avoid these ingredients and stick to whole foods with little to no additives. Cook from scratch as much as possible for more control over what you eat  Get enough sleep. Poor sleep is significantly associated with depression and anxiety. Make 7-9 hours of sleep nightly a top priority  Exercise appropriately. Exercise is known to improve brain functioning and boost mood. Aim for 30 minutes of daily physical activity. Avoid "overtraining," which  can cause mental disturbances  Assess your light exposure. Not enough natural light during the day and too much artificial light can have a major impact on your mood. Get outside as often as possible during daylight hours. Minimize light exposure after dark and avoid the use of electronics that give off blue light before bed  Manage your stress.  Use daily stress management techniques such as meditation, yoga, or mindfulness to retrain your brain to respond differently to stress. Try deep breathing to deactivate your "fight or flight" response.  There are many of sources with apps like Headspace, Calm or a variety of YouTube videos (videos from [phone] have guided meditation)  Prioritize your social life. Work on building social support with new friends or improve current relationships. Consider getting a pet that allows for companionship, social interaction, and physical touch. Try volunteering or joining a faith-based community to increase your sense of purpose  4-7-8 breathing technique at bedtime: breathe in to count of 4, hold breath for count of 7, exhale for count of 8; do 3-5 times for letting go of overactive thoughts  Take time to play Unstructured "play" time can help reduce anxiety and depression Options for play include music, games, sports, dance, art, etc.  Try to add daily omega 3 fatty acids, magnesium, B complex, and balanced amino acid supplements to help improve mood and anxiety.  We will plan to see you back in 2 weeks for anxiety and follow up visit  You will receive a survey after today's visit either digitally by e-mail or paper by USPS mail. Your experiences and feedback matter to [facility].  Please respond so we  know how we are doing as we provide care for you.  Call us with any questions/concerns/needs.  It is my goal to be available to you for your health concerns.  Thanks for choosing me to be a partner in your healthcare needs!  Charlaine Dalton, FNP-C Family  Nurse Practitioner [facility] Phone: [phone]

## 2020-08-23 NOTE — Assessment & Plan Note (Signed)
Likely secondary to recent plastic surgery that took place approx 4 weeks ago in combination with anxiety. Will have labs drawn today for evaluation.

## 2020-08-24 ENCOUNTER — Encounter: Payer: Self-pay | Admitting: Family Medicine

## 2020-08-24 DIAGNOSIS — E559 Vitamin D deficiency, unspecified: Secondary | ICD-10-CM

## 2020-08-24 MED ORDER — VITAMIN D (ERGOCALCIFEROL) 1.25 MG (50000 UNIT) PO CAPS: 50000.0000 [IU] | ORAL_CAPSULE | ORAL | 0 refills | Status: DC

## 2020-08-25 LAB — CBC WITH DIFFERENTIAL/PLATELET
Absolute Monocytes: 440 cells/uL (ref 200–950)
Basophils Absolute: 60 cells/uL (ref 0–200)
Basophils Relative: 1.2 %
Eosinophils Absolute: 80 cells/uL (ref 15–500)
Eosinophils Relative: 1.6 %
HCT: 38.6 % (ref 35.0–45.0)
Hemoglobin: 12.6 g/dL (ref 11.7–15.5)
Lymphs Abs: 3010 cells/uL (ref 850–3900)
MCH: 29.4 pg (ref 27.0–33.0)
MCHC: 32.6 g/dL (ref 32.0–36.0)
MCV: 90.2 fL (ref 80.0–100.0)
MPV: 11 fL (ref 7.5–12.5)
Monocytes Relative: 8.8 %
Neutro Abs: 1410 cells/uL — ABNORMAL LOW (ref 1500–7800)
Neutrophils Relative %: 28.2 %
Platelets: 349 10*3/uL (ref 140–400)
RBC: 4.28 10*6/uL (ref 3.80–5.10)
RDW: 12.5 % (ref 11.0–15.0)
Total Lymphocyte: 60.2 %
WBC: 5 10*3/uL (ref 3.8–10.8)

## 2020-08-25 LAB — TSH+FREE T4: TSH W/REFLEX TO FT4: 0.48 mIU/L

## 2020-08-25 LAB — COMPLETE METABOLIC PANEL WITH GFR
AG Ratio: 1.5 (calc) (ref 1.0–2.5)
ALT: 15 U/L (ref 6–29)
AST: 18 U/L (ref 10–30)
Albumin: 4.1 g/dL (ref 3.6–5.1)
Alkaline phosphatase (APISO): 48 U/L (ref 31–125)
BUN: 10 mg/dL (ref 7–25)
CO2: 25 mmol/L (ref 20–32)
Calcium: 9.7 mg/dL (ref 8.6–10.2)
Chloride: 105 mmol/L (ref 98–110)
Creat: 0.65 mg/dL (ref 0.50–1.10)
GFR, Est African American: 136 mL/min/{1.73_m2} (ref >=60)
GFR, Est Non African American: 117 mL/min/{1.73_m2} (ref >=60)
Globulin: 2.7 g/dL (calc) (ref 1.9–3.7)
Glucose, Bld: 75 mg/dL (ref 65–99)
Potassium: 3.8 mmol/L (ref 3.5–5.3)
Sodium: 138 mmol/L (ref 135–146)
Total Bilirubin: 0.3 mg/dL (ref 0.2–1.2)
Total Protein: 6.8 g/dL (ref 6.1–8.1)

## 2020-08-25 LAB — VITAMIN D 25 HYDROXY (VIT D DEFICIENCY, FRACTURES): Vit D, 25-Hydroxy: 15 ng/mL — ABNORMAL LOW (ref 30–100)

## 2020-08-25 LAB — HEAVY METALS PANEL, BLOOD
Arsenic: <10 mcg/L (ref <23)
Lead: <1 ug/dL (ref <5)
Mercury, B: <5 mcg/L (ref 0–10)

## 2020-08-25 LAB — VITAMIN B12: Vitamin B-12: 238 pg/mL (ref 200–1100)

## 2020-09-08 ENCOUNTER — Encounter: Payer: Self-pay | Admitting: Family Medicine

## 2020-09-08 DIAGNOSIS — E559 Vitamin D deficiency, unspecified: Secondary | ICD-10-CM | POA: Insufficient documentation

## 2020-09-14 ENCOUNTER — Other Ambulatory Visit: Payer: Self-pay | Admitting: Family Medicine

## 2020-09-14 DIAGNOSIS — F419 Anxiety disorder, unspecified: Secondary | ICD-10-CM

## 2020-09-19 ENCOUNTER — Other Ambulatory Visit (HOSPITAL_COMMUNITY)
Admission: RE | Admit: 2020-09-19 | Discharge: 2020-09-19 | Disposition: A | Discharge status: Home / Self Care | Payer: Medicaid Other | Source: Ambulatory Visit | Attending: Obstetrics and Gynecology | Admitting: Obstetrics and Gynecology

## 2020-09-19 ENCOUNTER — Other Ambulatory Visit: Payer: Self-pay

## 2020-09-19 ENCOUNTER — Encounter: Payer: Self-pay | Admitting: Obstetrics and Gynecology

## 2020-09-19 ENCOUNTER — Ambulatory Visit (INDEPENDENT_AMBULATORY_CARE_PROVIDER_SITE_OTHER): Payer: Medicaid Other | Admitting: Obstetrics and Gynecology

## 2020-09-19 VITALS — BP 110/70 | Ht 66.0 in | Wt 179.0 lb

## 2020-09-19 DIAGNOSIS — R3915 Urgency of urination: Secondary | ICD-10-CM | POA: Diagnosis not present

## 2020-09-19 DIAGNOSIS — Z113 Encounter for screening for infections with a predominantly sexual mode of transmission: Secondary | ICD-10-CM | POA: Insufficient documentation

## 2020-09-19 DIAGNOSIS — N898 Other specified noninflammatory disorders of vagina: Secondary | ICD-10-CM | POA: Diagnosis not present

## 2020-09-19 DIAGNOSIS — N930 Postcoital and contact bleeding: Secondary | ICD-10-CM

## 2020-09-19 LAB — POCT URINALYSIS DIPSTICK
Bilirubin, UA: NEGATIVE
Blood, UA: NEGATIVE
Glucose, UA: NEGATIVE
Ketones, UA: NEGATIVE
Leukocytes, UA: NEGATIVE
Nitrite, UA: NEGATIVE
Protein, UA: NEGATIVE
Spec Grav, UA: 1.025 (ref 1.010–1.025)
pH, UA: 5 (ref 5.0–8.0)

## 2020-09-19 LAB — POCT WET PREP WITH KOH
Clue Cells Wet Prep HPF POC: NEGATIVE
KOH Prep POC: NEGATIVE
Trichomonas, UA: NEGATIVE
Yeast Wet Prep HPF POC: NEGATIVE

## 2020-09-19 MED ORDER — FLUCONAZOLE 150 MG PO TABS: 150.0000 mg | ORAL_TABLET | Freq: Once | ORAL | 0 refills | Status: AC

## 2020-09-19 NOTE — Progress Notes (Signed)
[name], Jodelle Gross, FNP   Chief Complaint  Patient presents with  . Vaginal Bleeding    happened the day after intercourse, no abnormal pain, one time only  . Urinary Tract Infection    urgency urinating, no burning x few days    HPI:      [name] is a 34 y.o. [id number] whose LMP was No LMP recorded. Patient has had a hysterectomy., presents today for small amount of bright red vaginal bleeding with small clots after sex x 1 episode last wk. No pain; sx resolved. Pt is s/p hyst. Also has vaginal itch and odor, no increased d/c. No recent abx use, no change in soaps.  Hx of BV in past. She is sex active with new partner. Would like STD testing. Neg STD testing 7/21 and 8/21.  Has also had urinary urgency and frequency since yesterday but sx improved today. Drinks caffeine daily but no extra amount yesterday.    Past Medical History:  Diagnosis Date  . Abnormal Pap smear of cervix 05/01/2016  . Anemia   . Anxiety   . Complication of anesthesia    ITCHING  . GERD (gastroesophageal reflux disease)    NO MEDS  . Hyperthyroidism     Past Surgical History:  Procedure Laterality Date  . BARIATRIC SURGERY    . CESAREAN SECTION     X 4  . CHOLECYSTECTOMY    . COLPOSCOPY  06/18/2016  . CYSTOSCOPY N/A 04/15/2017   Procedure: CYSTOSCOPY;  Surgeon: Nadara Mustard, MD;  Location: [facility] ORS;  Service: Gynecology;  Laterality: N/A;  . LAPAROSCOPIC HYSTERECTOMY N/A 04/15/2017   Ovaries remain - Procedure: HYSTERECTOMY TOTAL LAPAROSCOPIC;  Surgeon: Nadara Mustard, MD;  Location: [facility] ORS;  Service: Gynecology;  Laterality: N/A;  . TONSILLECTOMY    . TUBAL LIGATION    . TUBAL LIGATION    . UPPER GASTROINTESTINAL ENDOSCOPY      Family History  Problem Relation Age of Onset  . Ataxia Mother        spinal cerebellar ataxia  . Stroke Father        ischemic  . Ataxia Sister   . Ataxia Brother   . Autism Son   . Autism Son   . Healthy Son   . Developmental delay Son       Social History   Socioeconomic History  . Marital status: Single    Spouse name: Not on file  . Number of children: Not on file  . Years of education: Not on file  . Highest education level: Not on file  Occupational History  . Not on file  Tobacco Use  . Smoking status: Never Smoker  . Smokeless tobacco: Never Used  Vaping Use  . Vaping Use: Never used  Substance and Sexual Activity  . Alcohol use: No  . Drug use: No  . Sexual activity: Yes    Birth control/protection: Surgical    Comment: Hysterectomy  Other Topics Concern  . Not on file  Social History Narrative  . Not on file   Social Determinants of Health   Financial Resource Strain:   . Difficulty of Paying Living Expenses: Not on file  Food Insecurity:   . Worried About Programme researcher, broadcasting/film/video in the Last Year: Not on file  . Ran Out of Food in the Last Year: Not on file  Transportation Needs:   . Lack of Transportation (Medical): Not on file  . Lack of Transportation (  Non-Medical): Not on file  Physical Activity:   . Days of Exercise per Week: Not on file  . Minutes of Exercise per Session: Not on file  Stress:   . Feeling of Stress : Not on file  Social Connections:   . Frequency of Communication with Friends and Family: Not on file  . Frequency of Social Gatherings with Friends and Family: Not on file  . Attends Religious Services: Not on file  . Active Member of Clubs or Organizations: Not on file  . Attends Banker Meetings: Not on file  . Marital Status: Not on file  Intimate Partner Violence:   . Fear of Current or Ex-Partner: Not on file  . Emotionally Abused: Not on file  . Physically Abused: Not on file  . Sexually Abused: Not on file    Outpatient Medications Prior to Visit  Medication Sig Dispense Refill  . escitalopram (LEXAPRO) 5 MG tablet TAKE 1 TABLET BY MOUTH EVERY DAY 90 tablet 0  . estradiol (VIVELLE-DOT) 0.025 MG/24HR PLACE 1 PATCH ONTO THE SKIN 2 (TWO) TIMES A WEEK.  24 patch 5  . omeprazole (PRILOSEC) 20 MG capsule Take 20 mg by mouth daily.    . Probiotic Product (PROBIOTIC PO) Take 1 tablet by mouth daily.    . promethazine (PHENERGAN) 25 MG tablet Take 25 mg by mouth every 6 (six) hours as needed.    . Vitamin D, Ergocalciferol, (DRISDOL) 1.25 MG (50000 UNIT) CAPS capsule Take 1 capsule (50,000 Units total) by mouth every 7 (seven) days. 12 capsule 0  . cyanocobalamin (,VITAMIN B-12,) 1000 MCG/ML injection INJECT 1 ML (1,000 MCG TOTAL) INTO THE MUSCLE ONCE FOR 1 DOSE. REPEAT MONTHLY. (Patient not taking: Reported on 08/23/2020) 1 mL 11  . ciclopirox (PENLAC) 8 % solution APPLY TOPICALLY NIGHTLY APPLY OVER NAIL AND SURROUNDING SKIN. (Patient not taking: Reported on 08/23/2020)    . dicyclomine (BENTYL) 10 MG capsule Take 1 capsule (10 mg total) by mouth 4 (four) times daily -  before meals and at bedtime for 10 days. 40 capsule 0  . nystatin (MYCOSTATIN/NYSTOP) powder Apply 1 application topically 3 (three) times daily. (Patient not taking: Reported on 08/23/2020) 15 g 0  . senna (SENOKOT) 8.6 MG TABS tablet Take 1 tablet by mouth 2 (two) times daily. (Patient not taking: Reported on 08/23/2020)    . sucralfate (CARAFATE) 1 g tablet Take by mouth.     No facility-administered medications prior to visit.      ROS:  Review of Systems  Constitutional: Negative for fever.  Gastrointestinal: Negative for blood in stool, constipation, diarrhea, nausea and vomiting.  Genitourinary: Positive for frequency, urgency and vaginal bleeding. Negative for dyspareunia, dysuria, flank pain, hematuria, vaginal discharge and vaginal pain.  Musculoskeletal: Negative for back pain.  Skin: Negative for rash.     OBJECTIVE:   Vitals:  BP 110/70   Ht 5\' 6"  (1.676 m)   Wt 179 lb (81.2 kg)   BMI 28.89 kg/m   Physical Exam Vitals reviewed.  Constitutional:      Appearance: She is well-developed.  Pulmonary:     Effort: Pulmonary effort is normal.  Genitourinary:     General: Normal vulva.     Pubic Area: No rash.      Labia:        Right: No rash, tenderness or lesion.        Left: No rash, tenderness or lesion.      Vagina: Vaginal discharge present. No erythema,  tenderness or bleeding.     Uterus: Absent. Not tender.      Adnexa: Right adnexa normal and left adnexa normal.       Right: No mass or tenderness.         Left: No mass or tenderness.       Comments: NO EVID OF BLEEDING/TEAR/LESION Musculoskeletal:        General: Normal range of motion.     Cervical back: Normal range of motion.  Skin:    General: Skin is warm and dry.  Neurological:     General: No focal deficit present.     Mental Status: She is alert and oriented to person, place, and time.  Psychiatric:        Mood and Affect: Mood normal.        Behavior: Behavior normal.        Thought Content: Thought content normal.        Judgment: Judgment normal.     Results: Results for orders placed or performed in visit on 09/19/20 (from the past 24 hour(s))  POCT Wet Prep with KOH     Status: Normal   Collection Time: 09/19/20  4:44 PM  Result Value Ref Range   Trichomonas, UA Negative    Clue Cells Wet Prep HPF POC neg    Epithelial Wet Prep HPF POC     Yeast Wet Prep HPF POC neg    Bacteria Wet Prep HPF POC     RBC Wet Prep HPF POC     WBC Wet Prep HPF POC     KOH Prep POC Negative Negative  POCT Urinalysis Dipstick     Status: Normal   Collection Time: 09/19/20  4:44 PM  Result Value Ref Range   Color, UA yellow    Clarity, UA clear    Glucose, UA Negative Negative   Bilirubin, UA neg    Ketones, UA neg    Spec Grav, UA 1.025 1.010 - 1.025   Blood, UA neg    pH, UA 5.0 5.0 - 8.0   Protein, UA Negative Negative   Urobilinogen, UA     Nitrite, UA neg    Leukocytes, UA Negative Negative   Appearance     Odor       Assessment/Plan: Urinary urgency - Plan: POCT Urinalysis Dipstick, Urine Culture; Neg UA, check C&S. Sx improved today. Will f/u if pos. D/C  caffeine.   Vaginal itching - Plan: fluconazole (DIFLUCAN) 150 MG tablet, POCT Wet Prep with KOH; neg wet prep but pos sx. Treat empirically for yeast vag with diflucan. Rx eRxd.  F/u if sx persist.  Postcoital bleeding--neg exam. Rule out STD. Could be slight tear but no evidence today. Add lubricants.   Screening for STD (sexually transmitted disease) - Plan: Cervicovaginal ancillary only    Meds ordered this encounter  Medications  . fluconazole (DIFLUCAN) 150 MG tablet    Sig: Take 1 tablet (150 mg total) by mouth once for 1 dose.    Dispense:  1 tablet    Refill:  0    Order Specific Question:   Supervising Provider    Answer:   Nadara Mustard [[id number]]      Return if symptoms worsen or fail to improve.  Katrinia Straker B. Payten Hobin, PA-C 09/19/2020 4:47 PM

## 2020-09-19 NOTE — Patient Instructions (Signed)
I value your feedback and entrusting us with your care. If you get a [facility] patient survey, I would appreciate you taking the time to let us know about your experience today. Thank you!  As of September 30, 2019, your lab results will be released to your MyChart immediately, before I even have a chance to see them. Please give me time to review them and contact you if there are any abnormalities. Thank you for your patience.  

## 2020-09-21 ENCOUNTER — Encounter: Payer: Self-pay | Admitting: Obstetrics and Gynecology

## 2020-09-21 LAB — CERVICOVAGINAL ANCILLARY ONLY
Chlamydia: NEGATIVE
Comment: NEGATIVE
Comment: NORMAL
Neisseria Gonorrhea: NEGATIVE

## 2020-09-21 LAB — URINE CULTURE

## 2020-09-21 NOTE — Telephone Encounter (Signed)
Everything was normal right?

## 2020-11-09 ENCOUNTER — Other Ambulatory Visit: Payer: Self-pay | Admitting: Family Medicine

## 2020-11-09 DIAGNOSIS — E559 Vitamin D deficiency, unspecified: Secondary | ICD-10-CM

## 2020-11-09 NOTE — Telephone Encounter (Signed)
Requested medications are due for refill today yes  Requested medications are on the active medication list yes  Last refill 11/4  Last visit 08/2020  Future visit scheduled no  Notes to clinic Not Delegated, last OV 08/2020 states repeat lab in 12 weeks.

## 2020-11-27 IMAGING — DX DG FOOT 2V*R*
2 series · 2 of 2 positions shown · non-contrast
Comparison: None.

CLINICAL DATA: Pt fell in [REDACTED] today. Pt has right knee and
right lower leg pain. Pt has icepack to knee. Pt alert. no hx of the
same

EXAM:
RIGHT FOOT - 2 VIEW

[foot ap]
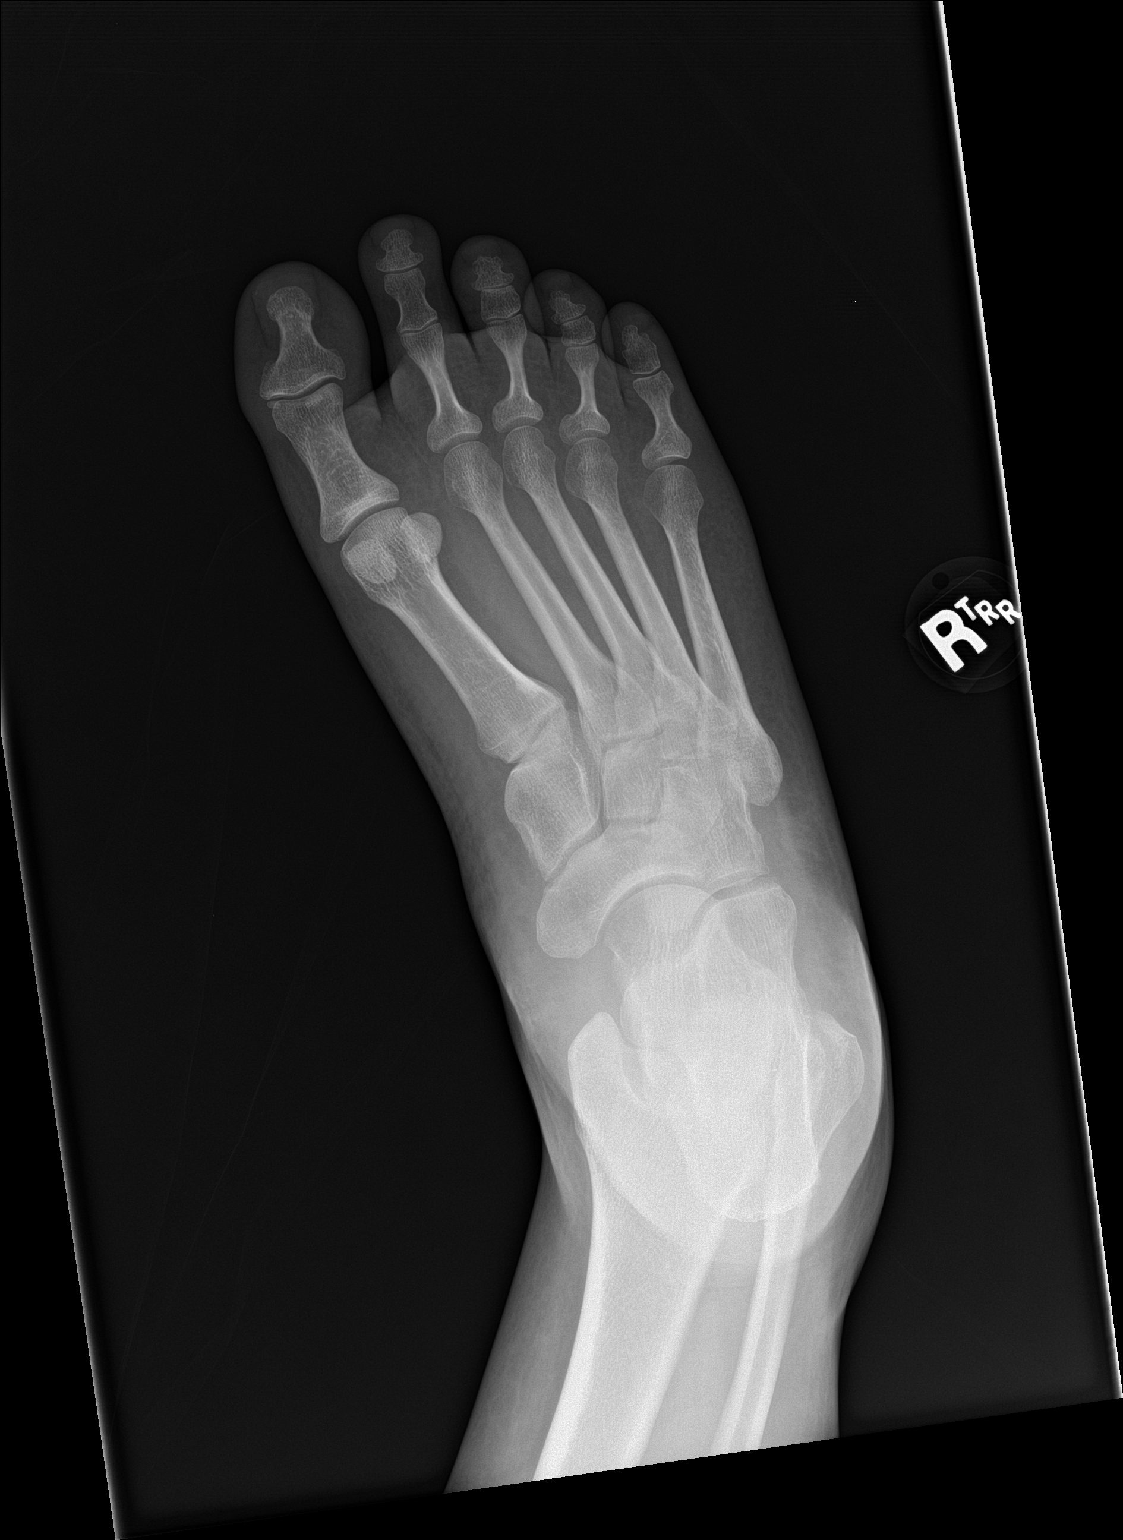

[foot lat]
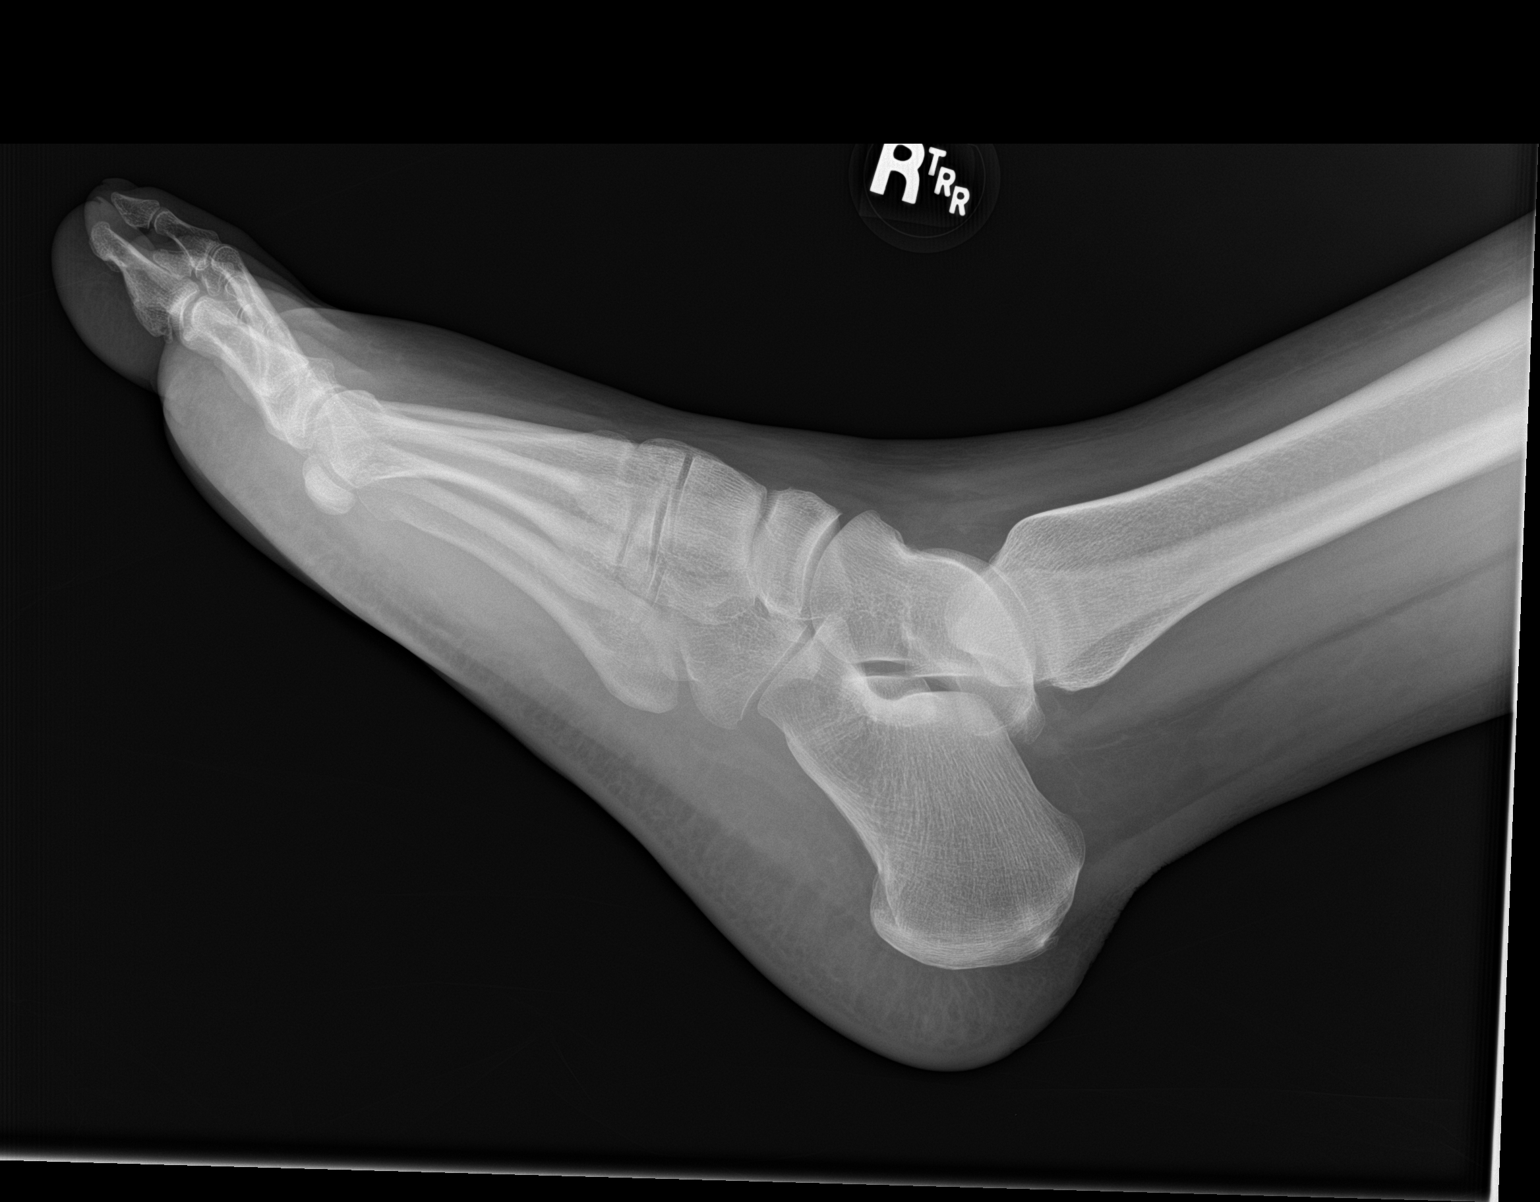

[2 of 2 positions shown; findings below may reference images not displayed]

FINDINGS: There is no evidence of fracture or dislocation. There is no
evidence of arthropathy or other focal bone abnormality. Soft
tissues are unremarkable.
IMPRESSION: Negative right foot radiographs.

## 2020-11-27 IMAGING — DX DG TIBIA/FIBULA 2V*R*
3 series · 3 of 3 positions shown · non-contrast
Comparison: None.

CLINICAL DATA: Pt fell in [REDACTED] today. Pt has right knee and
right lower leg pain. Pt has icepack to knee. Pt alert. no hx of the
same

EXAM:
RIGHT TIBIA AND FIBULA - 2 VIEW

[tibia ap (1 of 2)]
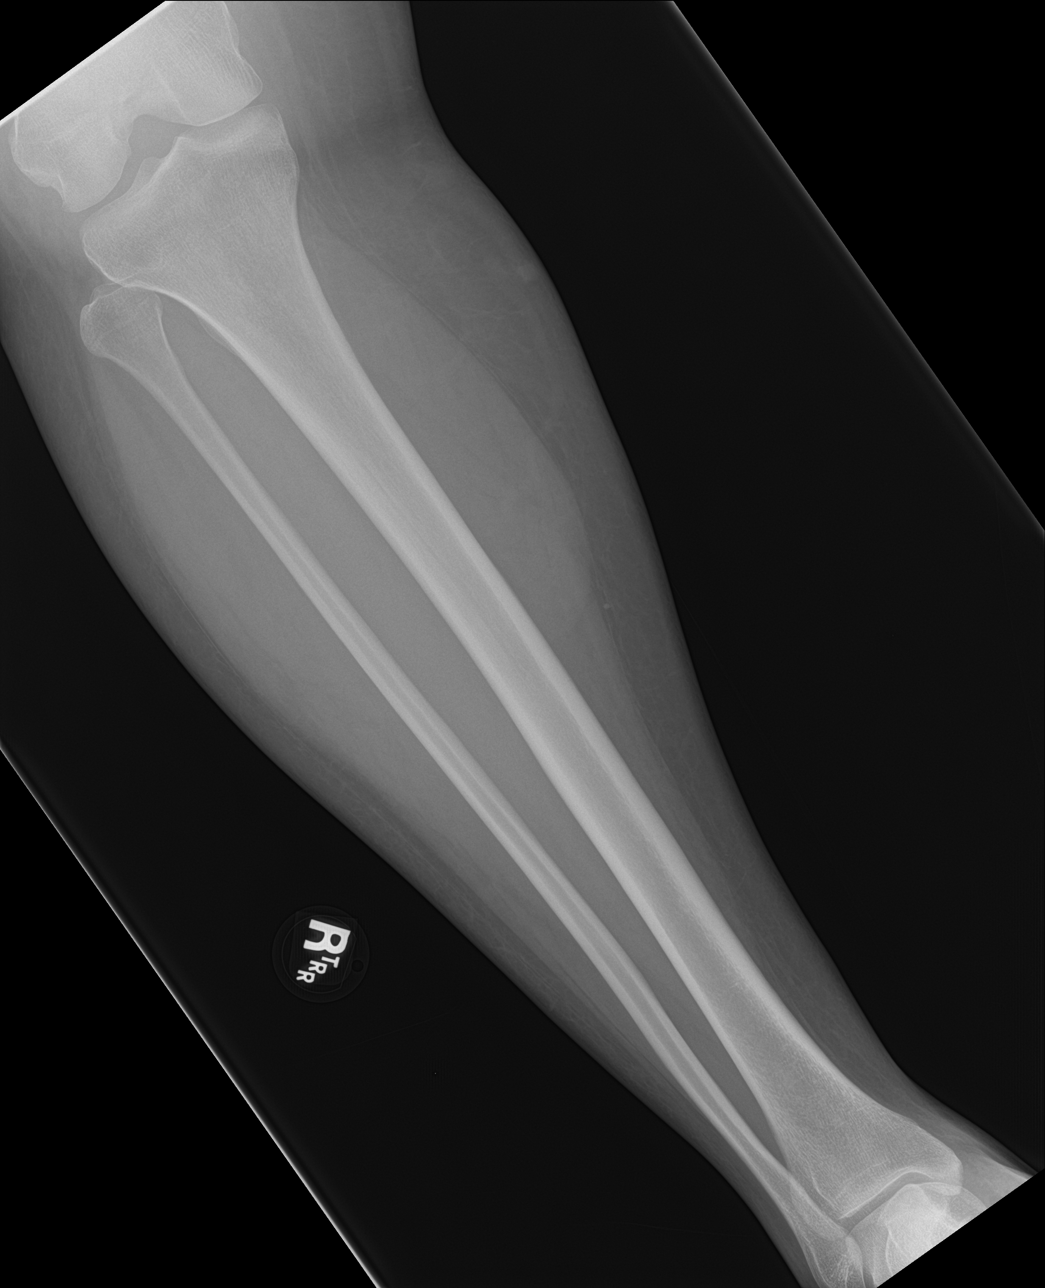

[tibia ap (2 of 2)]
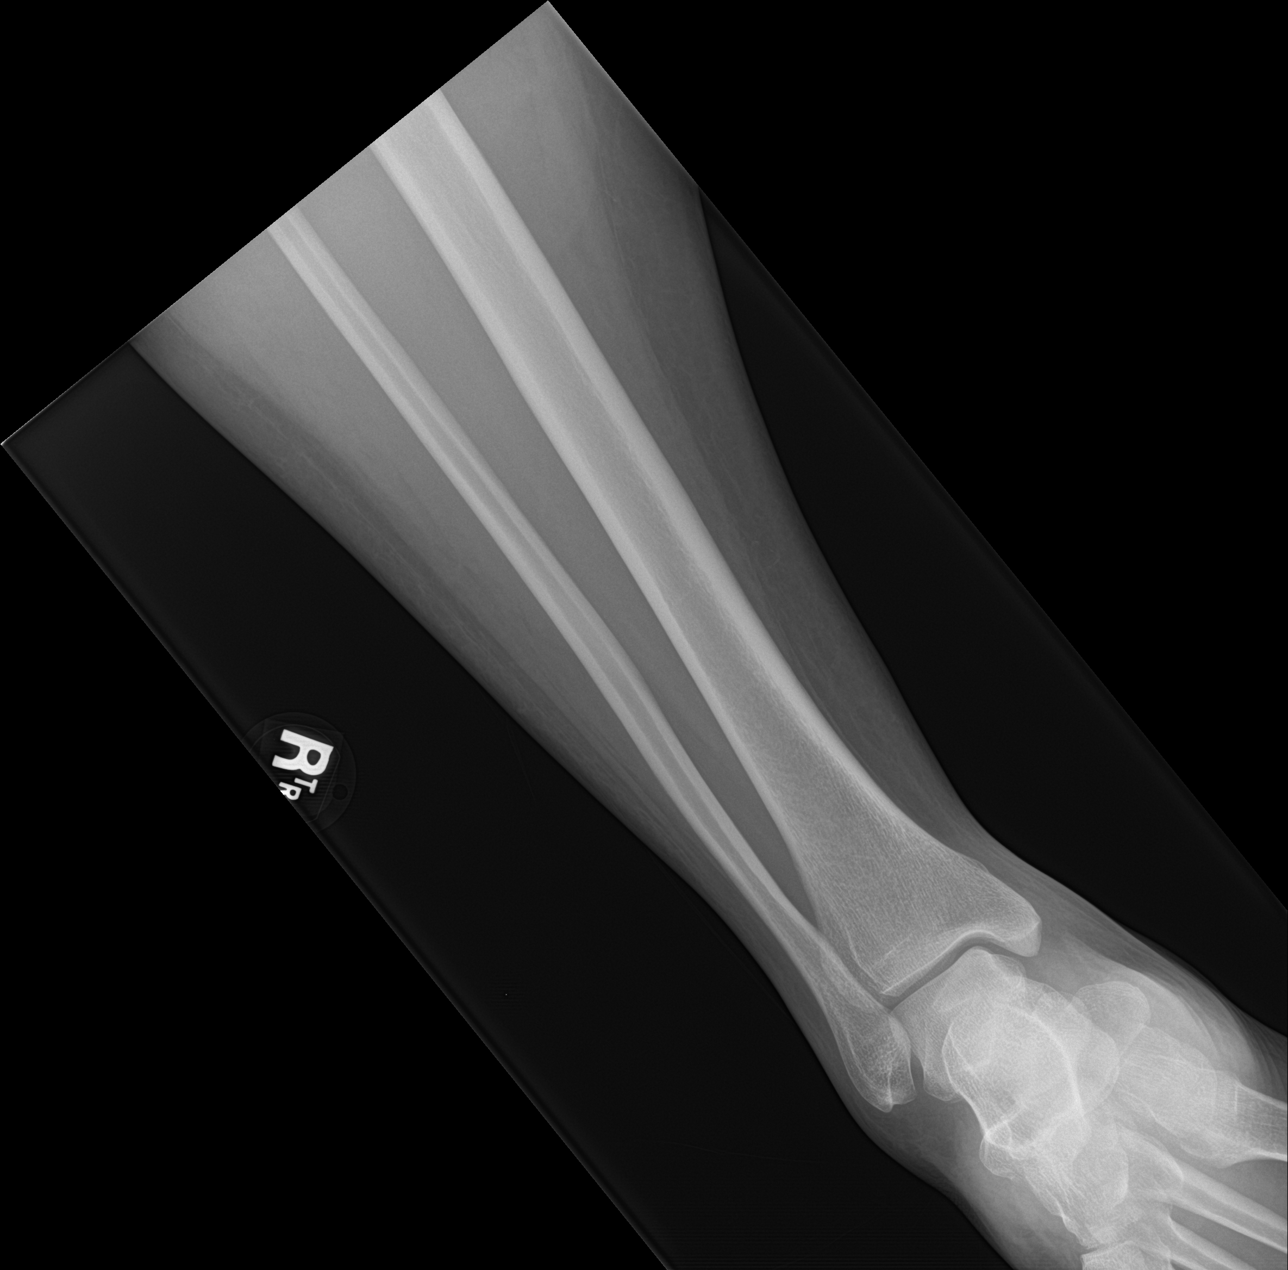

[tibia lat]
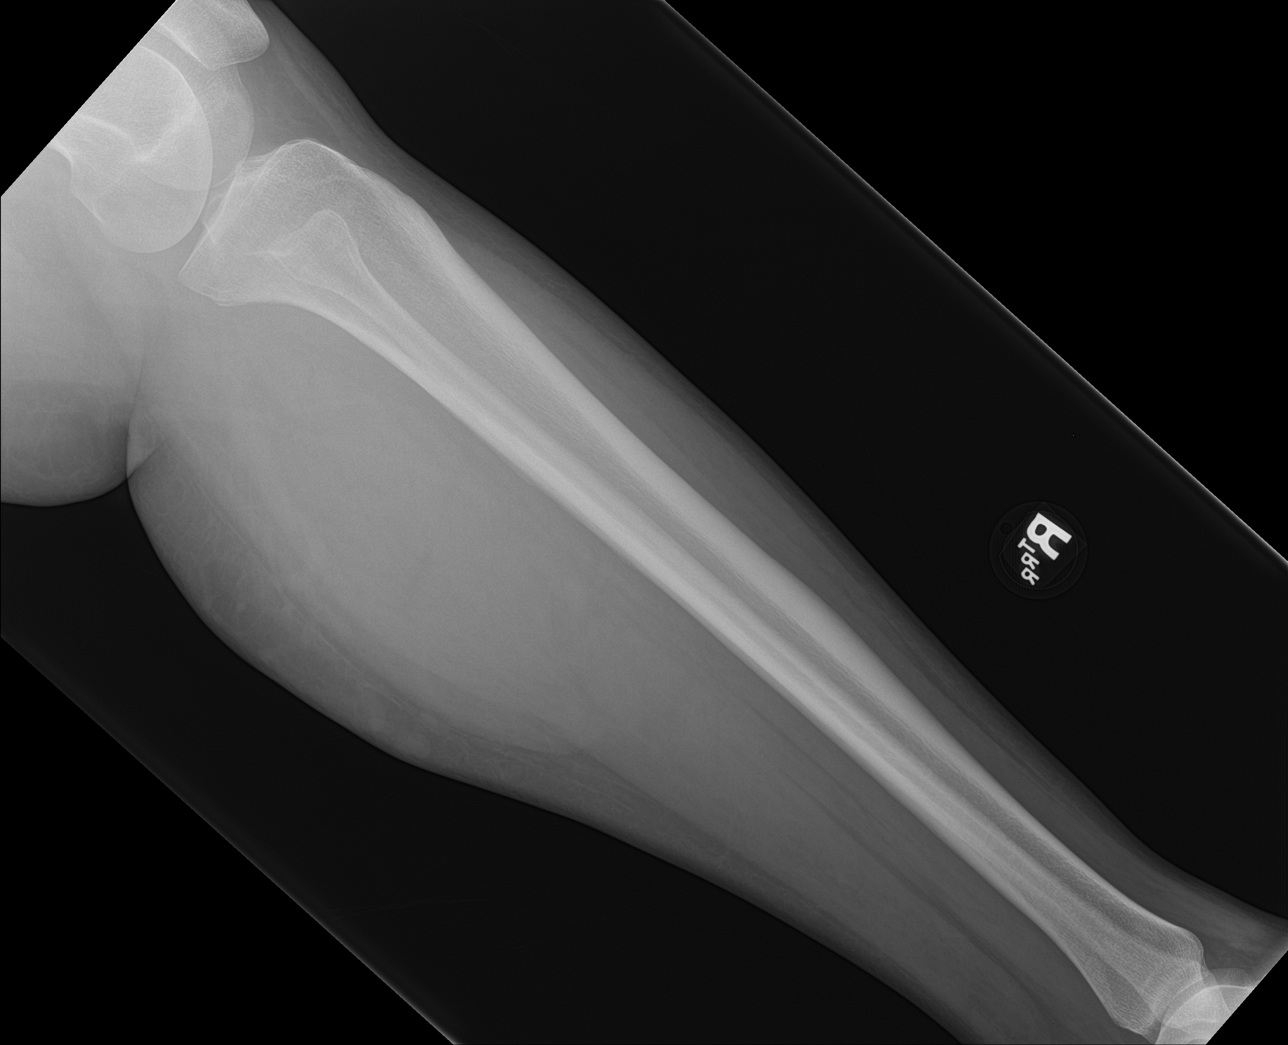

[3 of 3 positions shown; findings below may reference images not displayed]

FINDINGS: There is no evidence of fracture or other focal bone lesions.
Alignment maintained at the knee and ankle joints. Soft tissues are
unremarkable.
IMPRESSION: No acute osseous abnormality in the right tibia or fibula.

## 2020-11-27 IMAGING — DX DG KNEE COMPLETE 4+V*R*
4 series · 4 of 4 positions shown · non-contrast
Comparison: None.

CLINICAL DATA: Pt states she slipped in water at [REDACTED] and fell,
landing on right knee. Pain over patella and lateral knee. Abrasion
and swelling note to knee. Unable to bear wt.

EXAM:
RIGHT KNEE - COMPLETE 4+ VIEW

[knee ap]
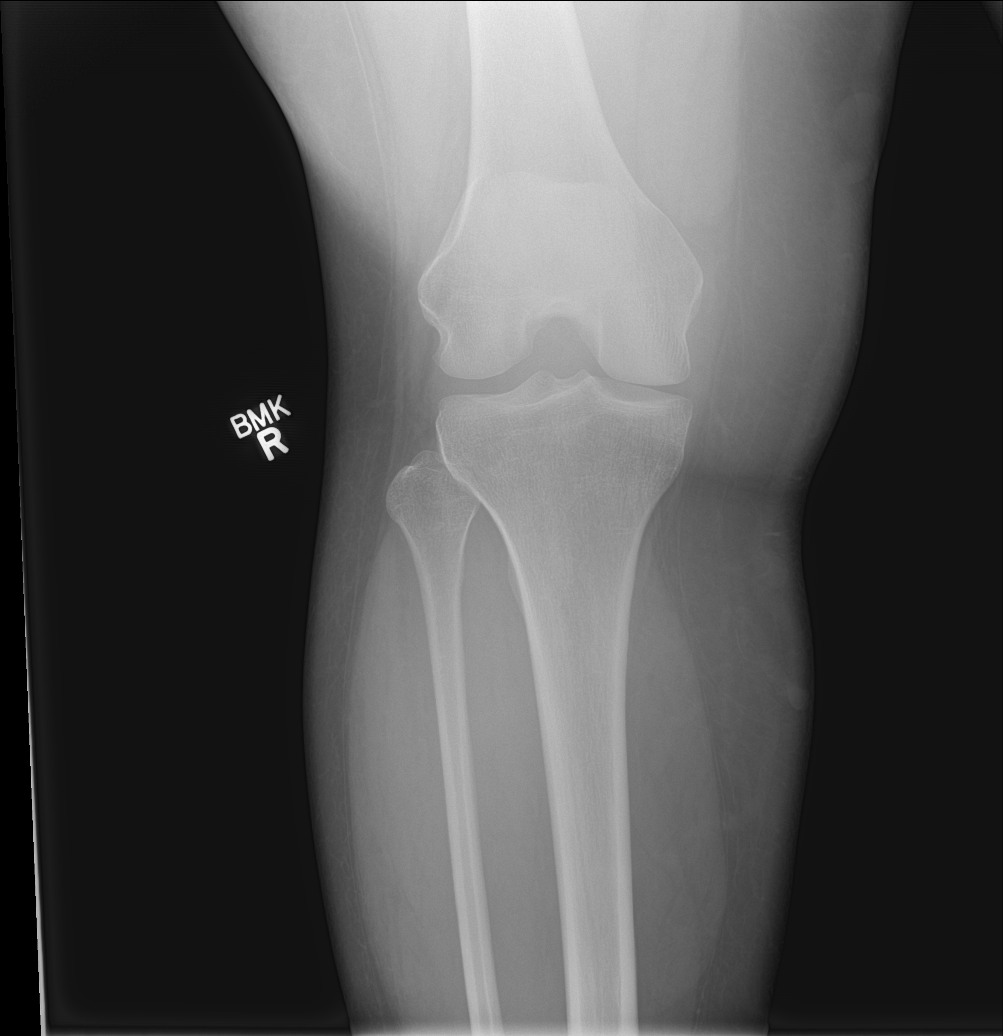

[knee tunnel]
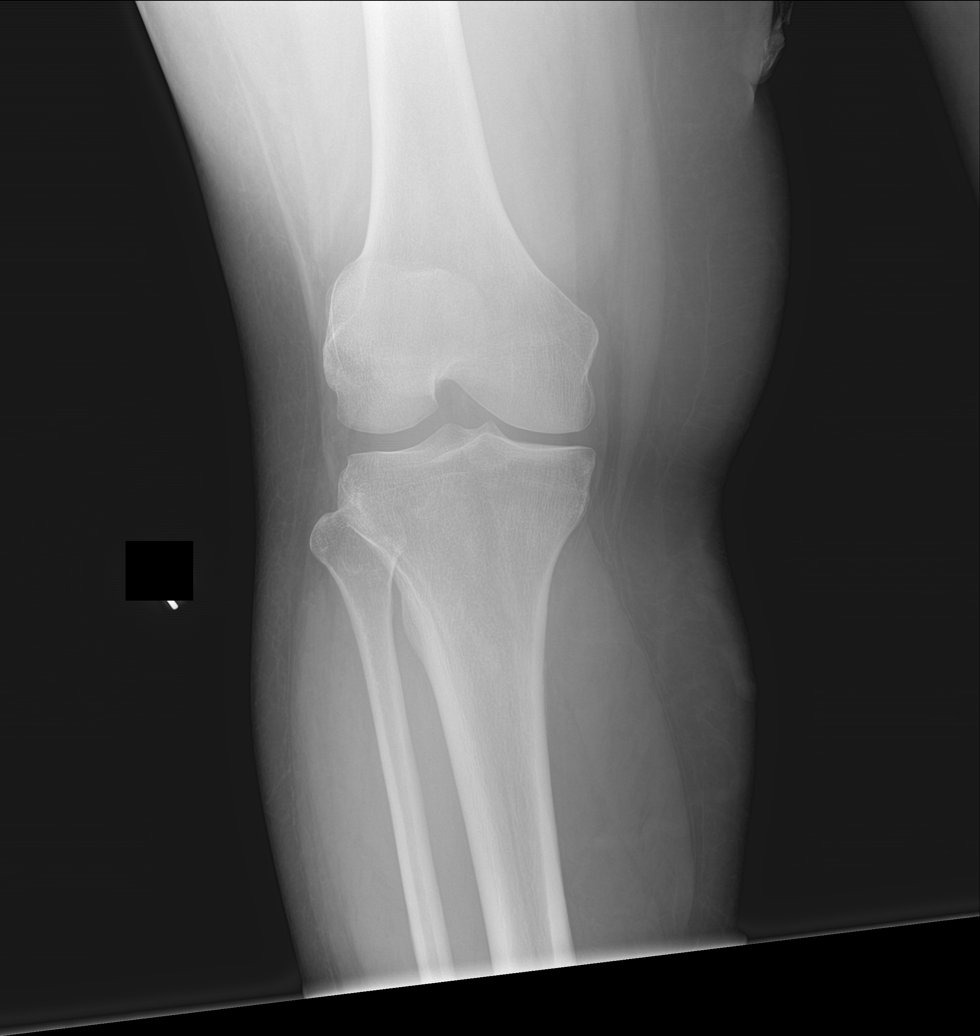

[knee lat]
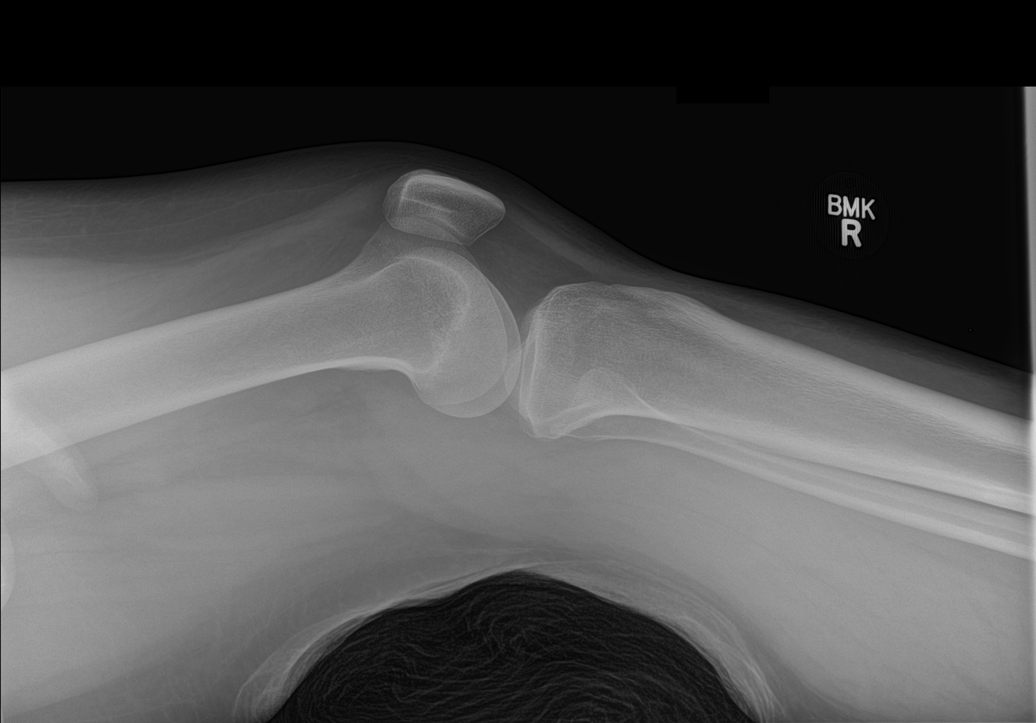

[knee obl]
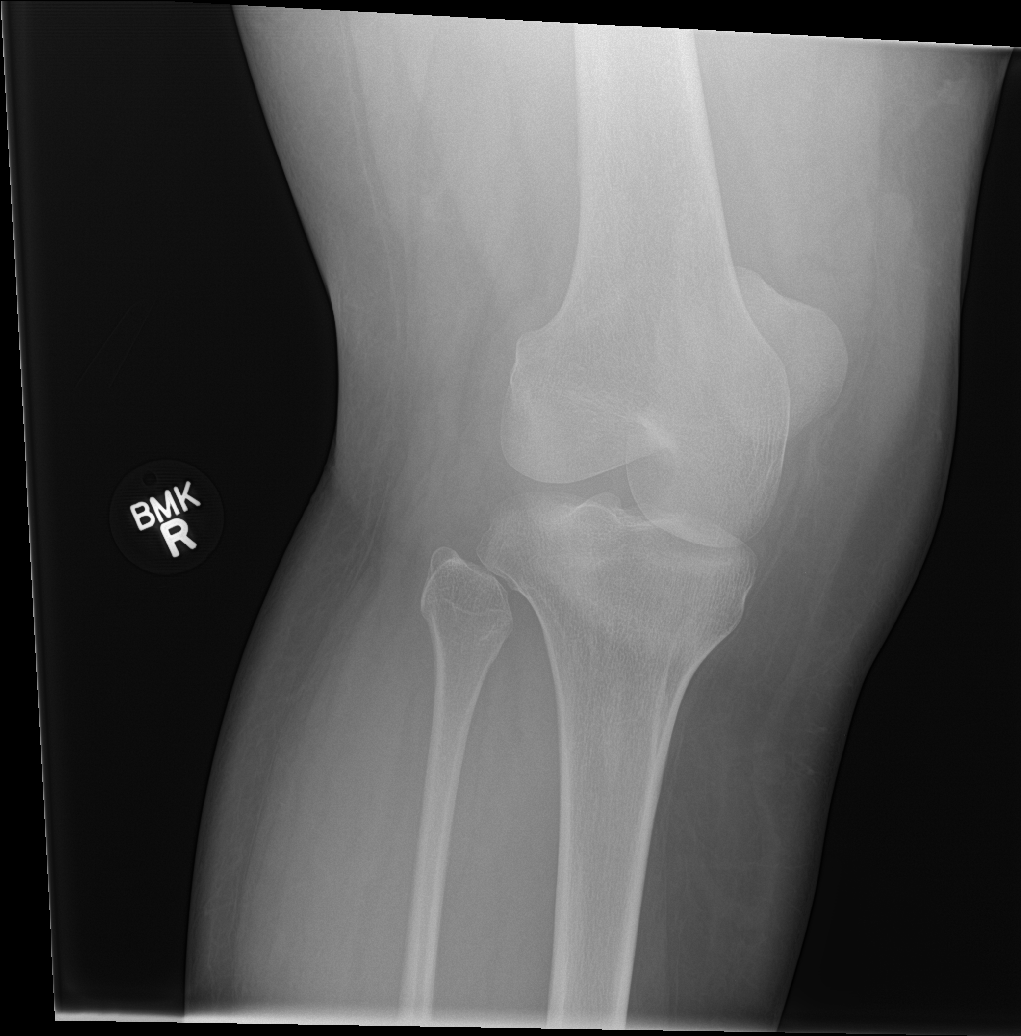

[4 of 4 positions shown; findings below may reference images not displayed]

FINDINGS: No evidence of fracture, dislocation, or joint effusion. No evidence
of arthropathy or other focal bone abnormality. Soft tissues are
unremarkable.
IMPRESSION: No acute osseous abnormality in the right knee.

## 2020-11-28 DIAGNOSIS — S80862A Insect bite (nonvenomous), left lower leg, initial encounter: Secondary | ICD-10-CM | POA: Diagnosis not present

## 2020-11-28 DIAGNOSIS — L089 Local infection of the skin and subcutaneous tissue, unspecified: Secondary | ICD-10-CM | POA: Diagnosis not present

## 2020-11-28 DIAGNOSIS — E119 Type 2 diabetes mellitus without complications: Secondary | ICD-10-CM | POA: Diagnosis not present

## 2020-11-28 DIAGNOSIS — B373 Candidiasis of vulva and vagina: Secondary | ICD-10-CM | POA: Diagnosis not present

## 2020-12-05 ENCOUNTER — Other Ambulatory Visit: Payer: Self-pay

## 2020-12-05 ENCOUNTER — Ambulatory Visit (INDEPENDENT_AMBULATORY_CARE_PROVIDER_SITE_OTHER): Payer: Medicaid Other | Admitting: Unknown Physician Specialty

## 2020-12-05 ENCOUNTER — Encounter: Payer: Self-pay | Admitting: Unknown Physician Specialty

## 2020-12-05 VITALS — BP 119/71 | HR 67 | Temp 97.5°F | Ht 66.0 in | Wt 187.0 lb

## 2020-12-05 DIAGNOSIS — R202 Paresthesia of skin: Secondary | ICD-10-CM | POA: Diagnosis not present

## 2020-12-05 DIAGNOSIS — R002 Palpitations: Secondary | ICD-10-CM

## 2020-12-05 DIAGNOSIS — F419 Anxiety disorder, unspecified: Secondary | ICD-10-CM

## 2020-12-05 NOTE — Progress Notes (Signed)
   BP 119/71   Pulse 67   Temp (!) 97.5 F (36.4 C) (Temporal)   Ht 5\' 6"  (1.676 m)   Wt 187 lb (84.8 kg)   SpO2 100%   BMI 30.18 kg/m    Subjective:    Patient ID: , female    DOB: Jul 03, 1986, 35 y.o.   MRN: [id number]  HPI: Crystal Mendez is a 35 y.o. female  Chief Complaint  Patient presents with  . Insect Bite    [facility] walk-in got it taken care of  . Palpitations  . Tingling    On hands and feet.  . Numbness    On Hands and Feet    Relevant past medical, surgical, family and social history reviewed and updated as indicated. Interim medical history since our last visit reviewed. Allergies and medications reviewed and updated.  Review of Systems  Per HPI unless specifically indicated above     Objective:    BP 119/71   Pulse 67   Temp (!) 97.5 F (36.4 C) (Temporal)   Ht 5\' 6"  (1.676 m)   Wt 187 lb (84.8 kg)   SpO2 100%   BMI 30.18 kg/m   Wt Readings from Last 3 Encounters:  12/05/20 187 lb (84.8 kg)  09/19/20 179 lb (81.2 kg)  08/23/20 178 lb 12.8 oz (81.1 kg)    Physical Exam  Results for orders placed or performed in visit on 09/19/20  Urine Culture   Specimen: Urine, Clean Catch   UC  Result Value Ref Range   Urine Culture, Routine Final report    Organism ID, Bacteria Comment   POCT Wet Prep with KOH  Result Value Ref Range   Trichomonas, UA Negative    Clue Cells Wet Prep HPF POC neg    Epithelial Wet Prep HPF POC     Yeast Wet Prep HPF POC neg    Bacteria Wet Prep HPF POC     RBC Wet Prep HPF POC     WBC Wet Prep HPF POC     KOH Prep POC Negative Negative  POCT Urinalysis Dipstick  Result Value Ref Range   Color, UA yellow    Clarity, UA clear    Glucose, UA Negative Negative   Bilirubin, UA neg    Ketones, UA neg    Spec Grav, UA 1.025 1.010 - 1.025   Blood, UA neg    pH, UA 5.0 5.0 - 8.0   Protein, UA Negative Negative   Urobilinogen, UA     Nitrite, UA neg    Leukocytes, UA Negative Negative    Appearance     Odor    Cervicovaginal ancillary only  Result Value Ref Range   Chlamydia Negative    Neisseria Gonorrhea Negative    Comment Normal Reference Ranger Chlamydia - Negative    Comment      Normal Reference Range Neisseria Gonorrhea - Negative      Assessment & Plan:   Problem List Items Addressed This Visit   None      Follow up plan: No follow-ups on file.

## 2020-12-05 NOTE — Assessment & Plan Note (Signed)
Patient no longer taking Lexapro. Will resume medication.

## 2020-12-05 NOTE — Progress Notes (Signed)
Established Patient Office Visit  Subjective:  Patient ID: Crystal Mendez, female    DOB: May 29, 1986  Age: 35 y.o. MRN: [id number]  CC:  Chief Complaint  Patient presents with  . Insect Bite    [facility] walk-in got it taken care of  . Palpitations  . Tingling    On hands and feet.  . Numbness    On Hands and Feet    HPI Crystal Mendez presents for tingling and numbness in hands and feet, and also palpitations. Patient also has concerns regarding white lesion on right shoulder area.   Numbness/Tingling Issue has been going on for over a year in both feet and hands.   Palpitations Patient states that she is having 2-3 episodes of palpitations and anxiety each week. Patient states most recent was when she was attempting to get in a nose ring. Patient began sweating, felt anxiety and palpitations only lasting a few seconds.     Past Medical History:  Diagnosis Date  . Abnormal Pap smear of cervix 05/01/2016  . Anemia   . Anxiety   . Complication of anesthesia    ITCHING  . GERD (gastroesophageal reflux disease)    NO MEDS  . Hyperthyroidism     Past Surgical History:  Procedure Laterality Date  . BARIATRIC SURGERY    . CESAREAN SECTION     X 4  . CHOLECYSTECTOMY    . COLPOSCOPY  06/18/2016  . CYSTOSCOPY N/A 04/15/2017   Procedure: CYSTOSCOPY;  Surgeon: Nadara Mustard, MD;  Location: [facility] ORS;  Service: Gynecology;  Laterality: N/A;  . LAPAROSCOPIC HYSTERECTOMY N/A 04/15/2017   Ovaries remain - Procedure: HYSTERECTOMY TOTAL LAPAROSCOPIC;  Surgeon: Nadara Mustard, MD;  Location: [facility] ORS;  Service: Gynecology;  Laterality: N/A;  . TONSILLECTOMY    . TUBAL LIGATION    . TUBAL LIGATION    . UPPER GASTROINTESTINAL ENDOSCOPY      Family History  Problem Relation Age of Onset  . Ataxia Mother        spinal cerebellar ataxia  . Stroke Father        ischemic  . Ataxia Sister   . Ataxia Brother   . Autism Son   . Autism Son   . Healthy Son   .  Developmental delay Son     Social History   Socioeconomic History  . Marital status: Single    Spouse name: Not on file  . Number of children: Not on file  . Years of education: Not on file  . Highest education level: Not on file  Occupational History  . Not on file  Tobacco Use  . Smoking status: Never Smoker  . Smokeless tobacco: Never Used  Vaping Use  . Vaping Use: Never used  Substance and Sexual Activity  . Alcohol use: No  . Drug use: No  . Sexual activity: Yes    Birth control/protection: Surgical    Comment: Hysterectomy  Other Topics Concern  . Not on file  Social History Narrative  . Not on file   Social Determinants of Health   Financial Resource Strain: Not on file  Food Insecurity: Not on file  Transportation Needs: Not on file  Physical Activity: Not on file  Stress: Not on file  Social Connections: Not on file  Intimate Partner Violence: Not on file    Outpatient Medications Prior to Visit  Medication Sig Dispense Refill  . estradiol (VIVELLE-DOT) 0.025 MG/24HR PLACE 1 PATCH ONTO THE SKIN 2 (  TWO) TIMES A WEEK. 24 patch 5  . omeprazole (PRILOSEC) 20 MG capsule Take 20 mg by mouth daily.    . Probiotic Product (PROBIOTIC PO) Take 1 tablet by mouth daily.    . promethazine (PHENERGAN) 25 MG tablet Take 25 mg by mouth every 6 (six) hours as needed.    . Vitamin D, Ergocalciferol, (DRISDOL) 1.25 MG (50000 UNIT) CAPS capsule Take 1 capsule (50,000 Units total) by mouth every 7 (seven) days. 12 capsule 0  . cyanocobalamin (,VITAMIN B-12,) 1000 MCG/ML injection INJECT 1 ML (1,000 MCG TOTAL) INTO THE MUSCLE ONCE FOR 1 DOSE. REPEAT MONTHLY. (Patient not taking: No sig reported) 1 mL 11  . escitalopram (LEXAPRO) 5 MG tablet TAKE 1 TABLET BY MOUTH EVERY DAY (Patient not taking: Reported on 12/05/2020) 90 tablet 0   No facility-administered medications prior to visit.    Allergies  Allergen Reactions  . Bee Venom Swelling  . Eggs Or Egg-Derived Products  Other (See Comments)    Breakout in sweats  . Lactose Nausea And Vomiting    No dairy, cheese, yogurt   Flowsheet Row Office Visit from 12/05/2020 in [place]  PHQ-9 Total Score 4     GAD 7 : Generalized Anxiety Score 12/05/2020  Nervous, Anxious, on Edge 3  Control/stop worrying 3  Worry too much - different things 3  Trouble relaxing 3  Restless 3  Easily annoyed or irritable 3  Afraid - awful might happen 1  Total GAD 7 Score 19  Anxiety Difficulty Not difficult at all      ROS Review of Systems  Respiratory: Negative for chest tightness.   Cardiovascular: Positive for palpitations. Negative for chest pain.  Gastrointestinal: Negative for nausea.  Neurological: Positive for numbness.       Numbness and tingling of hands and feet.  Psychiatric/Behavioral: The patient is nervous/anxious.       Objective:    Physical Exam Constitutional:      General: She is not in acute distress.    Appearance: Normal appearance.  Cardiovascular:     Rate and Rhythm: Normal rate and regular rhythm.     Pulses: Normal pulses.     Heart sounds: Normal heart sounds.  Pulmonary:     Effort: Pulmonary effort is normal.     Breath sounds: Normal breath sounds.  Musculoskeletal:     Right lower leg: No edema.     Left lower leg: No edema.  Skin:    General: Skin is warm and dry.     Findings: Lesion present.     Comments: Small, round, white, flat lesion on Right shoulder area. Size of pen tip.   Neurological:     Mental Status: She is alert.  Psychiatric:        Mood and Affect: Mood normal.        Behavior: Behavior normal.     BP 119/71   Pulse 67   Temp (!) 97.5 F (36.4 C) (Temporal)   Ht 5\' 6"  (1.676 m)   Wt 187 lb (84.8 kg)   SpO2 100%   BMI 30.18 kg/m  Wt Readings from Last 3 Encounters:  12/05/20 187 lb (84.8 kg)  09/19/20 179 lb (81.2 kg)  08/23/20 178 lb 12.8 oz (81.1 kg)   Pleasant patient, no signs of acute distress. Heart rate and  rhythm normal. Clear breath sounds to auscultation. Small, white, flat, round lesion on right shoulder above tattoo. Size of pen tip.   Health  Maintenance Due  Topic Date Due  . Hepatitis C Screening  Never done  . PNEUMOCOCCAL POLYSACCHARIDE VACCINE AGE 50-64 HIGH RISK  Never done  . FOOT EXAM  Never done  . OPHTHALMOLOGY EXAM  Never done  . URINE MICROALBUMIN  Never done  . HIV Screening  Never done  . TETANUS/TDAP  Never done  . HEMOGLOBIN A1C  04/28/2019  . COVID-19 Vaccine (3 - Booster for Pfizer series) 08/10/2020    There are no preventive care reminders to display for this patient.  Lab Results  Component Value Date   TSH 1.100 10/28/2018   Lab Results  Component Value Date   WBC 5.0 08/23/2020   HGB 12.6 08/23/2020   HCT 38.6 08/23/2020   MCV 90.2 08/23/2020   PLT 349 08/23/2020   Lab Results  Component Value Date   NA 138 08/23/2020   K 3.8 08/23/2020   CO2 25 08/23/2020   GLUCOSE 75 08/23/2020   BUN 10 08/23/2020   CREATININE 0.65 08/23/2020   BILITOT 0.3 08/23/2020   ALKPHOS 65 11/07/2019   AST 18 08/23/2020   ALT 15 08/23/2020   PROT 6.8 08/23/2020   ALBUMIN 3.6 11/07/2019   CALCIUM 9.7 08/23/2020   ANIONGAP 12 11/07/2019   No results found for: CHOL No results found for: HDL No results found for: LDLCALC No results found for: TRIG No results found for: CHOLHDL Lab Results  Component Value Date   HGBA1C 6.2 (H) 10/28/2018      Assessment & Plan:    Patient not currently taking her Lexapro. Will restart. Checking Vitamin B12, CBC, CMP. Referral entered to dermatology for skin lesion.   Problem List Items Addressed This Visit      Other   Anxiety    Patient no longer taking Lexapro. Will resume medication.      Relevant Orders   Vitamin B12   CBC with Differential/Platelet   Comprehensive metabolic panel   Ambulatory referral to Dermatology    Other Visit Diagnoses    Palpitations    -  Primary   Palpitations during bouts of  anxiety. Resume Lexapro.    Relevant Orders   Vitamin B12   CBC with Differential/Platelet   Comprehensive metabolic panel   Ambulatory referral to Dermatology   Tingling in extremities       Will check vitamin B12 level.    Relevant Orders   Vitamin B12   CBC with Differential/Platelet   Comprehensive metabolic panel   Ambulatory referral to Dermatology      No orders of the defined types were placed in this encounter.   Follow-up: Follow-up in 1 month.    Vanetta Mulders, RN

## 2020-12-06 LAB — CBC WITH DIFFERENTIAL/PLATELET
Absolute Monocytes: 548 cells/uL (ref 200–950)
Basophils Absolute: 100 cells/uL (ref 0–200)
Basophils Relative: 1.2 %
Eosinophils Absolute: 133 cells/uL (ref 15–500)
Eosinophils Relative: 1.6 %
HCT: 41.9 % (ref 35.0–45.0)
Hemoglobin: 13.7 g/dL (ref 11.7–15.5)
Lymphs Abs: 5229 cells/uL — ABNORMAL HIGH (ref 850–3900)
MCH: 28.8 pg (ref 27.0–33.0)
MCHC: 32.7 g/dL (ref 32.0–36.0)
MCV: 88 fL (ref 80.0–100.0)
MPV: 10.7 fL (ref 7.5–12.5)
Monocytes Relative: 6.6 %
Neutro Abs: 2291 cells/uL (ref 1500–7800)
Neutrophils Relative %: 27.6 %
Platelets: 404 10*3/uL — ABNORMAL HIGH (ref 140–400)
RBC: 4.76 10*6/uL (ref 3.80–5.10)
RDW: 12.7 % (ref 11.0–15.0)
Total Lymphocyte: 63 %
WBC: 8.3 10*3/uL (ref 3.8–10.8)

## 2020-12-06 LAB — COMPREHENSIVE METABOLIC PANEL
AG Ratio: 1.4 (calc) (ref 1.0–2.5)
ALT: 17 U/L (ref 6–29)
AST: 15 U/L (ref 10–30)
Albumin: 4 g/dL (ref 3.6–5.1)
Alkaline phosphatase (APISO): 60 U/L (ref 31–125)
BUN: 11 mg/dL (ref 7–25)
CO2: 23 mmol/L (ref 20–32)
Calcium: 9.5 mg/dL (ref 8.6–10.2)
Chloride: 104 mmol/L (ref 98–110)
Creat: 0.66 mg/dL (ref 0.50–1.10)
Globulin: 2.8 g/dL (calc) (ref 1.9–3.7)
Glucose, Bld: 84 mg/dL (ref 65–99)
Potassium: 4.3 mmol/L (ref 3.5–5.3)
Sodium: 138 mmol/L (ref 135–146)
Total Bilirubin: 0.3 mg/dL (ref 0.2–1.2)
Total Protein: 6.8 g/dL (ref 6.1–8.1)

## 2020-12-06 LAB — VITAMIN B12: Vitamin B-12: 183 pg/mL — ABNORMAL LOW (ref 200–1100)

## 2020-12-07 ENCOUNTER — Telehealth: Payer: Self-pay

## 2020-12-07 NOTE — Telephone Encounter (Signed)
Copied from CRM [id number]. Topic: General - Other >> Dec 06, 2020  8:29 AM Tamela Oddi wrote: Reason for CRM: Patient called to ask the nurse to call her to go over her blood test results.  She stated she had some questions about her white blood count.  CB# [phone]   Attempted to call patient but there was no answer and no option for a voicemail... Will call back.

## 2020-12-11 NOTE — Progress Notes (Deleted)
[name], Crystal Gross, FNP   No chief complaint on file.   HPI:      [name] is a 35 y.o. [id number] whose LMP was No LMP recorded. Patient has had a hysterectomy., presents today for *** S/p hyst; last annual 5/21 with Dr. Tiburcio Pea  Plan low dose hormone patch - estradiol (VIVELLE-DOT) 0.025 MG/24HR; Place 1 patch onto the skin 2 (two) times a week.  Dispense: 8 patch; Refill: 12  Past Medical History:  Diagnosis Date  . Abnormal Pap smear of cervix 05/01/2016  . Anemia   . Anxiety   . Complication of anesthesia    ITCHING  . GERD (gastroesophageal reflux disease)    NO MEDS  . Hyperthyroidism     Past Surgical History:  Procedure Laterality Date  . BARIATRIC SURGERY    . CESAREAN SECTION     X 4  . CHOLECYSTECTOMY    . COLPOSCOPY  06/18/2016  . CYSTOSCOPY N/A 04/15/2017   Procedure: CYSTOSCOPY;  Surgeon: Nadara Mustard, MD;  Location: [facility] ORS;  Service: Gynecology;  Laterality: N/A;  . LAPAROSCOPIC HYSTERECTOMY N/A 04/15/2017   Ovaries remain - Procedure: HYSTERECTOMY TOTAL LAPAROSCOPIC;  Surgeon: Nadara Mustard, MD;  Location: [facility] ORS;  Service: Gynecology;  Laterality: N/A;  . TONSILLECTOMY    . TUBAL LIGATION    . TUBAL LIGATION    . UPPER GASTROINTESTINAL ENDOSCOPY      Family History  Problem Relation Age of Onset  . Ataxia Mother        spinal cerebellar ataxia  . Stroke Father        ischemic  . Ataxia Sister   . Ataxia Brother   . Autism Son   . Autism Son   . Healthy Son   . Developmental delay Son     Social History   Socioeconomic History  . Marital status: Single    Spouse name: Not on file  . Number of children: Not on file  . Years of education: Not on file  . Highest education level: Not on file  Occupational History  . Not on file  Tobacco Use  . Smoking status: Never Smoker  . Smokeless tobacco: Never Used  Vaping Use  . Vaping Use: Never used  Substance and Sexual Activity  . Alcohol use: No  . Drug use: No   . Sexual activity: Yes    Birth control/protection: Surgical    Comment: Hysterectomy  Other Topics Concern  . Not on file  Social History Narrative  . Not on file   Social Determinants of Health   Financial Resource Strain: Not on file  Food Insecurity: Not on file  Transportation Needs: Not on file  Physical Activity: Not on file  Stress: Not on file  Social Connections: Not on file  Intimate Partner Violence: Not on file    Outpatient Medications Prior to Visit  Medication Sig Dispense Refill  . cyanocobalamin (,VITAMIN B-12,) 1000 MCG/ML injection INJECT 1 ML (1,000 MCG TOTAL) INTO THE MUSCLE ONCE FOR 1 DOSE. REPEAT MONTHLY. (Patient not taking: No sig reported) 1 mL 11  . escitalopram (LEXAPRO) 5 MG tablet TAKE 1 TABLET BY MOUTH EVERY DAY (Patient not taking: Reported on 12/05/2020) 90 tablet 0  . estradiol (VIVELLE-DOT) 0.025 MG/24HR PLACE 1 PATCH ONTO THE SKIN 2 (TWO) TIMES A WEEK. 24 patch 5  . omeprazole (PRILOSEC) 20 MG capsule Take 20 mg by mouth daily.    . Probiotic Product (PROBIOTIC PO) Take  1 tablet by mouth daily.    . promethazine (PHENERGAN) 25 MG tablet Take 25 mg by mouth every 6 (six) hours as needed.    . Vitamin D, Ergocalciferol, (DRISDOL) 1.25 MG (50000 UNIT) CAPS capsule Take 1 capsule (50,000 Units total) by mouth every 7 (seven) days. 12 capsule 0   No facility-administered medications prior to visit.      ROS:  Review of Systems BREAST: No symptoms   OBJECTIVE:   Vitals:  There were no vitals taken for this visit.  Physical Exam  Results: No results found for this or any previous visit (from the past 24 hour(s)).   Assessment/Plan: No diagnosis found.    No orders of the defined types were placed in this encounter.     No follow-ups on file.  Hawk Mones B. Jerelle Virden, PA-C 12/11/2020 3:57 PM

## 2020-12-12 ENCOUNTER — Ambulatory Visit: Payer: Medicaid Other | Admitting: Obstetrics and Gynecology

## 2020-12-12 ENCOUNTER — Ambulatory Visit (INDEPENDENT_AMBULATORY_CARE_PROVIDER_SITE_OTHER): Payer: Medicaid Other

## 2020-12-12 ENCOUNTER — Encounter: Payer: Self-pay | Admitting: Unknown Physician Specialty

## 2020-12-12 ENCOUNTER — Ambulatory Visit: Payer: Medicaid Other

## 2020-12-12 ENCOUNTER — Other Ambulatory Visit: Payer: Self-pay

## 2020-12-12 DIAGNOSIS — E538 Deficiency of other specified B group vitamins: Secondary | ICD-10-CM | POA: Diagnosis not present

## 2020-12-12 MED ORDER — CYANOCOBALAMIN 1000 MCG/ML IJ SOLN
1000.0000 ug | Freq: Once | INTRAMUSCULAR | Status: AC
  Administered 2020-12-12: 1000 ug via INTRAMUSCULAR

## 2020-12-12 NOTE — Progress Notes (Signed)
Patient presents for B 12 injection. Given IM Left Deltoid. Patient tolerated well.  

## 2020-12-22 ENCOUNTER — Ambulatory Visit: Payer: Medicaid Other | Admitting: Obstetrics & Gynecology

## 2021-01-09 ENCOUNTER — Ambulatory Visit: Payer: Medicaid Other

## 2021-01-09 ENCOUNTER — Other Ambulatory Visit: Payer: Self-pay

## 2021-01-10 ENCOUNTER — Ambulatory Visit (INDEPENDENT_AMBULATORY_CARE_PROVIDER_SITE_OTHER): Payer: Medicaid Other

## 2021-01-10 DIAGNOSIS — E538 Deficiency of other specified B group vitamins: Secondary | ICD-10-CM

## 2021-01-10 MED ORDER — CYANOCOBALAMIN 1000 MCG/ML IJ SOLN
1000.0000 ug | Freq: Once | INTRAMUSCULAR | Status: AC
  Administered 2021-01-10: 1000 ug via INTRAMUSCULAR

## 2021-01-10 NOTE — Progress Notes (Signed)
Patient presents for B 12 injection. Given IM Right Deltoid. Patient tolerated well.

## 2021-02-09 DIAGNOSIS — Z20822 Contact with and (suspected) exposure to covid-19: Secondary | ICD-10-CM | POA: Diagnosis not present

## 2021-02-15 DIAGNOSIS — H5213 Myopia, bilateral: Secondary | ICD-10-CM | POA: Diagnosis not present

## 2021-02-16 ENCOUNTER — Other Ambulatory Visit: Payer: Self-pay

## 2021-02-16 ENCOUNTER — Ambulatory Visit (INDEPENDENT_AMBULATORY_CARE_PROVIDER_SITE_OTHER): Payer: Medicaid Other

## 2021-02-16 DIAGNOSIS — E538 Deficiency of other specified B group vitamins: Secondary | ICD-10-CM | POA: Diagnosis not present

## 2021-02-16 MED ORDER — CYANOCOBALAMIN 1000 MCG/ML IJ SOLN
1000.0000 ug | Freq: Once | INTRAMUSCULAR | Status: AC
  Administered 2021-02-16: 1000 ug via INTRAMUSCULAR

## 2021-02-16 NOTE — Progress Notes (Signed)
Pt walked in for her B12 inj which was given Left deltoit.  NDC# [phone]

## 2021-03-07 ENCOUNTER — Other Ambulatory Visit: Payer: Self-pay | Admitting: Obstetrics & Gynecology

## 2021-03-08 ENCOUNTER — Ambulatory Visit: Payer: Self-pay | Admitting: *Deleted

## 2021-03-08 NOTE — Telephone Encounter (Signed)
Burning and knawling sensation at the mid stomach for about one month when stomach is empty. Usually just before eating.Nausea at times. Eating resolves pain. Tums helps at times. Bariatric surgery in 2020. Denies CP/SOB/diarrhea/fever. Appointment for 03/09/21 with Pamala Hurry, NP.  Reason for Disposition . Abdominal pain is a chronic symptom (recurrent or ongoing AND present > 4 weeks)  Answer Assessment - Initial Assessment Questions 1. LOCATION: "Where does it hurt?"      Mid abdomen 2. RADIATION: "Does the pain shoot anywhere else?" (e.g., chest, back)     no 3. ONSET: "When did the pain begin?" (e.g., minutes, hours or days ago)      One month ago 4. SUDDEN: "Gradual or sudden onset?"     gradual 5. PATTERN "Does the pain come and go, or is it constant?"    - If constant: "Is it getting better, staying the same, or worsening?"      (Note: Constant means the pain never goes away completely; most serious pain is constant and it progresses)     - If intermittent: "How long does it last?" "Do you have pain now?"     (Note: Intermittent means the pain goes away completely between bouts)     intermittent 6. SEVERITY: "How bad is the pain?"  (e.g., Scale 1-10; mild, moderate, or severe)    - MILD (1-3): doesn't interfere with normal activities, abdomen soft and not tender to touch     - MODERATE (4-7): interferes with normal activities or awakens from sleep, abdomen tender to touch     - SEVERE (8-10): excruciating pain, doubled over, unable to do any normal activities       severe 7. RECURRENT SYMPTOM: "Have you ever had this type of stomach pain before?" If Yes, ask: "When was the last time?" and "What happened that time?"      no 8. AGGRAVATING FACTORS: "Does anything seem to cause this pain?" (e.g., foods, stress, alcohol)     Empty stomach 9. CARDIAC SYMPTOMS: "Do you have any of the following symptoms: chest pain, difficulty breathing, sweating, nausea?"     Some nausea at times 10.  OTHER SYMPTOMS: "Do you have any other symptoms?" (e.g., back pain, diarrhea, fever, urination pain, vomiting)       no 11. PREGNANCY: "Is there any chance you are pregnant?" "When was your last menstrual period?"       na  Protocols used: ABDOMINAL PAIN - UPPER-A-AH

## 2021-03-09 ENCOUNTER — Ambulatory Visit: Payer: Self-pay | Admitting: Internal Medicine

## 2021-03-09 NOTE — Progress Notes (Deleted)
Subjective:    Patient ID: Crystal Mendez, female    DOB: 12/30/1985, 35 y.o.   MRN: [id number]  HPI  Pt presents to the clinic today with c/o abdominal pain. This started. She describes the pain as burning. She has a history of GERD and had gastric bypass in. She had a CT abd/pelvis 10/2019 which was normal. She takes Omeprazole as prescribed.  Review of Systems      Past Medical History:  Diagnosis Date  . Abnormal Pap smear of cervix 05/01/2016  . Anemia   . Anxiety   . Complication of anesthesia    ITCHING  . GERD (gastroesophageal reflux disease)    NO MEDS  . Hyperthyroidism     Current Outpatient Medications  Medication Sig Dispense Refill  . cyanocobalamin (,VITAMIN B-12,) 1000 MCG/ML injection INJECT 1 ML (1,000 MCG TOTAL) INTO THE MUSCLE ONCE FOR 1 DOSE. REPEAT MONTHLY. 3 mL 3  . escitalopram (LEXAPRO) 5 MG tablet TAKE 1 TABLET BY MOUTH EVERY DAY (Patient not taking: Reported on 12/05/2020) 90 tablet 0  . estradiol (VIVELLE-DOT) 0.025 MG/24HR PLACE 1 PATCH ONTO THE SKIN 2 (TWO) TIMES A WEEK. 24 patch 5  . omeprazole (PRILOSEC) 20 MG capsule Take 20 mg by mouth daily.    . Probiotic Product (PROBIOTIC PO) Take 1 tablet by mouth daily.    . promethazine (PHENERGAN) 25 MG tablet Take 25 mg by mouth every 6 (six) hours as needed.    . Vitamin D, Ergocalciferol, (DRISDOL) 1.25 MG (50000 UNIT) CAPS capsule Take 1 capsule (50,000 Units total) by mouth every 7 (seven) days. 12 capsule 0   No current facility-administered medications for this visit.    Allergies  Allergen Reactions  . Bee Venom Swelling  . Eggs Or Egg-Derived Products Other (See Comments)    Breakout in sweats  . Lactose Nausea And Vomiting    No dairy, cheese, yogurt    Family History  Problem Relation Age of Onset  . Ataxia Mother        spinal cerebellar ataxia  . Stroke Father        ischemic  . Ataxia Sister   . Ataxia Brother   . Autism Son   . Autism Son   . Healthy Son   .  Developmental delay Son     Social History   Socioeconomic History  . Marital status: Single    Spouse name: Not on file  . Number of children: Not on file  . Years of education: Not on file  . Highest education level: Not on file  Occupational History  . Not on file  Tobacco Use  . Smoking status: Never Smoker  . Smokeless tobacco: Never Used  Vaping Use  . Vaping Use: Never used  Substance and Sexual Activity  . Alcohol use: No  . Drug use: No  . Sexual activity: Yes    Birth control/protection: Surgical    Comment: Hysterectomy  Other Topics Concern  . Not on file  Social History Narrative  . Not on file   Social Determinants of Health   Financial Resource Strain: Not on file  Food Insecurity: Not on file  Transportation Needs: Not on file  Physical Activity: Not on file  Stress: Not on file  Social Connections: Not on file  Intimate Partner Violence: Not on file     Constitutional: Denies fever, malaise, fatigue, headache or abrupt weight changes.  HEENT: Denies eye pain, eye redness, ear pain, ringing in the  ears, wax buildup, runny nose, nasal congestion, bloody nose, or sore throat. Respiratory: Denies difficulty breathing, shortness of breath, cough or sputum production.   Cardiovascular: Denies chest pain, chest tightness, palpitations or swelling in the hands or feet.  Gastrointestinal: Pt reports abdominal pain. Denies bloating, constipation, diarrhea or blood in the stool.  GU: Denies urgency, frequency, pain with urination, burning sensation, blood in urine, odor or discharge. Musculoskeletal: Denies decrease in range of motion, difficulty with gait, muscle pain or joint pain and swelling.  Skin: Denies redness, rashes, lesions or ulcercations.  Neurological: Denies dizziness, difficulty with memory, difficulty with speech or problems with balance and coordination.  Psych: Denies anxiety, depression, SI/HI.  No other specific complaints in a complete  review of systems (except as listed in HPI above).  Objective:   Physical Exam   There were no vitals taken for this visit. Wt Readings from Last 3 Encounters:  12/05/20 187 lb (84.8 kg)  09/19/20 179 lb (81.2 kg)  08/23/20 178 lb 12.8 oz (81.1 kg)    General: Appears their stated age, well developed, well nourished in NAD. Skin: Warm, dry and intact. No rashes, lesions or ulcerations noted. HEENT: Head: normal shape and size; Eyes: sclera white, no icterus, conjunctiva pink, PERRLA and EOMs intact; Ears: Tm's gray and intact, normal light reflex; Nose: mucosa pink and moist, septum midline; Throat/Mouth: Teeth present, mucosa pink and moist, no exudate, lesions or ulcerations noted.  Neck:  Neck supple, trachea midline. No masses, lumps or thyromegaly present.  Cardiovascular: Normal rate and rhythm. S1,S2 noted.  No murmur, rubs or gallops noted. No JVD or BLE edema. No carotid bruits noted. Pulmonary/Chest: Normal effort and positive vesicular breath sounds. No respiratory distress. No wheezes, rales or ronchi noted.  Abdomen: Soft and nontender. Normal bowel sounds. No distention or masses noted. Liver, spleen and kidneys non palpable. Musculoskeletal: Normal range of motion. No signs of joint swelling. No difficulty with gait.  Neurological: Alert and oriented. Cranial nerves II-XII grossly intact. Coordination normal.  Psychiatric: Mood and affect normal. Behavior is normal. Judgment and thought content normal.   EKG:  BMET    Component Value Date/Time   NA 138 12/05/2020 1154   NA 143 01/30/2017 0914   K 4.3 12/05/2020 1154   CL 104 12/05/2020 1154   CO2 23 12/05/2020 1154   GLUCOSE 84 12/05/2020 1154   BUN 11 12/05/2020 1154   BUN 8 01/30/2017 0914   CREATININE 0.66 12/05/2020 1154   CALCIUM 9.5 12/05/2020 1154   GFRNONAA 117 08/23/2020 1213   GFRAA 136 08/23/2020 1213    Lipid Panel  No results found for: CHOL, TRIG, HDL, CHOLHDL, VLDL, LDLCALC  CBC     Component Value Date/Time   WBC 8.3 12/05/2020 1154   RBC 4.76 12/05/2020 1154   HGB 13.7 12/05/2020 1154   HGB 12.1 11/18/2017 1613   HCT 41.9 12/05/2020 1154   HCT 37.3 11/18/2017 1613   PLT 404 (H) 12/05/2020 1154   PLT 425 (H) 11/18/2017 1613   MCV 88.0 12/05/2020 1154   MCV 80 11/18/2017 1613   MCH 28.8 12/05/2020 1154   MCHC 32.7 12/05/2020 1154   RDW 12.7 12/05/2020 1154   RDW 16.3 (H) 11/18/2017 1613   LYMPHSABS 5,229 (H) 12/05/2020 1154   LYMPHSABS 4.3 (H) 11/18/2017 1613   MONOABS 1.0 11/07/2019 2146   EOSABS 133 12/05/2020 1154   EOSABS 0.1 11/18/2017 1613   BASOSABS 100 12/05/2020 1154   BASOSABS 0.0 11/18/2017 1613  Hgb A1C Lab Results  Component Value Date   HGBA1C 6.2 (H) 10/28/2018           Assessment & Plan:     Nicki Reaper, NP This visit occurred during the SARS-CoV-2 public health emergency.  Safety protocols were in place, including screening questions prior to the visit, additional usage of staff PPE, and extensive cleaning of exam room while observing appropriate contact time as indicated for disinfecting solutions.

## 2021-03-13 ENCOUNTER — Ambulatory Visit (INDEPENDENT_AMBULATORY_CARE_PROVIDER_SITE_OTHER): Payer: Medicaid Other

## 2021-03-13 ENCOUNTER — Other Ambulatory Visit: Payer: Self-pay

## 2021-03-13 ENCOUNTER — Ambulatory Visit: Payer: Self-pay | Admitting: Internal Medicine

## 2021-03-13 DIAGNOSIS — E538 Deficiency of other specified B group vitamins: Secondary | ICD-10-CM | POA: Diagnosis not present

## 2021-03-13 MED ORDER — CYANOCOBALAMIN 1000 MCG/ML IJ SOLN
1000.0000 ug | Freq: Once | INTRAMUSCULAR | Status: AC
  Administered 2021-03-13: 1000 ug via INTRAMUSCULAR

## 2021-03-13 NOTE — Progress Notes (Deleted)
Subjective:    Patient ID: Crystal Mendez, female    DOB: Apr 04, 1986, 35 y.o.   MRN: [id number]  HPI  Patient presents the clinic today with complaint of burning abdominal pain.  She noticed this.  She does have a history of reflux.  She is taking Omeprazole as prescribed.  She is status post gastric bypass.  Review of Systems      Past Medical History:  Diagnosis Date  . Abnormal Pap smear of cervix 05/01/2016  . Anemia   . Anxiety   . Complication of anesthesia    ITCHING  . GERD (gastroesophageal reflux disease)    NO MEDS  . Hyperthyroidism     Current Outpatient Medications  Medication Sig Dispense Refill  . cyanocobalamin (,VITAMIN B-12,) 1000 MCG/ML injection INJECT 1 ML (1,000 MCG TOTAL) INTO THE MUSCLE ONCE FOR 1 DOSE. REPEAT MONTHLY. 3 mL 3  . escitalopram (LEXAPRO) 5 MG tablet TAKE 1 TABLET BY MOUTH EVERY DAY (Patient not taking: Reported on 12/05/2020) 90 tablet 0  . estradiol (VIVELLE-DOT) 0.025 MG/24HR PLACE 1 PATCH ONTO THE SKIN 2 (TWO) TIMES A WEEK. 24 patch 5  . omeprazole (PRILOSEC) 20 MG capsule Take 20 mg by mouth daily.    . Probiotic Product (PROBIOTIC PO) Take 1 tablet by mouth daily.    . promethazine (PHENERGAN) 25 MG tablet Take 25 mg by mouth every 6 (six) hours as needed.    . Vitamin D, Ergocalciferol, (DRISDOL) 1.25 MG (50000 UNIT) CAPS capsule Take 1 capsule (50,000 Units total) by mouth every 7 (seven) days. 12 capsule 0   No current facility-administered medications for this visit.    Allergies  Allergen Reactions  . Bee Venom Swelling  . Eggs Or Egg-Derived Products Other (See Comments)    Breakout in sweats  . Lactose Nausea And Vomiting    No dairy, cheese, yogurt    Family History  Problem Relation Age of Onset  . Ataxia Mother        spinal cerebellar ataxia  . Stroke Father        ischemic  . Ataxia Sister   . Ataxia Brother   . Autism Son   . Autism Son   . Healthy Son   . Developmental delay Son     Social  History   Socioeconomic History  . Marital status: Single    Spouse name: Not on file  . Number of children: Not on file  . Years of education: Not on file  . Highest education level: Not on file  Occupational History  . Not on file  Tobacco Use  . Smoking status: Never Smoker  . Smokeless tobacco: Never Used  Vaping Use  . Vaping Use: Never used  Substance and Sexual Activity  . Alcohol use: No  . Drug use: No  . Sexual activity: Yes    Birth control/protection: Surgical    Comment: Hysterectomy  Other Topics Concern  . Not on file  Social History Narrative  . Not on file   Social Determinants of Health   Financial Resource Strain: Not on file  Food Insecurity: Not on file  Transportation Needs: Not on file  Physical Activity: Not on file  Stress: Not on file  Social Connections: Not on file  Intimate Partner Violence: Not on file     Constitutional: Denies fever, malaise, fatigue, headache or abrupt weight changes.  HEENT: Denies eye pain, eye redness, ear pain, ringing in the ears, wax buildup, runny nose, nasal  congestion, bloody nose, or sore throat. Respiratory: Denies difficulty breathing, shortness of breath, cough or sputum production.   Cardiovascular: Denies chest pain, chest tightness, palpitations or swelling in the hands or feet.  Gastrointestinal: Patient reports abdominal pain.  Denies bloating, constipation, diarrhea or blood in the stool.  GU: Denies urgency, frequency, pain with urination, burning sensation, blood in urine, odor or discharge. Musculoskeletal: Denies decrease in range of motion, difficulty with gait, muscle pain or joint pain and swelling.  Skin: Denies redness, rashes, lesions or ulcercations.  Neurological: Denies dizziness, difficulty with memory, difficulty with speech or problems with balance and coordination.  Psych: Denies anxiety, depression, SI/HI.  No other specific complaints in a complete review of systems (except as  listed in HPI above).  Objective:   Physical Exam  There were no vitals taken for this visit. Wt Readings from Last 3 Encounters:  12/05/20 187 lb (84.8 kg)  09/19/20 179 lb (81.2 kg)  08/23/20 178 lb 12.8 oz (81.1 kg)    General: Appears their stated age, well developed, well nourished in NAD. Skin: Warm, dry and intact. No rashes, lesions or ulcerations noted. HEENT: Head: normal shape and size; Eyes: sclera white, no icterus, conjunctiva pink, PERRLA and EOMs intact; Ears: Tm's gray and intact, normal light reflex; Nose: mucosa pink and moist, septum midline; Throat/Mouth: Teeth present, mucosa pink and moist, no exudate, lesions or ulcerations noted.  Neck:  Neck supple, trachea midline. No masses, lumps or thyromegaly present.  Cardiovascular: Normal rate and rhythm. S1,S2 noted.  No murmur, rubs or gallops noted. No JVD or BLE edema. No carotid bruits noted. Pulmonary/Chest: Normal effort and positive vesicular breath sounds. No respiratory distress. No wheezes, rales or ronchi noted.  Abdomen: Soft and nontender. Normal bowel sounds. No distention or masses noted. Liver, spleen and kidneys non palpable. Musculoskeletal: Normal range of motion. No signs of joint swelling. No difficulty with gait.  Neurological: Alert and oriented. Cranial nerves II-XII grossly intact. Coordination normal.  Psychiatric: Mood and affect normal. Behavior is normal. Judgment and thought content normal.   EKG:  BMET    Component Value Date/Time   NA 138 12/05/2020 1154   NA 143 01/30/2017 0914   K 4.3 12/05/2020 1154   CL 104 12/05/2020 1154   CO2 23 12/05/2020 1154   GLUCOSE 84 12/05/2020 1154   BUN 11 12/05/2020 1154   BUN 8 01/30/2017 0914   CREATININE 0.66 12/05/2020 1154   CALCIUM 9.5 12/05/2020 1154   GFRNONAA 117 08/23/2020 1213   GFRAA 136 08/23/2020 1213    Lipid Panel  No results found for: CHOL, TRIG, HDL, CHOLHDL, VLDL, LDLCALC  CBC    Component Value Date/Time   WBC 8.3  12/05/2020 1154   RBC 4.76 12/05/2020 1154   HGB 13.7 12/05/2020 1154   HGB 12.1 11/18/2017 1613   HCT 41.9 12/05/2020 1154   HCT 37.3 11/18/2017 1613   PLT 404 (H) 12/05/2020 1154   PLT 425 (H) 11/18/2017 1613   MCV 88.0 12/05/2020 1154   MCV 80 11/18/2017 1613   MCH 28.8 12/05/2020 1154   MCHC 32.7 12/05/2020 1154   RDW 12.7 12/05/2020 1154   RDW 16.3 (H) 11/18/2017 1613   LYMPHSABS 5,229 (H) 12/05/2020 1154   LYMPHSABS 4.3 (H) 11/18/2017 1613   MONOABS 1.0 11/07/2019 2146   EOSABS 133 12/05/2020 1154   EOSABS 0.1 11/18/2017 1613   BASOSABS 100 12/05/2020 1154   BASOSABS 0.0 11/18/2017 1613    Hgb A1C Lab Results  Component Value Date   HGBA1C 6.2 (H) 10/28/2018            Assessment & Plan:    Nicki Reaper, NP This visit occurred during the SARS-CoV-2 public health emergency.  Safety protocols were in place, including screening questions prior to the visit, additional usage of staff PPE, and extensive cleaning of exam room while observing appropriate contact time as indicated for disinfecting solutions.

## 2021-03-13 NOTE — Progress Notes (Signed)
Pt here for her B12 inj which was given IM left deltoid.  NDC# [phone]

## 2021-04-11 ENCOUNTER — Ambulatory Visit (INDEPENDENT_AMBULATORY_CARE_PROVIDER_SITE_OTHER): Payer: Medicaid Other

## 2021-04-11 ENCOUNTER — Other Ambulatory Visit: Payer: Self-pay

## 2021-04-11 DIAGNOSIS — E538 Deficiency of other specified B group vitamins: Secondary | ICD-10-CM | POA: Diagnosis not present

## 2021-04-11 MED ORDER — CYANOCOBALAMIN 1000 MCG/ML IJ SOLN
1000.0000 ug | Freq: Once | INTRAMUSCULAR | Status: AC
  Administered 2021-04-11: 1000 ug via INTRAMUSCULAR

## 2021-04-11 NOTE — Progress Notes (Signed)
Pt here for her B 12 inj which was given IM left deltoid.  NDC# 89373-428-76  Pt states she is feeling bad and is shakey.  Is on a diet too.  Had a liquid with her and said it was her meal.  Adv portion control, fresh fruits and vegetables and lean meat.

## 2021-05-16 ENCOUNTER — Ambulatory Visit (INDEPENDENT_AMBULATORY_CARE_PROVIDER_SITE_OTHER): Payer: Medicaid Other

## 2021-05-16 ENCOUNTER — Other Ambulatory Visit: Payer: Self-pay

## 2021-05-16 DIAGNOSIS — E538 Deficiency of other specified B group vitamins: Secondary | ICD-10-CM

## 2021-05-16 MED ORDER — CYANOCOBALAMIN 1000 MCG/ML IJ SOLN
1000.0000 ug | Freq: Once | INTRAMUSCULAR | Status: AC
  Administered 2021-05-16: 1000 ug via INTRAMUSCULAR

## 2021-05-16 NOTE — Progress Notes (Signed)
Pt here for her B12 inj which was given IM right deltoid.  NDC# [phone]

## 2021-06-07 ENCOUNTER — Ambulatory Visit: Payer: Medicaid Other | Admitting: Dermatology

## 2021-06-20 ENCOUNTER — Ambulatory Visit: Payer: Medicaid Other

## 2021-07-06 ENCOUNTER — Ambulatory Visit (INDEPENDENT_AMBULATORY_CARE_PROVIDER_SITE_OTHER): Payer: Medicaid Other

## 2021-07-06 ENCOUNTER — Other Ambulatory Visit: Payer: Self-pay

## 2021-07-06 VITALS — Wt 187.0 lb

## 2021-07-06 DIAGNOSIS — E538 Deficiency of other specified B group vitamins: Secondary | ICD-10-CM

## 2021-07-06 MED ORDER — CYANOCOBALAMIN 1000 MCG/ML IJ SOLN
1000.0000 ug | Freq: Once | INTRAMUSCULAR | Status: AC
  Administered 2021-07-06: 1000 ug via INTRAMUSCULAR

## 2021-07-06 NOTE — Progress Notes (Signed)
Pt here for vitamin B12 inj which was given IM left deltoid.  NDC# 69678-938-10

## 2021-07-27 DIAGNOSIS — S80862A Insect bite (nonvenomous), left lower leg, initial encounter: Secondary | ICD-10-CM | POA: Diagnosis not present

## 2021-07-27 DIAGNOSIS — W57XXXA Bitten or stung by nonvenomous insect and other nonvenomous arthropods, initial encounter: Secondary | ICD-10-CM | POA: Diagnosis not present

## 2021-08-03 ENCOUNTER — Ambulatory Visit (INDEPENDENT_AMBULATORY_CARE_PROVIDER_SITE_OTHER): Payer: Medicaid Other

## 2021-08-03 ENCOUNTER — Other Ambulatory Visit: Payer: Self-pay

## 2021-08-03 DIAGNOSIS — E538 Deficiency of other specified B group vitamins: Secondary | ICD-10-CM | POA: Diagnosis not present

## 2021-08-03 MED ORDER — CYANOCOBALAMIN 1000 MCG/ML IJ SOLN
1000.0000 ug | Freq: Once | INTRAMUSCULAR | Status: AC
  Administered 2021-08-03: 1000 ug via INTRAMUSCULAR

## 2021-08-03 NOTE — Progress Notes (Signed)
Patient presents for B 12 injection. Given IM Right Deltoid. Patient tolerated well. 

## 2021-09-06 ENCOUNTER — Ambulatory Visit: Payer: Self-pay | Admitting: *Deleted

## 2021-09-06 NOTE — Telephone Encounter (Signed)
Patient experiencing left side tooth pain for 3 days, swollen soar to the touch. Patient dentist is not able to see her. No appointments with PCP until 09/11/2021. Patient seeking clinical advice   Pt reports toothache x 3 days, top left. Mild facial swelling. States "I know I have a cavity, I can feel the hole. dentist cannot see me."  Reported 10/10 pain, tylenol ineffective. Requesting antibiotics be called in. Advised would not be called in without being seen. Several no shows with Rene Kocher. Advised UC. Pt states will follow disposition.    Reason for Disposition  [1] SEVERE pain (e.g., excruciating, unable to do any normal activities) AND [2] not improved 2 hours after pain medicine  Answer Assessment - Initial Assessment Questions 1. LOCATION: "Which tooth is hurting?"  (e.g., right-side/left-side, upper/lower, front/back)     Left side, top 2. ONSET: "When did the toothache start?"  (e.g., hours, days)      3 days 3. SEVERITY: "How bad is the toothache?"  (Scale 1-10; mild, moderate or severe)   - MILD (1-3): doesn't interfere with chewing    - MODERATE (4-7): interferes with chewing, interferes with normal activities, awakens from sleep     - SEVERE (8-10): unable to eat, unable to do any normal activities, excruciating pain        10/10 4. SWELLING: "Is there any visible swelling of your face?"     Yes, mild 5. OTHER SYMPTOMS: "Do you have any other symptoms?" (e.g., fever)     no 6. PREGNANCY: "Is there any chance you are pregnant?" "When was your last menstrual period?"   no  Protocols used: Toothache-A-AH

## 2021-09-10 DIAGNOSIS — Z20822 Contact with and (suspected) exposure to covid-19: Secondary | ICD-10-CM | POA: Diagnosis not present

## 2021-09-10 DIAGNOSIS — R059 Cough, unspecified: Secondary | ICD-10-CM | POA: Diagnosis not present

## 2021-09-26 ENCOUNTER — Ambulatory Visit (INDEPENDENT_AMBULATORY_CARE_PROVIDER_SITE_OTHER): Payer: Medicaid Other

## 2021-09-26 ENCOUNTER — Other Ambulatory Visit: Payer: Self-pay

## 2021-09-26 ENCOUNTER — Ambulatory Visit: Payer: Medicaid Other

## 2021-09-26 DIAGNOSIS — E538 Deficiency of other specified B group vitamins: Secondary | ICD-10-CM

## 2021-09-26 MED ORDER — CYANOCOBALAMIN 1000 MCG/ML IJ SOLN
1000.0000 ug | Freq: Once | INTRAMUSCULAR | Status: AC
  Administered 2021-09-26: 1000 ug via INTRAMUSCULAR

## 2021-09-26 NOTE — Progress Notes (Signed)
Pt here for B12 inj which was given IM left deltoid.  Pt tolerated well.  NDC# 32202-542-70  Pt stated she needed to get some iron and energy; stays extra cold all there time.  Adv to get an iron supp otc; pt states she needs vitamin D as well.  Adv to take these supp at a diff time of day from her multivitamin.

## 2021-11-15 ENCOUNTER — Telehealth: Payer: Self-pay

## 2021-11-15 ENCOUNTER — Ambulatory Visit: Payer: Medicaid Other

## 2021-11-15 NOTE — Telephone Encounter (Signed)
Pt calling; having trouble getting her B12 refill; pharm saying it's not covered til Feb which isn't true b/c she gets it monthly.  [phone]  Called pharm; spoke c Tobi Bastos who said pt last p/u 10/28/21 so it's too early to p/u another.  Pt aware; did not get inj for Jan yet; was last given 09/26/21; asked pt if maybe she has p/u and forgot where she put it; sugg she take some otc B12 to het her to Feb that maybe that would take the edge off. Pt states she'll keep looking for it; adv maybe the pharm documented on the wrong pt on 10/28/21 - to call pharm and see.

## 2021-11-19 ENCOUNTER — Other Ambulatory Visit (HOSPITAL_COMMUNITY)
Admission: RE | Admit: 2021-11-19 | Discharge: 2021-11-19 | Disposition: A | Discharge status: Home / Self Care | Payer: Medicaid Other | Source: Ambulatory Visit | Attending: Obstetrics and Gynecology | Admitting: Obstetrics and Gynecology

## 2021-11-19 ENCOUNTER — Encounter: Payer: Self-pay | Admitting: Obstetrics and Gynecology

## 2021-11-19 ENCOUNTER — Other Ambulatory Visit: Payer: Self-pay

## 2021-11-19 ENCOUNTER — Ambulatory Visit (INDEPENDENT_AMBULATORY_CARE_PROVIDER_SITE_OTHER): Payer: Medicaid Other | Admitting: Obstetrics and Gynecology

## 2021-11-19 VITALS — BP 100/70 | Ht 66.0 in | Wt 192.0 lb

## 2021-11-19 DIAGNOSIS — Z113 Encounter for screening for infections with a predominantly sexual mode of transmission: Secondary | ICD-10-CM | POA: Insufficient documentation

## 2021-11-19 DIAGNOSIS — R635 Abnormal weight gain: Secondary | ICD-10-CM

## 2021-11-19 DIAGNOSIS — N898 Other specified noninflammatory disorders of vagina: Secondary | ICD-10-CM | POA: Insufficient documentation

## 2021-11-19 DIAGNOSIS — L680 Hirsutism: Secondary | ICD-10-CM | POA: Diagnosis not present

## 2021-11-19 LAB — POCT WET PREP WITH KOH
Clue Cells Wet Prep HPF POC: NEGATIVE
KOH Prep POC: NEGATIVE
Trichomonas, UA: NEGATIVE
Yeast Wet Prep HPF POC: NEGATIVE

## 2021-11-19 NOTE — Progress Notes (Signed)
[name], Crystal Raider, FNP   Chief Complaint  Patient presents with   Follow-up    Hair growth in chest, weight gain concern    HPI:      [name] is a 36 y.o. [id number] whose LMP was No LMP recorded. Patient has had a hysterectomy., presents today for hair growth on check and neck, has always had sx but worse recently for several months. Waxes/plucks hair. Has also had wt gain for several months. S/p bariatric surg with wt loss in past. Has gained ~13# on our scale since 11/21. Pt states she is exercising all the time and hasn't changed her diet. Is taking her bariatric supp.  S/p hyst for AUB. Still has ovaries. Has been under increased stress, has occas night sweats.  Neg pap 02/24/20.  She is sex active; hx of chlam in past. Neg STD testing 11/21. Wants STD testing. Pt using some vaginal product that "cleanses/exfoliates the tissue" and has noticed some white tissue with d/c recently.    Patient Active Problem List   Diagnosis Date Noted   Anxiety 08/23/2020   Iron deficiency anemia 09/05/2016   Gastroesophageal reflux disease without esophagitis 08/05/2016   Prediabetes 08/05/2016    Past Surgical History:  Procedure Laterality Date   BARIATRIC SURGERY     CESAREAN SECTION     X 4   CHOLECYSTECTOMY     COLPOSCOPY  06/18/2016   CYSTOSCOPY N/A 04/15/2017   Procedure: CYSTOSCOPY;  Surgeon: Gae Dry, MD;  Location: [facility];  Service: Gynecology;  Laterality: N/A;   LAPAROSCOPIC HYSTERECTOMY N/A 04/15/2017   Ovaries remain - Procedure: HYSTERECTOMY TOTAL LAPAROSCOPIC;  Surgeon: Gae Dry, MD;  Location: [facility];  Service: Gynecology;  Laterality: N/A;   TONSILLECTOMY     TUBAL LIGATION     TUBAL LIGATION     UPPER GASTROINTESTINAL ENDOSCOPY      Family History  Problem Relation Age of Onset   Ataxia Mother        spinal cerebellar ataxia   Stroke Father        ischemic   Ataxia Sister    Ataxia Brother    Autism Son    Autism Son     Healthy Son    Developmental delay Son     Social History   Socioeconomic History   Marital status: Single    Spouse name: Not on file   Number of children: Not on file   Years of education: Not on file   Highest education level: Not on file  Occupational History   Not on file  Tobacco Use   Smoking status: Never   Smokeless tobacco: Never  Vaping Use   Vaping Use: Never used  Substance and Sexual Activity   Alcohol use: No   Drug use: No   Sexual activity: Yes    Birth control/protection: Surgical    Comment: Hysterectomy  Other Topics Concern   Not on file  Social History Narrative   Not on file   Social Determinants of Health   Financial Resource Strain: Not on file  Food Insecurity: Not on file  Transportation Needs: Not on file  Physical Activity: Not on file  Stress: Not on file  Social Connections: Not on file  Intimate Partner Violence: Not on file    Outpatient Medications Prior to Visit  Medication Sig Dispense Refill   cyanocobalamin (,VITAMIN B-12,) 1000 MCG/ML injection INJECT 1 ML (1,000 MCG TOTAL) INTO THE MUSCLE ONCE  FOR 1 DOSE. REPEAT MONTHLY. 3 mL 3   escitalopram (LEXAPRO) 5 MG tablet TAKE 1 TABLET BY MOUTH EVERY DAY (Patient not taking: Reported on 12/05/2020) 90 tablet 0   estradiol (VIVELLE-DOT) 0.025 MG/24HR PLACE 1 PATCH ONTO THE SKIN 2 (TWO) TIMES A WEEK. 24 patch 5   omeprazole (PRILOSEC) 20 MG capsule Take 20 mg by mouth daily.     Probiotic Product (PROBIOTIC PO) Take 1 tablet by mouth daily.     promethazine (PHENERGAN) 25 MG tablet Take 25 mg by mouth every 6 (six) hours as needed.     Vitamin D, Ergocalciferol, (DRISDOL) 1.25 MG (50000 UNIT) CAPS capsule Take 1 capsule (50,000 Units total) by mouth every 7 (seven) days. 12 capsule 0   No facility-administered medications prior to visit.      ROS:  Review of Systems  Constitutional:  Negative for fever.  Gastrointestinal:  Negative for blood in stool, constipation, diarrhea,  nausea and vomiting.  Genitourinary:  Negative for dyspareunia, dysuria, flank pain, frequency, hematuria, urgency, vaginal bleeding, vaginal discharge and vaginal pain.  Musculoskeletal:  Negative for back pain.  Skin:  Negative for rash.  BREAST: No symptoms   OBJECTIVE:   Vitals:  BP 100/70    Ht 5\' 6"  (1.676 m)    Wt 192 lb (87.1 kg)    BMI 30.99 kg/m   Physical Exam Vitals reviewed.  Constitutional:      Appearance: She is well-developed.  Pulmonary:     Effort: Pulmonary effort is normal.  Genitourinary:    General: Normal vulva.     Pubic Area: No rash.      Labia:        Right: No rash, tenderness or lesion.        Left: No rash, tenderness or lesion.      Vagina: Normal. No vaginal discharge, erythema or tenderness.     Uterus: Absent.      Adnexa: Right adnexa normal and left adnexa normal.       Right: No mass or tenderness.         Left: No mass or tenderness.       Comments: NEG D/C BUT SMALL CLUMPS OF WHITE ON VAG EXAM Musculoskeletal:        General: Normal range of motion.     Cervical back: Normal range of motion.  Skin:    General: Skin is warm and dry.       Neurological:     General: No focal deficit present.     Mental Status: She is alert and oriented to person, place, and time.  Psychiatric:        Mood and Affect: Mood normal.        Behavior: Behavior normal.        Thought Content: Thought content normal.        Judgment: Judgment normal.   Results for orders placed or performed in visit on 11/19/21 (from the past 24 hour(s))  POCT Wet Prep with KOH     Status: Normal   Collection Time: 11/19/21  5:07 PM  Result Value Ref Range   Trichomonas, UA Negative    Clue Cells Wet Prep HPF POC neg    Epithelial Wet Prep HPF POC     Yeast Wet Prep HPF POC neg    Bacteria Wet Prep HPF POC     RBC Wet Prep HPF POC     WBC Wet Prep HPF POC     KOH Prep  POC Negative Negative    Assessment/Plan: Hirsutism - Plan: TSH + free T4, Hemoglobin A1c,  Testosterone,Free and Total; check labs. If neg, most likely normal for her. Could try spironolactone.   Weight gain - Plan: TSH + free T4, Hemoglobin A1c, Comprehensive metabolic panel; check labs. If neg, pt needs to tweak diet again like post surgery.   Vaginal discharge - Plan: Cervicovaginal ancillary only, POCT Wet Prep with KOH; neg wet prep. Check culture. Looks like sloughing vaginal tissue due to exfoliant cleanser. D/C that regimen.   Screening for STD (sexually transmitted disease) - Plan: Cervicovaginal ancillary only    Return if symptoms worsen or fail to improve.  Tobenna Needs B. Avantae Bither, PA-C 11/19/2021 5:11 PM

## 2021-11-20 LAB — CERVICOVAGINAL ANCILLARY ONLY
Bacterial Vaginitis (gardnerella): NEGATIVE
Candida Glabrata: NEGATIVE
Candida Vaginitis: NEGATIVE
Chlamydia: NEGATIVE
Comment: NEGATIVE
Comment: NEGATIVE
Comment: NEGATIVE
Comment: NEGATIVE
Comment: NEGATIVE
Comment: NORMAL
Neisseria Gonorrhea: NEGATIVE
Trichomonas: NEGATIVE

## 2021-11-24 LAB — COMPREHENSIVE METABOLIC PANEL
ALT: 14 IU/L (ref 0–32)
AST: 20 IU/L (ref 0–40)
Albumin/Globulin Ratio: 1.7 (ref 1.2–2.2)
Albumin: 4.2 g/dL (ref 3.8–4.8)
Alkaline Phosphatase: 65 IU/L (ref 44–121)
BUN/Creatinine Ratio: 15 (ref 9–23)
BUN: 10 mg/dL (ref 6–20)
Bilirubin Total: 0.3 mg/dL (ref 0.0–1.2)
CO2: 25 mmol/L (ref 20–29)
Calcium: 9.3 mg/dL (ref 8.7–10.2)
Chloride: 102 mmol/L (ref 96–106)
Creatinine, Ser: 0.65 mg/dL (ref 0.57–1.00)
Globulin, Total: 2.5 g/dL (ref 1.5–4.5)
Glucose: 83 mg/dL (ref 70–99)
Potassium: 4.2 mmol/L (ref 3.5–5.2)
Sodium: 138 mmol/L (ref 134–144)
Total Protein: 6.7 g/dL (ref 6.0–8.5)
eGFR: 119 mL/min/{1.73_m2} (ref >59)

## 2021-11-24 LAB — TSH+FREE T4
Free T4: 1 ng/dL (ref 0.82–1.77)
TSH: 0.723 u[IU]/mL (ref 0.450–4.500)

## 2021-11-24 LAB — HEMOGLOBIN A1C
Est. average glucose Bld gHb Est-mCnc: 117 mg/dL
Hgb A1c MFr Bld: 5.7 % — ABNORMAL HIGH (ref 4.8–5.6)

## 2021-11-24 LAB — TESTOSTERONE,FREE AND TOTAL
Testosterone, Free: 1.4 pg/mL (ref 0.0–4.2)
Testosterone: 22 ng/dL (ref 8–60)

## 2021-12-13 ENCOUNTER — Other Ambulatory Visit: Payer: Self-pay

## 2021-12-13 ENCOUNTER — Ambulatory Visit (INDEPENDENT_AMBULATORY_CARE_PROVIDER_SITE_OTHER): Payer: Medicaid Other

## 2021-12-13 DIAGNOSIS — E538 Deficiency of other specified B group vitamins: Secondary | ICD-10-CM

## 2021-12-13 MED ORDER — CYANOCOBALAMIN 1000 MCG/ML IJ SOLN
1000.0000 ug | Freq: Once | INTRAMUSCULAR | Status: AC
  Administered 2021-12-13: 1000 ug via INTRAMUSCULAR

## 2021-12-13 NOTE — Progress Notes (Signed)
Pt here for B 12 inj which was given IM left deltoid.  NDC# [id number] 

## 2022-01-07 ENCOUNTER — Other Ambulatory Visit: Payer: Self-pay

## 2022-01-07 ENCOUNTER — Encounter (HOSPITAL_COMMUNITY): Payer: Self-pay

## 2022-01-07 ENCOUNTER — Emergency Department: Payer: Medicaid Other

## 2022-01-07 ENCOUNTER — Observation Stay (HOSPITAL_COMMUNITY)
Admission: AD | Admit: 2022-01-07 | Discharge: 2022-01-08 | Disposition: A | Discharge status: Home / Self Care | Payer: Medicaid Other | Source: Other Acute Inpatient Hospital | Attending: Surgery | Admitting: Surgery

## 2022-01-07 ENCOUNTER — Observation Stay
Admission: EM | Admit: 2022-01-07 | Discharge: 2022-01-07 | Disposition: A | Discharge status: Other Acute Inpatient Hospital | Payer: Medicaid Other | Attending: Surgery | Admitting: Surgery

## 2022-01-07 DIAGNOSIS — R7303 Prediabetes: Secondary | ICD-10-CM | POA: Insufficient documentation

## 2022-01-07 DIAGNOSIS — S36113A Laceration of liver, unspecified degree, initial encounter: Secondary | ICD-10-CM | POA: Diagnosis present

## 2022-01-07 DIAGNOSIS — R079 Chest pain, unspecified: Secondary | ICD-10-CM | POA: Diagnosis not present

## 2022-01-07 DIAGNOSIS — R519 Headache, unspecified: Secondary | ICD-10-CM | POA: Insufficient documentation

## 2022-01-07 DIAGNOSIS — S199XXA Unspecified injury of neck, initial encounter: Secondary | ICD-10-CM | POA: Insufficient documentation

## 2022-01-07 DIAGNOSIS — R001 Bradycardia, unspecified: Secondary | ICD-10-CM | POA: Diagnosis not present

## 2022-01-07 DIAGNOSIS — E039 Hypothyroidism, unspecified: Secondary | ICD-10-CM | POA: Insufficient documentation

## 2022-01-07 DIAGNOSIS — S36115A Moderate laceration of liver, initial encounter: Secondary | ICD-10-CM | POA: Diagnosis present

## 2022-01-07 DIAGNOSIS — Y92410 Unspecified street and highway as the place of occurrence of the external cause: Secondary | ICD-10-CM | POA: Insufficient documentation

## 2022-01-07 DIAGNOSIS — R0789 Other chest pain: Secondary | ICD-10-CM | POA: Diagnosis not present

## 2022-01-07 DIAGNOSIS — R0902 Hypoxemia: Secondary | ICD-10-CM | POA: Diagnosis not present

## 2022-01-07 DIAGNOSIS — Z041 Encounter for examination and observation following transport accident: Secondary | ICD-10-CM | POA: Diagnosis not present

## 2022-01-07 DIAGNOSIS — S36119A Unspecified injury of liver, initial encounter: Secondary | ICD-10-CM | POA: Diagnosis present

## 2022-01-07 DIAGNOSIS — M542 Cervicalgia: Secondary | ICD-10-CM | POA: Diagnosis not present

## 2022-01-07 LAB — TYPE AND SCREEN
ABO/RH(D): O POS
ABO/RH(D): O POS
Antibody Screen: NEGATIVE
Antibody Screen: NEGATIVE

## 2022-01-07 LAB — COMPREHENSIVE METABOLIC PANEL
ALT: 20 U/L (ref 0–44)
AST: 23 U/L (ref 15–41)
Albumin: 3.8 g/dL (ref 3.5–5.0)
Alkaline Phosphatase: 57 U/L (ref 38–126)
Anion gap: 7 (ref 5–15)
BUN: 9 mg/dL (ref 6–20)
CO2: 25 mmol/L (ref 22–32)
Calcium: 8.7 mg/dL — ABNORMAL LOW (ref 8.9–10.3)
Chloride: 105 mmol/L (ref 98–111)
Creatinine, Ser: 0.75 mg/dL (ref 0.44–1.00)
GFR, Estimated: >60 mL/min (ref >60)
Glucose, Bld: 97 mg/dL (ref 70–99)
Potassium: 3.3 mmol/L — ABNORMAL LOW (ref 3.5–5.1)
Sodium: 137 mmol/L (ref 135–145)
Total Bilirubin: 0.5 mg/dL (ref 0.3–1.2)
Total Protein: 7.1 g/dL (ref 6.5–8.1)

## 2022-01-07 LAB — CBC WITH DIFFERENTIAL/PLATELET
Abs Immature Granulocytes: 0.02 10*3/uL (ref 0.00–0.07)
Basophils Absolute: 0.1 10*3/uL (ref 0.0–0.1)
Basophils Relative: 1 %
Eosinophils Absolute: 0.1 10*3/uL (ref 0.0–0.5)
Eosinophils Relative: 1 %
HCT: 41.6 % (ref 36.0–46.0)
Hemoglobin: 13 g/dL (ref 12.0–15.0)
Immature Granulocytes: 0 %
Lymphocytes Relative: 48 %
Lymphs Abs: 2.7 10*3/uL (ref 0.7–4.0)
MCH: 28.4 pg (ref 26.0–34.0)
MCHC: 31.3 g/dL (ref 30.0–36.0)
MCV: 91 fL (ref 80.0–100.0)
Monocytes Absolute: 0.4 10*3/uL (ref 0.1–1.0)
Monocytes Relative: 8 %
Neutro Abs: 2.4 10*3/uL (ref 1.7–7.7)
Neutrophils Relative %: 42 %
Platelets: 294 10*3/uL (ref 150–400)
RBC: 4.57 MIL/uL (ref 3.87–5.11)
RDW: 12.4 % (ref 11.5–15.5)
WBC: 5.6 10*3/uL (ref 4.0–10.5)
nRBC: 0 % (ref 0.0–0.2)

## 2022-01-07 LAB — HEMOGLOBIN AND HEMATOCRIT, BLOOD
HCT: 39.9 % (ref 36.0–46.0)
Hemoglobin: 12.7 g/dL (ref 12.0–15.0)

## 2022-01-07 LAB — HCG, QUANTITATIVE, PREGNANCY: hCG, Beta Chain, Quant, S: <1 m[IU]/mL (ref <5)

## 2022-01-07 MED ORDER — DOCUSATE SODIUM 100 MG PO CAPS
100.0000 mg | ORAL_CAPSULE | Freq: Two times a day (BID) | ORAL | Status: DC
  Filled 2022-01-07: qty 1

## 2022-01-07 MED ORDER — FENTANYL CITRATE PF 50 MCG/ML IJ SOSY
PREFILLED_SYRINGE | INTRAMUSCULAR | Status: DC
Start: 2022-01-07 — End: 2022-01-07
  Filled 2022-01-07: qty 2

## 2022-01-07 MED ORDER — METHOCARBAMOL 500 MG PO TABS
500.0000 mg | ORAL_TABLET | Freq: Four times a day (QID) | ORAL | Status: DC
  Administered 2022-01-07: 500 mg via ORAL
  Filled 2022-01-07: qty 1

## 2022-01-07 MED ORDER — MORPHINE SULFATE (PF) 4 MG/ML IV SOLN
4.0000 mg | Freq: Once | INTRAVENOUS | Status: AC
  Administered 2022-01-07: 4 mg via INTRAVENOUS
  Filled 2022-01-07: qty 1

## 2022-01-07 MED ORDER — FENTANYL CITRATE PF 50 MCG/ML IJ SOSY
100.0000 ug | PREFILLED_SYRINGE | Freq: Once | INTRAMUSCULAR | Status: AC
  Administered 2022-01-07: 100 ug via INTRAVENOUS

## 2022-01-07 MED ORDER — TRAMADOL HCL 50 MG PO TABS: 50.0000 mg | ORAL_TABLET | Freq: Four times a day (QID) | ORAL | Status: DC | PRN

## 2022-01-07 MED ORDER — IOHEXOL 300 MG/ML  SOLN
100.0000 mL | Freq: Once | INTRAMUSCULAR | Status: AC | PRN
  Administered 2022-01-07: 100 mL via INTRAVENOUS

## 2022-01-07 MED ORDER — ONDANSETRON 4 MG PO TBDP: 4.0000 mg | ORAL_TABLET | Freq: Four times a day (QID) | ORAL | Status: DC | PRN

## 2022-01-07 MED ORDER — HYDROMORPHONE HCL 1 MG/ML IJ SOLN: 0.5000 mg | INTRAMUSCULAR | Status: DC | PRN

## 2022-01-07 MED ORDER — ONDANSETRON HCL 4 MG/2ML IJ SOLN: 4.0000 mg | Freq: Four times a day (QID) | INTRAMUSCULAR | Status: DC | PRN

## 2022-01-07 MED ORDER — ACETAMINOPHEN 500 MG PO TABS
1000.0000 mg | ORAL_TABLET | Freq: Three times a day (TID) | ORAL | Status: DC
  Administered 2022-01-07: 1000 mg via ORAL
  Filled 2022-01-07: qty 2

## 2022-01-07 MED ORDER — ENOXAPARIN SODIUM 30 MG/0.3ML IJ SOSY: 30.0000 mg | PREFILLED_SYRINGE | Freq: Two times a day (BID) | INTRAMUSCULAR | Status: DC

## 2022-01-07 NOTE — ED Notes (Signed)
Per Dr. Larinda Buttery he asked me to call [facility] and inform them that Dr. Claudine Mouton  was not able to accept patient and inform Dr. Bedelia Person of status  ?
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
## 2022-01-07 NOTE — ED Notes (Signed)
Called and spoke to Aniwa at [facility] waiting for bed assignment ?
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
## 2022-01-08 DIAGNOSIS — S36113A Laceration of liver, unspecified degree, initial encounter: Secondary | ICD-10-CM | POA: Diagnosis not present

## 2022-01-08 LAB — CBC
HCT: 40.1 % (ref 36.0–46.0)
Hemoglobin: 13 g/dL (ref 12.0–15.0)
MCH: 29 pg (ref 26.0–34.0)
MCHC: 32.4 g/dL (ref 30.0–36.0)
MCV: 89.5 fL (ref 80.0–100.0)
Platelets: 283 10*3/uL (ref 150–400)
RBC: 4.48 MIL/uL (ref 3.87–5.11)
RDW: 12.2 % (ref 11.5–15.5)
WBC: 5.9 10*3/uL (ref 4.0–10.5)
nRBC: 0 % (ref 0.0–0.2)

## 2022-01-08 MED ORDER — METHOCARBAMOL 500 MG PO TABS
500.0000 mg | ORAL_TABLET | Freq: Three times a day (TID) | ORAL | 0 refills | Status: DC | PRN
Start: 2022-01-08 — End: 2024-01-16

## 2022-01-08 MED ORDER — ACETAMINOPHEN 500 MG PO TABS: 1000.0000 mg | ORAL_TABLET | Freq: Three times a day (TID) | ORAL | 0 refills | Status: AC

## 2022-01-08 MED ORDER — TRAMADOL HCL 50 MG PO TABS: 50.0000 mg | ORAL_TABLET | Freq: Four times a day (QID) | ORAL | 0 refills | Status: DC | PRN
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
## 2022-01-08 NOTE — Progress Notes (Signed)
Patient given discharge instructions. Has no complaints at the current time.Will d/c via WC. ?

## 2022-01-14 ENCOUNTER — Encounter: Payer: Self-pay | Admitting: Physician Assistant

## 2022-01-14 ENCOUNTER — Other Ambulatory Visit: Payer: Self-pay

## 2022-01-14 ENCOUNTER — Telehealth: Payer: Self-pay | Admitting: Family Medicine

## 2022-01-14 ENCOUNTER — Ambulatory Visit: Payer: Medicaid Other | Admitting: Physician Assistant

## 2022-01-14 ENCOUNTER — Ambulatory Visit: Payer: Self-pay | Admitting: *Deleted

## 2022-01-14 DIAGNOSIS — S060XAD Concussion with loss of consciousness status unknown, subsequent encounter: Secondary | ICD-10-CM | POA: Diagnosis not present

## 2022-01-14 DIAGNOSIS — S060XAA Concussion with loss of consciousness status unknown, initial encounter: Secondary | ICD-10-CM | POA: Insufficient documentation

## 2022-01-14 DIAGNOSIS — S36115D Moderate laceration of liver, subsequent encounter: Secondary | ICD-10-CM | POA: Diagnosis not present

## 2022-01-14 MED ORDER — HYDROCODONE-ACETAMINOPHEN 10-325 MG PO TABS: 0.5000 | ORAL_TABLET | Freq: Four times a day (QID) | ORAL | 0 refills | Status: AC | PRN

## 2022-01-14 MED ORDER — HYDROCODONE-ACETAMINOPHEN 5-325 MG PO TABS: 1.0000 | ORAL_TABLET | Freq: Four times a day (QID) | ORAL | 0 refills | Status: DC | PRN

## 2022-01-14 NOTE — Assessment & Plan Note (Signed)
Acute, new problem ?Appears stable at this time ?Recommend continued monitoring ?Reviewed discharge instructions with patient and her concerns for rib injury over liver  ?Recommend she continue to follow discharge instructions and seek emergency services if she notices worsening symptoms ?Recommend rest and symptomatic management consisting of pain control, staying well hydrated, reducing use of liver toxic substances and refraining from strenuous activity  ?Follow up in 1-2 weeks for monitoring  ?
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
## 2022-01-14 NOTE — Telephone Encounter (Signed)
RX for Norco 10-325 has been sent to the pharmacy.  ? ?Thanks,  ? ?-Vernona Rieger  ?
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
## 2022-01-14 NOTE — Telephone Encounter (Signed)
Pt made an additional call regarding this request, please advise.  ?
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
## 2022-01-14 NOTE — Telephone Encounter (Signed)
Pt is at the pharmacy waiting / she stated that they dont have any HYDROcodone-acetaminophen (NORCO/VICODIN) 5-325 MG tablet and if the Rx can be changed to Oxycodone Acetaminophen 5-325MG  / please advise asap / pt is going to wait at the pharmacy  ?

## 2022-01-14 NOTE — Patient Instructions (Addendum)
I am sending in a script for 5 days of Hydrocodone- Acetaminophen 5mg -375 mg which can be taken up to 4 times per day. IF  you are taking the Hydrocodone-Acetaminophen please only take 1 Tylenol 500 mg every 8 hours.  ?We do not want you to take more than 3 grams of Tylenol per day as this can hurt your liver  ? ?Please pick up a Naloxone spray from the pharmacy to have on hand in case of overdose.  ? ?Please keep your apt with physical therapy.  ?I have included some information about concussions and brain rest. Please use those techniques to assist with your headaches and concussion.  ?

## 2022-01-15 DIAGNOSIS — M79641 Pain in right hand: Secondary | ICD-10-CM | POA: Diagnosis not present

## 2022-01-15 DIAGNOSIS — M25531 Pain in right wrist: Secondary | ICD-10-CM | POA: Diagnosis not present

## 2022-01-16 ENCOUNTER — Ambulatory Visit: Payer: Self-pay | Admitting: *Deleted

## 2022-01-16 ENCOUNTER — Encounter: Payer: Self-pay | Admitting: *Deleted

## 2022-01-16 ENCOUNTER — Ambulatory Visit: Payer: Medicaid Other | Admitting: Family Medicine
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
## 2022-01-16 NOTE — Telephone Encounter (Signed)
Patient called in asking to speak to a nurse states that she is having having lots of anxiety issues and not able to sleep, underarm sweating more than normal and throbbing of her head and cant think straight even to have a simple conversation. Patient can be reached at Ph# [phone] ? ?  ?  ?Attempted to reach pt, left VM to call back to discuss symptoms. ?

## 2022-01-17 ENCOUNTER — Encounter: Payer: Self-pay | Admitting: Physician Assistant

## 2022-01-17 ENCOUNTER — Ambulatory Visit (INDEPENDENT_AMBULATORY_CARE_PROVIDER_SITE_OTHER): Payer: Medicaid Other | Admitting: Physician Assistant

## 2022-01-17 VITALS — BP 108/74 | HR 76 | Temp 97.3°F | Wt 191.0 lb

## 2022-01-17 DIAGNOSIS — S060XAD Concussion with loss of consciousness status unknown, subsequent encounter: Secondary | ICD-10-CM

## 2022-01-17 DIAGNOSIS — F4323 Adjustment disorder with mixed anxiety and depressed mood: Secondary | ICD-10-CM

## 2022-01-17 DIAGNOSIS — F418 Other specified anxiety disorders: Secondary | ICD-10-CM | POA: Insufficient documentation

## 2022-01-17 MED ORDER — HYDROXYZINE HCL 10 MG PO TABS: 10.0000 mg | ORAL_TABLET | Freq: Three times a day (TID) | ORAL | 0 refills | Status: DC | PRN

## 2022-01-17 MED ORDER — SERTRALINE HCL 25 MG PO TABS: 25.0000 mg | ORAL_TABLET | Freq: Every day | ORAL | 3 refills | Status: DC
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
## 2022-01-21 DIAGNOSIS — K219 Gastro-esophageal reflux disease without esophagitis: Secondary | ICD-10-CM | POA: Diagnosis not present

## 2022-01-21 DIAGNOSIS — Z713 Dietary counseling and surveillance: Secondary | ICD-10-CM | POA: Diagnosis not present

## 2022-01-21 DIAGNOSIS — D509 Iron deficiency anemia, unspecified: Secondary | ICD-10-CM | POA: Diagnosis not present

## 2022-01-21 DIAGNOSIS — R7303 Prediabetes: Secondary | ICD-10-CM | POA: Diagnosis not present

## 2022-01-21 DIAGNOSIS — Z9884 Bariatric surgery status: Secondary | ICD-10-CM | POA: Diagnosis not present

## 2022-02-07 DIAGNOSIS — Z713 Dietary counseling and surveillance: Secondary | ICD-10-CM | POA: Diagnosis not present

## 2022-02-07 DIAGNOSIS — Z9884 Bariatric surgery status: Secondary | ICD-10-CM | POA: Diagnosis not present

## 2022-02-08 ENCOUNTER — Other Ambulatory Visit: Payer: Self-pay | Admitting: Physician Assistant

## 2022-02-08 DIAGNOSIS — F4323 Adjustment disorder with mixed anxiety and depressed mood: Secondary | ICD-10-CM
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
## 2022-02-13 ENCOUNTER — Encounter: Payer: Self-pay | Admitting: Obstetrics and Gynecology

## 2022-02-20 ENCOUNTER — Ambulatory Visit (INDEPENDENT_AMBULATORY_CARE_PROVIDER_SITE_OTHER): Payer: Medicaid Other

## 2022-02-20 DIAGNOSIS — E538 Deficiency of other specified B group vitamins: Secondary | ICD-10-CM | POA: Diagnosis not present

## 2022-02-20 MED ORDER — CYANOCOBALAMIN 1000 MCG/ML IJ SOLN
1000.0000 ug | Freq: Once | INTRAMUSCULAR | Status: AC
  Administered 2022-02-20: 1000 ug via INTRAMUSCULAR

## 2022-02-20 NOTE — Progress Notes (Signed)
Patient presents for B 12 injection. Given IM Left Deltoid. Patient tolerated well.  

## 2022-02-24 DIAGNOSIS — J069 Acute upper respiratory infection, unspecified: Secondary | ICD-10-CM | POA: Diagnosis not present

## 2022-03-11 ENCOUNTER — Other Ambulatory Visit: Payer: Self-pay

## 2022-03-21 ENCOUNTER — Telehealth: Payer: Self-pay

## 2022-03-21 NOTE — Telephone Encounter (Signed)
Copied from CRM [id number]. Topic: General - Inquiry >> Mar 21, 2022  2:44 PM Kandis Cocking wrote: Reason for CRM: Patient called in requesting a prescription for Mounjaro injectible to be called in to her new insurance with [employer] through [place]. She is currently on phentermine and she states it is not helping. Was trying to assist the patient with scheduling but she stated with her schedule it is hard to do an appointment . She requested for someone to contact her.

## 2022-03-21 NOTE — Telephone Encounter (Signed)
Pt needs an office visit.  Virtual visit schedule for 03/25/2022 at 4pm.  Pt advised.   Thanks,   -Vernona Rieger

## 2022-03-25 ENCOUNTER — Encounter: Payer: Self-pay | Admitting: Internal Medicine

## 2022-03-25 ENCOUNTER — Telehealth (INDEPENDENT_AMBULATORY_CARE_PROVIDER_SITE_OTHER): Payer: Medicaid Other | Admitting: Internal Medicine

## 2022-03-25 DIAGNOSIS — Z6830 Body mass index (BMI) 30.0-30.9, adult: Secondary | ICD-10-CM

## 2022-03-25 DIAGNOSIS — R7303 Prediabetes: Secondary | ICD-10-CM | POA: Diagnosis not present

## 2022-03-25 DIAGNOSIS — E6609 Other obesity due to excess calories: Secondary | ICD-10-CM | POA: Insufficient documentation

## 2022-03-25 MED ORDER — TIRZEPATIDE 2.5 MG/0.5ML ~~LOC~~ SOAJ: 2.5000 mg | SUBCUTANEOUS | 0 refills | Status: DC

## 2022-03-25 NOTE — Assessment & Plan Note (Signed)
We will try [facility] Advised her without a diagnosis of DM2 that this will likely be denied by her insurance and we will need to try [employer] low-carb diet and exercise for weight loss

## 2022-03-25 NOTE — Assessment & Plan Note (Signed)
We will try Mounjaro Advised her without a diagnosis of DM2 that this will likely be denied by her insurance and we will need to try Ozempic Encourage low-carb diet and exercise for weight loss 

## 2022-03-25 NOTE — Progress Notes (Signed)
Virtual Visit via Video Note  I connected with Crystal Mendez on 03/25/22 at  4:00 PM EDT by a video enabled telemedicine application and verified that I am speaking with the correct person using two identifiers.  Location: Patient: Work Provider: Office  Persons participating in this video call: Nicki Reaper, NP and Gabriel Rainwater   I discussed the limitations of evaluation and management by telemedicine and the availability of in person appointments. The patient expressed understanding and agreed to proceed.  History of Present Illness:    Patient wanting to discuss her weight loss options.  She is status post gastric bypass.  Her weight continues to fluctuate. Her current weight today is 200 lbs.  She has failed low-carb/keto diet, Metformin in the past. She would like to try Mounjaro.  Past Medical History:  Diagnosis Date   Abnormal Pap smear of cervix 05/01/2016   Anemia    Anxiety    Complication of anesthesia    ITCHING   GERD (gastroesophageal reflux disease)    NO MEDS   Hyperthyroidism    Pre-diabetes     Current Outpatient Medications  Medication Sig Dispense Refill   acetaminophen (TYLENOL) 500 MG tablet Take 2 tablets (1,000 mg total) by mouth 3 (three) times daily. 30 tablet 0   cyanocobalamin (,VITAMIN B-12,) 1000 MCG/ML injection INJECT 1 ML (1,000 MCG TOTAL) INTO THE MUSCLE ONCE FOR 1 DOSE. REPEAT MONTHLY. (Patient taking differently: Inject 1,000 mcg into the muscle every 30 (thirty) days.) 3 mL 3   hydrOXYzine (ATARAX) 10 MG tablet Take 1 tablet (10 mg total) by mouth 3 (three) times daily as needed. 30 tablet 0   methocarbamol (ROBAXIN) 500 MG tablet Take 1-2 tablets (500-1,000 mg total) by mouth every 8 (eight) hours as needed for muscle spasms. 60 tablet 0   Probiotic Product (PROBIOTIC PO) Take 1 tablet by mouth daily.     sertraline (ZOLOFT) 25 MG tablet TAKE 1 TABLET (25 MG TOTAL) BY MOUTH DAILY. 90 tablet 0   No current facility-administered  medications for this visit.    Allergies  Allergen Reactions   Bee Venom Swelling   Eggs Or Egg-Derived Products Other (See Comments)    Breakout in sweats   Lactose Nausea And Vomiting    No dairy, cheese, yogurt    Family History  Problem Relation Age of Onset   Ataxia Mother        spinal cerebellar ataxia   Stroke Father        ischemic   Ataxia Sister    Ataxia Brother    Autism Son    Autism Son    Healthy Son    Developmental delay Son     Social History   Socioeconomic History   Marital status: Single    Spouse name: Not on file   Number of children: Not on file   Years of education: Not on file   Highest education level: Not on file  Occupational History   Not on file  Tobacco Use   Smoking status: Never   Smokeless tobacco: Never  Vaping Use   Vaping Use: Never used  Substance and Sexual Activity   Alcohol use: No   Drug use: No   Sexual activity: Yes    Birth control/protection: Surgical    Comment: Hysterectomy  Other Topics Concern   Not on file  Social History Narrative   Not on file   Social Determinants of Health   Financial Resource Strain: Not on file  Food Insecurity: Not on file  Transportation Needs: Not on file  Physical Activity: Not on file  Stress: Not on file  Social Connections: Not on file  Intimate Partner Violence: Not on file     Constitutional: Denies fever, malaise, fatigue, headache or abrupt weight changes.  Respiratory: Denies difficulty breathing, shortness of breath, cough or sputum production.   Cardiovascular: Denies chest pain, chest tightness, palpitations or swelling in the hands or feet.   No other specific complaints in a complete review of systems (except as listed in HPI above).  Observations/Objective:  There were no vitals taken for this visit. Wt Readings from Last 3 Encounters:  01/17/22 191 lb (86.6 kg)  01/14/22 190 lb (86.2 kg)  01/07/22 182 lb 15.7 oz (83 kg)    General: Appears her  stated age, obese, in NAD. Pulmonary/Chest: Normal effort. No respiratory distress.   BMET    Component Value Date/Time   NA 137 01/07/2022 0853   NA 138 11/19/2021 1035   K 3.3 (L) 01/07/2022 0853   CL 105 01/07/2022 0853   CO2 25 01/07/2022 0853   GLUCOSE 97 01/07/2022 0853   BUN 9 01/07/2022 0853   BUN 10 11/19/2021 1035   CREATININE 0.75 01/07/2022 0853   CREATININE 0.66 12/05/2020 1154   CALCIUM 8.7 (L) 01/07/2022 0853   GFRNONAA >60 01/07/2022 0853   GFRNONAA 117 08/23/2020 1213   GFRAA 136 08/23/2020 1213    Lipid Panel  No results found for: CHOL, TRIG, HDL, CHOLHDL, VLDL, LDLCALC  CBC    Component Value Date/Time   WBC 5.9 01/08/2022 0416   RBC 4.48 01/08/2022 0416   HGB 13.0 01/08/2022 0416   HGB 12.1 11/18/2017 1613   HCT 40.1 01/08/2022 0416   HCT 37.3 11/18/2017 1613   PLT 283 01/08/2022 0416   PLT 425 (H) 11/18/2017 1613   MCV 89.5 01/08/2022 0416   MCV 80 11/18/2017 1613   MCH 29.0 01/08/2022 0416   MCHC 32.4 01/08/2022 0416   RDW 12.2 01/08/2022 0416   RDW 16.3 (H) 11/18/2017 1613   LYMPHSABS 2.7 01/07/2022 0853   LYMPHSABS 4.3 (H) 11/18/2017 1613   MONOABS 0.4 01/07/2022 0853   EOSABS 0.1 01/07/2022 0853   EOSABS 0.1 11/18/2017 1613   BASOSABS 0.1 01/07/2022 0853   BASOSABS 0.0 11/18/2017 1613    Hgb A1C Lab Results  Component Value Date   HGBA1C 5.7 (H) 11/19/2021       Assessment and Plan:   Follow Up Instructions:    I discussed the assessment and treatment plan with the patient. The patient was provided an opportunity to ask questions and all were answered. The patient agreed with the plan and demonstrated an understanding of the instructions.   The patient was advised to call back or seek an in-person evaluation if the symptoms worsen or if the condition fails to improve as anticipated.    Nicki Reaper, NP

## 2022-03-25 NOTE — Patient Instructions (Signed)

## 2022-03-26 ENCOUNTER — Telehealth: Payer: Self-pay

## 2022-03-26 NOTE — Telephone Encounter (Signed)
Copied from CRM [id number]. Topic: General - Other >> Mar 26, 2022  7:45 AM Traci Sermon wrote: Reason for CRM: Pt called in wanting to know if PA was sent yesterday on her medication tirzepatide [facility]) 2.5 MG/0.5ML Pen, please advise.

## 2022-03-26 NOTE — Telephone Encounter (Signed)
PA sent though Cover My Meds.    Thanks,  -Shayde Gervacio  

## 2022-03-27 ENCOUNTER — Ambulatory Visit: Payer: Self-pay

## 2022-03-27 ENCOUNTER — Encounter: Payer: Self-pay | Admitting: Internal Medicine

## 2022-03-27 NOTE — Telephone Encounter (Signed)
Patient called back for status update

## 2022-03-27 NOTE — Telephone Encounter (Signed)
Mounjaro denied.  See My Chart message.   Thanks,   -Vernona Rieger

## 2022-03-27 NOTE — Telephone Encounter (Signed)
Duplicate message. See MyChart message.

## 2022-03-27 NOTE — Telephone Encounter (Signed)
Called and spoke with Lurena Joiner, [facility] about the medication being denied, she advised me that when she runs the medication it is denied for not in pharmacy network so transferred to pharmacy help desk to give better explaination of why denial. Spoke with [place] who provided me with info of medication isnt covered under pts plan and is out of network. Will call and let me pt know this info. Pt called, LVM to return call to nurse to discuss denial.   Summary: Pt med denied by ins and wants fu from nurse to why/will not reaach out to ins   Pt has called in stating she called pharmacy in re to tirzepatide [facility]) 2.5 MG/0.5ML Pen and states it was denied. Asked pt the cause of denial and she said she had no idea, told pt if she couldprovide the reason of denial we may could speed up process but she refused asking for the nurse to call her back and discuss why it may have been denied. Pls fu with pt @ [phone]

## 2022-03-27 NOTE — Telephone Encounter (Signed)
Spoke with patient- patient states she was notified that the medication is not covered because it was sent for weight loss- patient wants to know if it can be coded for prediabetes because she has been struggling with that- if it will not be covered with that code - then please let her know her other options. Please call her back

## 2022-03-28 ENCOUNTER — Other Ambulatory Visit: Payer: Self-pay | Admitting: Internal Medicine

## 2022-03-28 MED ORDER — OZEMPIC (0.25 OR 0.5 MG/DOSE) 2 MG/1.5ML ~~LOC~~ SOPN: 0.2500 mg | PEN_INJECTOR | SUBCUTANEOUS | 0 refills | Status: DC

## 2022-03-29 ENCOUNTER — Telehealth: Payer: Self-pay | Admitting: Internal Medicine

## 2022-03-29 MED ORDER — OZEMPIC (0.25 OR 0.5 MG/DOSE) 2 MG/3ML ~~LOC~~ SOPN: 0.5000 mg | PEN_INJECTOR | SUBCUTANEOUS | 0 refills | Status: DC

## 2022-03-29 NOTE — Addendum Note (Signed)
Addended by: Lorre Munroe on: 03/29/2022 01:16 PM   Modules accepted: Orders

## 2022-03-29 NOTE — Telephone Encounter (Signed)
Pt called to see if the PA for Ozempic was submitted / please advise

## 2022-03-29 NOTE — Telephone Encounter (Signed)
Requested medication (s) are due for refill today:   Yes  Requested medication (s) are on the active medication list:   Yes but no longer available in dose ordered  Future visit scheduled:   No   Last ordered: 03/28/2022  See pharmacy note.  Needs new Rx   Requested Prescriptions  Pending Prescriptions Disp Refills   OZEMPIC, 0.25 OR 0.5 MG/DOSE, 2 MG/3ML SOPN [Pharmacy Med Name: OZEMPIC 0.25-0.5 MG/DOSE PEN]  0    Sig: Please specify directions, refills and quantity     There is no refill protocol information for this order

## 2022-04-01 ENCOUNTER — Telehealth: Payer: Self-pay

## 2022-04-01 NOTE — Telephone Encounter (Signed)
Advised pt that her Ozempic was denied.  She states she has been diabetic since 2017 and failed metformin in the past.   She would like me do her PA again as having diabetes.  Is it okay to use the diabetes ICD-10 code?   Thanks,   -Vernona Rieger

## 2022-04-01 NOTE — Telephone Encounter (Signed)
Pt advised.   Thanks,   -Leitha Hyppolite  

## 2022-04-01 NOTE — Telephone Encounter (Signed)
Pt received a message that her RX for Ozempic was denied / she called to see why it was denied/ please advise

## 2022-04-01 NOTE — Telephone Encounter (Signed)
Pt is asking for a call back she received a message that her ozempic was denied

## 2022-04-01 NOTE — Telephone Encounter (Signed)
Pt is requesting an update on PA for medication.

## 2022-04-02 NOTE — Telephone Encounter (Signed)
No because I have no documentation that she has diabetes, only prediabetes. If she wants to bring me some paperwork that shows that she has diabetes, we can resubmit.

## 2022-04-03 NOTE — Telephone Encounter (Signed)
Patient called and says that she spoke to Vernona Rieger earlier in the week and was waiting on a return call to let her know if the Ozempic would be approved or what the provider wants to do about it. I advised I will send this to Vernona Rieger for a return call once she's received the provider recommendation. Patient verbalized understanding.

## 2022-04-03 NOTE — Telephone Encounter (Signed)
Pt called in and is very rude regarding this PA, pt would like to speak directly with Mickel Baas.

## 2022-04-04 NOTE — Telephone Encounter (Signed)
Pt's insurance denied Ozempic again.  Pt advised.   Thanks,   -Vernona Rieger

## 2022-04-04 NOTE — Telephone Encounter (Signed)
Pt advised.   Thanks,   -Benedetto Ryder  

## 2022-04-17 ENCOUNTER — Telehealth: Payer: Self-pay

## 2022-04-17 NOTE — Telephone Encounter (Signed)
Patient contacted office stating that she is diabetic and was previously on Metformin but states that she stopped medication because it made her feel sick. Patient is calling to inquire if she can start new medication to help with diabetes. LMTCB, patient would have to reach out to her PCP who manages her diabetes and schedule an office visit to discuss medication change. If patient returns call please advise her to reach out to PCP. KW

## 2022-04-17 NOTE — Telephone Encounter (Signed)
Pt called back, has been off metformin for over a year. Dr Tiburcio Pea managed her med. I advised to call her PCP to get her on correct medication and also that she is overdue for her annual.

## 2022-07-24 ENCOUNTER — Ambulatory Visit: Payer: Medicaid Other | Admitting: Internal Medicine

## 2022-07-24 ENCOUNTER — Ambulatory Visit: Payer: Self-pay | Admitting: *Deleted

## 2022-07-24 NOTE — Telephone Encounter (Signed)
Voicemail left. Offered MyChart virtual visit. Asked pt to return call for home care information.

## 2022-07-24 NOTE — Progress Notes (Deleted)
Subjective:    Patient ID: Crystal Mendez, female    DOB: 04-Jan-1986, 36 y.o.   MRN: [id number]  HPI  Patient presents to clinic today for her annual exam.  Flu: 09/2019 Tetanus: COVID: Pfizer x2 Pap smear: Hysterectomy Dentist:  Diet: Exercise:  Review of Systems     Past Medical History:  Diagnosis Date   Abnormal Pap smear of cervix 05/01/2016   Anemia    Anxiety    Complication of anesthesia    ITCHING   GERD (gastroesophageal reflux disease)    NO MEDS   Hyperthyroidism    Pre-diabetes     Current Outpatient Medications  Medication Sig Dispense Refill   acetaminophen (TYLENOL) 500 MG tablet Take 2 tablets (1,000 mg total) by mouth 3 (three) times daily. 30 tablet 0   hydrOXYzine (ATARAX) 10 MG tablet Take 1 tablet (10 mg total) by mouth 3 (three) times daily as needed. 30 tablet 0   methocarbamol (ROBAXIN) 500 MG tablet Take 1-2 tablets (500-1,000 mg total) by mouth every 8 (eight) hours as needed for muscle spasms. 60 tablet 0   OZEMPIC, 0.25 OR 0.5 MG/DOSE, 2 MG/3ML SOPN Inject 0.5 mg into the skin once a week. Start with 0.$RemoveBefo'25mg'IpFrlndDtsW$  weekly x 4 weeks then increase to 0.$RemoveBefor'5mg'EVfItDOmjGNc$  weekly injection. 9 mL 0   Probiotic Product (PROBIOTIC PO) Take 1 tablet by mouth daily.     sertraline (ZOLOFT) 25 MG tablet TAKE 1 TABLET (25 MG TOTAL) BY MOUTH DAILY. 90 tablet 0   No current facility-administered medications for this visit.    Allergies  Allergen Reactions   Bee Venom Swelling   Eggs Or Egg-Derived Products Other (See Comments)    Breakout in sweats   Lactose Nausea And Vomiting    No dairy, cheese, yogurt    Family History  Problem Relation Age of Onset   Ataxia Mother        spinal cerebellar ataxia   Stroke Father        ischemic   Ataxia Sister    Ataxia Brother    Autism Son    Autism Son    Healthy Son    Developmental delay Son     Social History   Socioeconomic History   Marital status: Single    Spouse name: Not on file   Number of  children: Not on file   Years of education: Not on file   Highest education level: Not on file  Occupational History   Not on file  Tobacco Use   Smoking status: Never   Smokeless tobacco: Never  Vaping Use   Vaping Use: Never used  Substance and Sexual Activity   Alcohol use: No   Drug use: No   Sexual activity: Yes    Birth control/protection: Surgical    Comment: Hysterectomy  Other Topics Concern   Not on file  Social History Narrative   Not on file   Social Determinants of Health   Financial Resource Strain: Not on file  Food Insecurity: Not on file  Transportation Needs: Not on file  Physical Activity: Not on file  Stress: Not on file  Social Connections: Not on file  Intimate Partner Violence: Not on file     Constitutional: Denies fever, malaise, fatigue, headache or abrupt weight changes.  HEENT: Denies eye pain, eye redness, ear pain, ringing in the ears, wax buildup, runny nose, nasal congestion, bloody nose, or sore throat. Respiratory: Denies difficulty breathing, shortness of breath, cough or sputum production.  Cardiovascular: Denies chest pain, chest tightness, palpitations or swelling in the hands or feet.  Gastrointestinal: Denies abdominal pain, bloating, constipation, diarrhea or blood in the stool.  GU: Denies urgency, frequency, pain with urination, burning sensation, blood in urine, odor or discharge. Musculoskeletal: Denies decrease in range of motion, difficulty with gait, muscle pain or joint pain and swelling.  Skin: Denies redness, rashes, lesions or ulcercations.  Neurological: Denies dizziness, difficulty with memory, difficulty with speech or problems with balance and coordination.  Psych: Denies anxiety, depression, SI/HI.  No other specific complaints in a complete review of systems (except as listed in HPI above).  Objective:   Physical Exam    There were no vitals taken for this visit. Wt Readings from Last 3 Encounters:   01/17/22 191 lb (86.6 kg)  01/14/22 190 lb (86.2 kg)  01/07/22 182 lb 15.7 oz (83 kg)    General: Appears their stated age, well developed, well nourished in NAD. Skin: Warm, dry and intact. No rashes, lesions or ulcerations noted. HEENT: Head: normal shape and size; Eyes: sclera white, no icterus, conjunctiva pink, PERRLA and EOMs intact; Ears: Tm's gray and intact, normal light reflex; Nose: mucosa pink and moist, septum midline; Throat/Mouth: Teeth present, mucosa pink and moist, no exudate, lesions or ulcerations noted.  Neck:  Neck supple, trachea midline. No masses, lumps or thyromegaly present.  Cardiovascular: Normal rate and rhythm. S1,S2 noted.  No murmur, rubs or gallops noted. No JVD or BLE edema. No carotid bruits noted. Pulmonary/Chest: Normal effort and positive vesicular breath sounds. No respiratory distress. No wheezes, rales or ronchi noted.  Abdomen: Soft and nontender. Normal bowel sounds. No distention or masses noted. Liver, spleen and kidneys non palpable. Musculoskeletal: Normal range of motion. No signs of joint swelling. No difficulty with gait.  Neurological: Alert and oriented. Cranial nerves II-XII grossly intact. Coordination normal.  Psychiatric: Mood and affect normal. Behavior is normal. Judgment and thought content normal.    BMET    Component Value Date/Time   NA 137 01/07/2022 0853   NA 138 11/19/2021 1035   K 3.3 (L) 01/07/2022 0853   CL 105 01/07/2022 0853   CO2 25 01/07/2022 0853   GLUCOSE 97 01/07/2022 0853   BUN 9 01/07/2022 0853   BUN 10 11/19/2021 1035   CREATININE 0.75 01/07/2022 0853   CREATININE 0.66 12/05/2020 1154   CALCIUM 8.7 (L) 01/07/2022 0853   GFRNONAA >60 01/07/2022 0853   GFRNONAA 117 08/23/2020 1213   GFRAA 136 08/23/2020 1213    Lipid Panel  No results found for: "CHOL", "TRIG", "HDL", "CHOLHDL", "VLDL", "LDLCALC"  CBC    Component Value Date/Time   WBC 5.9 01/08/2022 0416   RBC 4.48 01/08/2022 0416   HGB 13.0  01/08/2022 0416   HGB 12.1 11/18/2017 1613   HCT 40.1 01/08/2022 0416   HCT 37.3 11/18/2017 1613   PLT 283 01/08/2022 0416   PLT 425 (H) 11/18/2017 1613   MCV 89.5 01/08/2022 0416   MCV 80 11/18/2017 1613   MCH 29.0 01/08/2022 0416   MCHC 32.4 01/08/2022 0416   RDW 12.2 01/08/2022 0416   RDW 16.3 (H) 11/18/2017 1613   LYMPHSABS 2.7 01/07/2022 0853   LYMPHSABS 4.3 (H) 11/18/2017 1613   MONOABS 0.4 01/07/2022 0853   EOSABS 0.1 01/07/2022 0853   EOSABS 0.1 11/18/2017 1613   BASOSABS 0.1 01/07/2022 0853   BASOSABS 0.0 11/18/2017 1613    Hgb A1C Lab Results  Component Value Date   HGBA1C 5.7 (H)  11/19/2021          Assessment & Plan:   Preventative Health Maintenance:  Flu shot today Tetanus Encouraged her to get her COVID booster She no longer needs Pap smears Encouraged her to consume a balanced diet and exercise regimen Advised her to see an eye doctor and dentist annually We will check CBC, c-Met, lipid, A1c, HIV and hep C today  RTC in 6 months, follow-up chronic conditions Webb Silversmith, NP

## 2022-07-24 NOTE — Telephone Encounter (Signed)
Unable to reach pt after three attempts. Forward to office, pt missed appt this morning due to illness, was trying to schedule a MyChart virtual visit for her.

## 2022-07-24 NOTE — Telephone Encounter (Signed)
Second attempt, VM left with call back number.

## 2022-08-12 ENCOUNTER — Ambulatory Visit: Payer: Self-pay | Admitting: *Deleted

## 2022-08-12 ENCOUNTER — Telehealth: Payer: Self-pay

## 2022-08-12 ENCOUNTER — Telehealth (INDEPENDENT_AMBULATORY_CARE_PROVIDER_SITE_OTHER): Payer: Self-pay | Admitting: Physician Assistant

## 2022-08-12 DIAGNOSIS — F418 Other specified anxiety disorders: Secondary | ICD-10-CM

## 2022-08-12 MED ORDER — SERTRALINE HCL 50 MG PO TABS: ORAL_TABLET | ORAL | 0 refills | Status: DC
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
## 2022-08-12 NOTE — Telephone Encounter (Signed)
Copied from [place] [phone]. Topic: Referral - Question >> Aug 12, 2022 12:55 PM Ludger Nutting wrote: April with [facility] called and stated their office is for Neuro Psychology. They don't normally treat anxiety and depression.

## 2022-08-12 NOTE — Telephone Encounter (Signed)
  Chief Complaint: depression Symptoms: sad, hopeless Frequency: constant Pertinent Negatives: Patient denies SI HI Disposition: [] ED /[] Urgent Care (no appt availability in office) / [x] Appointment(In office/virtual)/ []  [facility]/ [] Home Care/ [] Refused Recommended Disposition /[] [place] [facility]/ []  Follow-up with PCP Additional Notes: In office appt scheduled by Agent but pt has to sign into work and wants to change to virtual, message sent to [facility] to see if they can do that. She has Zoloft on current med list but no refills. She has not requested a refill.    Reason for Disposition  [1] Depression AND [2] worsening (e.g., sleeping poorly, less able to do activities of daily living)  Answer Assessment - Initial Assessment Questions 1. CONCERN: "What happened that made you call today?"     Build up coming on 2. DEPRESSION SYMPTOM SCREENING: "How are you feeling overall?" (e.g., decreased energy, increased sleeping or difficulty sleeping, difficulty concentrating, feelings of sadness, guilt, hopelessness, or worthlessness)     Yes, all of the above 3. RISK OF HARM - SUICIDAL IDEATION:  "Do you ever have thoughts of hurting or killing yourself?"  (e.g., yes, no, no but preoccupation with thoughts about death)   - INTENT:  "Do you have thoughts of hurting or killing yourself right NOW?" (e.g., yes, no, N/A)   - PLAN: "Do you have a specific plan for how you would do this?" (e.g., gun, knife, overdose, no plan, N/A)     no 4. RISK OF HARM - HOMICIDAL IDEATION:  "Do you ever have thoughts of hurting or killing someone else?"  (e.g., yes, no, no but preoccupation with thoughts about death)   - INTENT:  "Do you have thoughts of hurting or killing someone right NOW?" (e.g., yes, no, N/A)   - PLAN: "Do you have a specific plan for how you would do this?" (e.g., gun, knife, no plan, N/A)      no  Protocols used: Depression-A-AH

## 2023-02-18 DIAGNOSIS — J029 Acute pharyngitis, unspecified: Secondary | ICD-10-CM | POA: Diagnosis not present

## 2023-02-18 DIAGNOSIS — J309 Allergic rhinitis, unspecified: Secondary | ICD-10-CM | POA: Diagnosis not present

## 2023-02-18 DIAGNOSIS — R059 Cough, unspecified: Secondary | ICD-10-CM | POA: Diagnosis not present

## 2023-03-21 DIAGNOSIS — Z7984 Long term (current) use of oral hypoglycemic drugs: Secondary | ICD-10-CM | POA: Diagnosis not present

## 2023-04-19 DIAGNOSIS — U071 COVID-19: Secondary | ICD-10-CM | POA: Diagnosis not present

## 2023-04-19 DIAGNOSIS — Z113 Encounter for screening for infections with a predominantly sexual mode of transmission: Secondary | ICD-10-CM | POA: Diagnosis not present

## 2023-05-07 ENCOUNTER — Encounter: Payer: Medicaid Other | Admitting: Internal Medicine

## 2023-05-07 NOTE — Progress Notes (Deleted)
Subjective:    Patient ID: Crystal Mendez, female    DOB: 1986-05-03, 37 y.o.   MRN: [id number]  HPI  Patient presents to clinic today for her annual exam.  Flu: 09/2019 Tetanus: COVID: X 2 Pap smear: Hysterectomy Vision screening: Dentist:  Diet: Exercise:  Review of Systems     Past Medical History:  Diagnosis Date   Abnormal Pap smear of cervix 05/01/2016   Anemia    Anxiety    Complication of anesthesia    ITCHING   GERD (gastroesophageal reflux disease)    NO MEDS   Hyperthyroidism    Pre-diabetes     Current Outpatient Medications  Medication Sig Dispense Refill   acetaminophen (TYLENOL) 500 MG tablet Take 2 tablets (1,000 mg total) by mouth 3 (three) times daily. 30 tablet 0   hydrOXYzine (ATARAX) 10 MG tablet Take 1 tablet (10 mg total) by mouth 3 (three) times daily as needed. 30 tablet 0   methocarbamol (ROBAXIN) 500 MG tablet Take 1-2 tablets (500-1,000 mg total) by mouth every 8 (eight) hours as needed for muscle spasms. 60 tablet 0   OZEMPIC, 0.25 OR 0.5 MG/DOSE, 2 MG/3ML SOPN Inject 0.5 mg into the skin once a week. Start with 0.25mg  weekly x 4 weeks then increase to 0.5mg  weekly injection. 9 mL 0   Probiotic Product (PROBIOTIC PO) Take 1 tablet by mouth daily. (Patient not taking: Reported on 08/12/2022)     sertraline (ZOLOFT) 50 MG tablet Take 1 tablet (50 mg total) by mouth daily for 14 days, THEN 2 tablets (100 mg total) daily. Take one tablet by mouth daily for 2 weeks. Then increase to 2 tablets once per day.. 74 tablet 0   No current facility-administered medications for this visit.    Allergies  Allergen Reactions   Bee Venom Swelling   Egg-Derived Products Other (See Comments)    Breakout in sweats   Lactose Nausea And Vomiting    No dairy, cheese, yogurt    Family History  Problem Relation Age of Onset   Ataxia Mother        spinal cerebellar ataxia   Stroke Father        ischemic   Ataxia Sister    Ataxia Brother     Autism Son    Autism Son    Healthy Son    Developmental delay Son     Social History   Socioeconomic History   Marital status: Single    Spouse name: Not on file   Number of children: Not on file   Years of education: Not on file   Highest education level: Not on file  Occupational History   Not on file  Tobacco Use   Smoking status: Never   Smokeless tobacco: Never  Vaping Use   Vaping status: Never Used  Substance and Sexual Activity   Alcohol use: No   Drug use: No   Sexual activity: Yes    Birth control/protection: Surgical    Comment: Hysterectomy  Other Topics Concern   Not on file  Social History Narrative   Not on file   Social Determinants of Health   Financial Resource Strain: Not on file  Food Insecurity: No Food Insecurity (07/11/2020)   Received from [facility] visits prior to 12/21/2022.   Hunger Vital Sign    Worried About Running Out of Food in the Last Year: Never true    Ran Out of Food in the Last Year: Never  true  Transportation Needs: Not on file  Physical Activity: Not on file  Stress: Not on file  Social Connections: Not on file  Intimate Partner Violence: Not on file     Constitutional: Denies fever, malaise, fatigue, headache or abrupt weight changes.  HEENT: Denies eye pain, eye redness, ear pain, ringing in the ears, wax buildup, runny nose, nasal congestion, bloody nose, or sore throat. Respiratory: Denies difficulty breathing, shortness of breath, cough or sputum production.   Cardiovascular: Denies chest pain, chest tightness, palpitations or swelling in the hands or feet.  Gastrointestinal: Denies abdominal pain, bloating, constipation, diarrhea or blood in the stool.  GU: Denies urgency, frequency, pain with urination, burning sensation, blood in urine, odor or discharge. Musculoskeletal: Denies decrease in range of motion, difficulty with gait, muscle pain or joint pain and swelling.  Skin: Denies redness,  rashes, lesions or ulcercations.  Neurological: Denies dizziness, difficulty with memory, difficulty with speech or problems with balance and coordination.  Psych: Patient has a history of anxiety and depression.  Denies  SI/HI.  No other specific complaints in a complete review of systems (except as listed in HPI above).  Objective:   Physical Exam   There were no vitals taken for this visit. Wt Readings from Last 3 Encounters:  01/17/22 191 lb (86.6 kg)  01/14/22 190 lb (86.2 kg)  01/07/22 182 lb 15.7 oz (83 kg)    General: Appears their stated age, well developed, well nourished in NAD. Skin: Warm, dry and intact. No rashes, lesions or ulcerations noted. HEENT: Head: normal shape and size; Eyes: sclera white, no icterus, conjunctiva pink, PERRLA and EOMs intact; Ears: Tm's gray and intact, normal light reflex; Nose: mucosa pink and moist, septum midline; Throat/Mouth: Teeth present, mucosa pink and moist, no exudate, lesions or ulcerations noted.  Neck:  Neck supple, trachea midline. No masses, lumps or thyromegaly present.  Cardiovascular: Normal rate and rhythm. S1,S2 noted.  No murmur, rubs or gallops noted. No JVD or BLE edema. No carotid bruits noted. Pulmonary/Chest: Normal effort and positive vesicular breath sounds. No respiratory distress. No wheezes, rales or ronchi noted.  Abdomen: Soft and nontender. Normal bowel sounds. No distention or masses noted. Liver, spleen and kidneys non palpable. Musculoskeletal: Normal range of motion. No signs of joint swelling. No difficulty with gait.  Neurological: Alert and oriented. Cranial nerves II-XII grossly intact. Coordination normal.  Psychiatric: Mood and affect normal. Behavior is normal. Judgment and thought content normal.    BMET    Component Value Date/Time   NA 137 01/07/2022 0853   NA 138 11/19/2021 1035   K 3.3 (L) 01/07/2022 0853   CL 105 01/07/2022 0853   CO2 25 01/07/2022 0853   GLUCOSE 97 01/07/2022 0853   BUN  9 01/07/2022 0853   BUN 10 11/19/2021 1035   CREATININE 0.75 01/07/2022 0853   CREATININE 0.66 12/05/2020 1154   CALCIUM 8.7 (L) 01/07/2022 0853   GFRNONAA >60 01/07/2022 0853   GFRNONAA 117 08/23/2020 1213   GFRAA 136 08/23/2020 1213    Lipid Panel  No results found for: "CHOL", "TRIG", "HDL", "CHOLHDL", "VLDL", "LDLCALC"  CBC    Component Value Date/Time   WBC 5.9 01/08/2022 0416   RBC 4.48 01/08/2022 0416   HGB 13.0 01/08/2022 0416   HGB 12.1 11/18/2017 1613   HCT 40.1 01/08/2022 0416   HCT 37.3 11/18/2017 1613   PLT 283 01/08/2022 0416   PLT 425 (H) 11/18/2017 1613   MCV 89.5 01/08/2022 0416  MCV 80 11/18/2017 1613   MCH 29.0 01/08/2022 0416   MCHC 32.4 01/08/2022 0416   RDW 12.2 01/08/2022 0416   RDW 16.3 (H) 11/18/2017 1613   LYMPHSABS 2.7 01/07/2022 0853   LYMPHSABS 4.3 (H) 11/18/2017 1613   MONOABS 0.4 01/07/2022 0853   EOSABS 0.1 01/07/2022 0853   EOSABS 0.1 11/18/2017 1613   BASOSABS 0.1 01/07/2022 0853   BASOSABS 0.0 11/18/2017 1613    Hgb A1C Lab Results  Component Value Date   HGBA1C 5.7 (H) 11/19/2021           Assessment & Plan:   Preventative health maintenance:  Encouraged her to get a flu shot in the fall Tetanus Encouraged her to get her COVID booster She no longer needs to screen for cervical cancer Encouraged her to consume a balanced diet and exercise regimen Advised her to see an eye doctor and dentist annually We will check CBC, c-Met, lipid, A1c, HIV and hep C today  RTC in 6 months, follow-up chronic conditions Nicki Reaper, NP

## 2023-12-31 DIAGNOSIS — F331 Major depressive disorder, recurrent, moderate: Secondary | ICD-10-CM | POA: Diagnosis not present

## 2023-12-31 DIAGNOSIS — F840 Autistic disorder: Secondary | ICD-10-CM | POA: Diagnosis not present

## 2023-12-31 DIAGNOSIS — F411 Generalized anxiety disorder: Secondary | ICD-10-CM | POA: Diagnosis not present

## 2024-01-13 ENCOUNTER — Emergency Department

## 2024-01-13 ENCOUNTER — Emergency Department
Admission: EM | Admit: 2024-01-13 | Discharge: 2024-01-13 | Disposition: A | Discharge status: Home / Self Care | Attending: Emergency Medicine | Admitting: Emergency Medicine

## 2024-01-13 ENCOUNTER — Other Ambulatory Visit: Payer: Self-pay

## 2024-01-13 DIAGNOSIS — R7989 Other specified abnormal findings of blood chemistry: Secondary | ICD-10-CM

## 2024-01-13 DIAGNOSIS — E876 Hypokalemia: Secondary | ICD-10-CM | POA: Diagnosis not present

## 2024-01-13 DIAGNOSIS — I803 Phlebitis and thrombophlebitis of lower extremities, unspecified: Secondary | ICD-10-CM | POA: Diagnosis not present

## 2024-01-13 DIAGNOSIS — R5383 Other fatigue: Secondary | ICD-10-CM | POA: Diagnosis not present

## 2024-01-13 DIAGNOSIS — R946 Abnormal results of thyroid function studies: Secondary | ICD-10-CM | POA: Insufficient documentation

## 2024-01-13 DIAGNOSIS — K219 Gastro-esophageal reflux disease without esophagitis: Secondary | ICD-10-CM | POA: Insufficient documentation

## 2024-01-13 DIAGNOSIS — I8001 Phlebitis and thrombophlebitis of superficial vessels of right lower extremity: Secondary | ICD-10-CM | POA: Insufficient documentation

## 2024-01-13 DIAGNOSIS — R110 Nausea: Secondary | ICD-10-CM | POA: Diagnosis present

## 2024-01-13 DIAGNOSIS — M79652 Pain in left thigh: Secondary | ICD-10-CM | POA: Diagnosis not present

## 2024-01-13 LAB — RESP PANEL BY RT-PCR (RSV, FLU A&B, COVID)  RVPGX2
Influenza A by PCR: NEGATIVE
Influenza B by PCR: NEGATIVE
Resp Syncytial Virus by PCR: NEGATIVE
SARS Coronavirus 2 by RT PCR: NEGATIVE

## 2024-01-13 LAB — CBC
HCT: 40.1 % (ref 36.0–46.0)
Hemoglobin: 13.4 g/dL (ref 12.0–15.0)
MCH: 30.3 pg (ref 26.0–34.0)
MCHC: 33.4 g/dL (ref 30.0–36.0)
MCV: 90.7 fL (ref 80.0–100.0)
Platelets: 320 10*3/uL (ref 150–400)
RBC: 4.42 MIL/uL (ref 3.87–5.11)
RDW: 12.2 % (ref 11.5–15.5)
WBC: 5.4 10*3/uL (ref 4.0–10.5)
nRBC: 0 % (ref 0.0–0.2)

## 2024-01-13 LAB — HEPATIC FUNCTION PANEL
ALT: 18 U/L (ref 0–44)
AST: 22 U/L (ref 15–41)
Albumin: 4.7 g/dL (ref 3.5–5.0)
Alkaline Phosphatase: 30 U/L — ABNORMAL LOW (ref 38–126)
Bilirubin, Direct: <0.1 mg/dL (ref 0.0–0.2)
Total Bilirubin: 0.5 mg/dL (ref 0.0–1.2)
Total Protein: 7.9 g/dL (ref 6.5–8.1)

## 2024-01-13 LAB — T4, FREE: Free T4: 0.77 ng/dL (ref 0.61–1.12)

## 2024-01-13 LAB — BASIC METABOLIC PANEL
Anion gap: 10 (ref 5–15)
BUN: 12 mg/dL (ref 6–20)
CO2: 25 mmol/L (ref 22–32)
Calcium: 9.7 mg/dL (ref 8.9–10.3)
Chloride: 105 mmol/L (ref 98–111)
Creatinine, Ser: 0.63 mg/dL (ref 0.44–1.00)
GFR, Estimated: >60 mL/min (ref >60)
Glucose, Bld: 85 mg/dL (ref 70–99)
Potassium: 2.9 mmol/L — ABNORMAL LOW (ref 3.5–5.1)
Sodium: 140 mmol/L (ref 135–145)

## 2024-01-13 LAB — HCG, QUANTITATIVE, PREGNANCY: hCG, Beta Chain, Quant, S: <1 m[IU]/mL (ref <5)

## 2024-01-13 LAB — TROPONIN I (HIGH SENSITIVITY): Troponin I (High Sensitivity): <2 ng/L (ref <18)

## 2024-01-13 LAB — LIPASE, BLOOD: Lipase: 24 U/L (ref 11–51)

## 2024-01-13 LAB — TSH: TSH: 0.228 u[IU]/mL — ABNORMAL LOW (ref 0.350–4.500)

## 2024-01-13 MED ORDER — ALUM & MAG HYDROXIDE-SIMETH 200-200-20 MG/5ML PO SUSP
30.0000 mL | Freq: Once | ORAL | Status: AC
  Administered 2024-01-13: 30 mL via ORAL
  Filled 2024-01-13: qty 30

## 2024-01-13 MED ORDER — FAMOTIDINE 20 MG PO TABS: 20.0000 mg | ORAL_TABLET | Freq: Two times a day (BID) | ORAL | 0 refills | Status: AC

## 2024-01-13 MED ORDER — POTASSIUM CHLORIDE CRYS ER 20 MEQ PO TBCR
40.0000 meq | EXTENDED_RELEASE_TABLET | Freq: Once | ORAL | Status: AC
  Administered 2024-01-13: 40 meq via ORAL
  Filled 2024-01-13: qty 2

## 2024-01-13 MED ORDER — LIDOCAINE VISCOUS HCL 2 % MT SOLN
15.0000 mL | Freq: Once | OROMUCOSAL | Status: AC
  Administered 2024-01-13: 15 mL via ORAL
  Filled 2024-01-13: qty 15

## 2024-01-13 NOTE — ED Notes (Addendum)
 See triage note  Presents with some dizziness and nausea  States this as been going on for a while  But states the smell of food makes her more nauseous today She also has some swelling and tenderness to right upper leg

## 2024-01-13 NOTE — ED Triage Notes (Signed)
 Pt comes with c/o dizziness and nausea. Pt states this started months back. Pt states she now can't eat. Pt states she can't smell good and food makes her nausea. Pt states belly pain upper area.

## 2024-01-13 NOTE — ED Provider Notes (Addendum)
 [facility] Provider Note    Event Date/Time   First MD Initiated Contact with Patient 01/13/24 1456     (approximate)   History   Dizziness   HPI  Crystal Mendez is a 38 y.o. female with history of anxiety, depression, GERD, presenting with months of fatigue, nausea, decreased appetite.  States that she does get panic attacks and sometimes will feel little bit short of breath but no shortness of breath or chest pain right now, no fever or cough, states that she has some mild left upper quadrant abdominal pain.  Had some diarrhea today.  No urinary symptoms.  States that she got a partial hysterectomy in the past, no heavy menstrual bleeding recently.  Also concern about nodules that she feels in her inner thigh that are not new.  No trauma or falls.  States she has not follow-up with the primary care doctor for her symptoms.  On independent chart review she was seen by her primary care provider in 2023 for anxiety and depression, is on sertraline for that.     Physical Exam   Triage Vital Signs: ED Triage Vitals  Encounter Vitals Group     BP 01/13/24 1316 126/84     Systolic BP Percentile --      Diastolic BP Percentile --      Pulse Rate 01/13/24 1316 77     Resp 01/13/24 1316 18     Temp 01/13/24 1316 97.7 F (36.5 C)     Temp src --      SpO2 01/13/24 1316 93 %     Weight 01/13/24 1314 140 lb (63.5 kg)     Height 01/13/24 1314 5\' 6"  (1.676 m)     Head Circumference --      Peak Flow --      Pain Score 01/13/24 1313 7     Pain Loc --      Pain Education --      Exclude from Growth Chart --     Most recent vital signs: Vitals:   01/13/24 1316 01/13/24 1756  BP: 126/84 120/80  Pulse: 77 70  Resp: 18 18  Temp: 97.7 F (36.5 C) 98 F (36.7 C)  SpO2: 93% 97%     General: Awake, no distress.  CV:  Good peripheral perfusion.  Resp:  Normal effort.  No increased work of breathing Abd:  No distention.  Abdomen soft, mildly  tender in the left upper quadrant no right upper quadrant right lower quadrant abdominal pain. Other:  Nontoxic-appearing, no unilateral calf swelling or tenderness, , no palpable lymphadenopathy, there is no overlying lesion, ecchymoses, erythema, fluctuance, induration, able to feel 2-3 small nodules to her inner thigh that are located where her cellulite stretch marks are located on her inner thighs.   ED Results / Procedures / Treatments   Labs (all labs ordered are listed, but only abnormal results are displayed) Labs Reviewed  BASIC METABOLIC PANEL - Abnormal; Notable for the following components:      Result Value   Potassium 2.9 (*)    All other components within normal limits  HEPATIC FUNCTION PANEL - Abnormal; Notable for the following components:   Alkaline Phosphatase 30 (*)    All other components within normal limits  TSH - Abnormal; Notable for the following components:   TSH 0.228 (*)    All other components within normal limits  RESP PANEL BY RT-PCR (RSV, FLU A&B, COVID)  RVPGX2  CBC  LIPASE, BLOOD  HCG, QUANTITATIVE, PREGNANCY  URINALYSIS, W/ REFLEX TO CULTURE (INFECTION SUSPECTED)  T4, FREE  CBG MONITORING, ED  TROPONIN I (HIGH SENSITIVITY)  TROPONIN I (HIGH SENSITIVITY)     EKG  EKG showed normal sinus rhythm, rate 61, normal QRS, normal QTc, T wave inversion in V2, V3, no ischemic ST elevation, T wave changes are new compared to prior   RADIOLOGY Ultrasound showed no obvious DVT on my interpretation.   PROCEDURES:  Critical Care performed: No  Procedures   MEDICATIONS ORDERED IN ED: Medications  potassium chloride SA (KLOR-CON M) CR tablet 40 mEq (40 mEq Oral Given 01/13/24 1523)  alum & mag hydroxide-simeth (MAALOX/MYLANTA) 200-200-20 MG/5ML suspension 30 mL (30 mLs Oral Given 01/13/24 1625)    And  lidocaine (XYLOCAINE) 2 % viscous mouth solution 15 mL (15 mLs Oral Given 01/13/24 1625)     IMPRESSION / MDM / ASSESSMENT AND PLAN / ED COURSE   I reviewed the triage vital signs and the nursing notes.                              Differential diagnosis includes, but is not limited to, electrolyte derangements, GERD, peptic ulcer disease, anemia, thyroid dysfunction, atypical ACS, anxiety, viral illness, for the small nodules in her mid thighs, no evidence of infection, lymphadenopathy, suspect this is connective tissue or fat globules, she would like an ultrasound to check for DVTs, she has no unilateral calf swelling or tenderness, will get an ultrasound, labs, EKG, troponin, viral panel, TSH.  Will give her a GI cocktail.  Emphasized with patient that she needs to follow-up with her primary care doctor, she is agreeable with the plan.  Patient's presentation is most consistent with acute presentation with potential threat to life or bodily function.  Independent review of labs imaging are below.  Had called lab to see if troponin could be added onto labs but they said that it had already been running and they cannot add it again.  On recheck of labs, TSH has not resulted.  Patient requesting to leave, states that her symptoms improved after the GI cocktail.  She does not want to stay for troponin labs results.  But is agreeable to have them drawn and sent before she leaves.  Extensive discussion done about results of labs and imaging and recommended follow-up with her primary care doctor to get further thyroid testing and further management of her other symptoms..  Also discussed her superficial thrombosis in her GSV.  Otherwise she is safe for outpatient management, considered but no indication for inpatient at this time.  Will discharge with strict turn precautions.  7:36 PM: Troponin is not elevated, given that her symptoms have been ongoing for a month, a second troponin is not warranted at this time.  Clinical Course as of 01/13/24 1827  Tue Jan 13, 2024  1731 US Venous Img Lower Bilateral IMPRESSION: 1. No evidence of deep venous  thrombosis in either lower extremity. 2. Right mid thigh GSV superficial thrombosis/thrombophlebitis   [TT]  1758 Independent review of labs, her TSH is low, added free T4.  Respiratory viral panel is negative, hCG, lipase not elevated. [TT]    Clinical Course User Index [TT] Jodie Echevaria Franchot Erichsen, MD     FINAL CLINICAL IMPRESSION(S) / ED DIAGNOSES   Final diagnoses:  Other fatigue  Nausea  Gastroesophageal reflux disease, unspecified whether esophagitis present  Low TSH level  Hypokalemia  Thrombophlebitis of superficial veins of right lower extremity     Rx / DC Orders   ED Discharge Orders          Ordered    famotidine (PEPCID) 20 MG tablet  2 times daily        01/13/24 1808             Note:  This document was prepared using Dragon voice recognition software and may include unintentional dictation errors.    Claybon Jabs, MD 01/13/24 Michaelene Song    Claybon Jabs, MD 01/13/24 Mila Homer    Claybon Jabs, MD 01/13/24 [phone]

## 2024-01-13 NOTE — ED Notes (Signed)
 Pt to front desk stating that she was going to go outside for a few minutes and would be back. Pt was informed that if she left the department she would have to be taken off the board. Pt states that she will return to room and wait on her paper work.

## 2024-01-13 NOTE — Discharge Instructions (Addendum)
 Please follow-up with your primary care doctor for further management of your low TSH levels.  Please take the Pepcid as prescribed for your GERD.  Please return if you have persistent or worsening symptoms.  For the superficial blood clot/thrombophlebitis, you can take ibuprofen and or put warm compresses.  If you start to have worsening leg pain or shortness of breath or chest pain, please return to the emergency department.

## 2024-01-14 DIAGNOSIS — F331 Major depressive disorder, recurrent, moderate: Secondary | ICD-10-CM | POA: Diagnosis not present

## 2024-01-14 DIAGNOSIS — F840 Autistic disorder: Secondary | ICD-10-CM | POA: Diagnosis not present

## 2024-01-14 DIAGNOSIS — F411 Generalized anxiety disorder: Secondary | ICD-10-CM | POA: Diagnosis not present

## 2024-01-16 ENCOUNTER — Ambulatory Visit: Admitting: Internal Medicine

## 2024-01-16 ENCOUNTER — Encounter: Payer: Self-pay | Admitting: Internal Medicine

## 2024-01-16 VITALS — BP 122/78 | HR 89 | Ht 66.0 in | Wt 138.0 lb

## 2024-01-16 DIAGNOSIS — K219 Gastro-esophageal reflux disease without esophagitis: Secondary | ICD-10-CM

## 2024-01-16 DIAGNOSIS — F418 Other specified anxiety disorders: Secondary | ICD-10-CM | POA: Diagnosis not present

## 2024-01-16 DIAGNOSIS — R7989 Other specified abnormal findings of blood chemistry: Secondary | ICD-10-CM | POA: Diagnosis not present

## 2024-01-16 DIAGNOSIS — E876 Hypokalemia: Secondary | ICD-10-CM

## 2024-01-16 DIAGNOSIS — R5383 Other fatigue: Secondary | ICD-10-CM

## 2024-01-16 MED ORDER — SERTRALINE HCL 50 MG PO TABS: 50.0000 mg | ORAL_TABLET | Freq: Every day | ORAL | 0 refills | Status: AC

## 2024-01-16 MED ORDER — OMEPRAZOLE 20 MG PO CPDR
20.0000 mg | DELAYED_RELEASE_CAPSULE | Freq: Every day | ORAL | 0 refills | Status: DC
Start: 2024-01-16 — End: 2024-04-13

## 2024-01-16 MED ORDER — ONDANSETRON 4 MG PO TBDP: 4.0000 mg | ORAL_TABLET | Freq: Three times a day (TID) | ORAL | 0 refills | Status: DC | PRN

## 2024-01-16 NOTE — Assessment & Plan Note (Signed)
 Will restart sertraline 50 mg daily Support offered

## 2024-01-16 NOTE — Patient Instructions (Signed)
 Major Depressive Disorder, Adult Major depressive disorder (MDD) is a mental health condition. People with this disorder feel very sad, hopeless, and lose interest in things. Symptoms last most of the day, almost every day, for 2 weeks. MDD can affect: Relationships. Work and school. Things you usually like to do. What are the causes? The cause of MDD is not known. What increases the risk? Having family members with depression. Being female. Family problems. Alcohol or drug misuse. A lot of stress in your life, such as from: Living without basic needs such as food and housing. Being treated poorly because of race, sex, or religion (discrimination). Things that caused you pain as a child, especially if you lost a parent or were abused. Health and mental problems that you have had for a long time. What are the signs or symptoms? The main symptoms of this condition are: Being sad all the time. Being grouchy (irritable) all the time. Not enjoying the things you usually like. Sleeping too much or too little. Eating too much or too little. Feeling tired. Other symptoms include: Gaining or losing weight, without knowing why. Being restless and weak. Feeling hopeless, worthless, or guilty. Trouble thinking or making decisions. Thoughts of hurting yourself or others, or thoughts of ending your life. Spending a lot of time alone. Being unable to do daily tasks. If you have very bad MDD, you may: Believe things that are not true. Hear, see, taste, or feel things that are not there. Have mild depression that lasts for at least 2 years. Feel very sad and hopeless. Have trouble speaking or moving. Feel very sad during some seasons. How is this treated? Talk therapy. This teaches you about thoughts, feelings, and actions and how to change them. This can also help you to talk with others. This can be done with members of your family. Medicines. Lifestyle changes. You may need to: Limit  alcohol use. Stop using drugs, if you use them. Exercise. Get plenty of sleep. Eat healthy. Spend more time outdoors. Brain stimulation. This may be done when symptoms are very bad or have not gotten better. Follow these instructions at home: Alcohol use Do not drink alcohol if: Your health care provider tells you not to drink. You are pregnant, may be pregnant, or are planning to become pregnant. If you drink alcohol: Limit how much you use to: 0-1 drink a day for women. 0-2 drinks a day for men. Know how much alcohol is in your drink. In the [place] one drink equals one 12 oz bottle of beer (355 mL), one 5 oz glass of wine (148 mL), or one 1 oz glass of hard liquor (44 mL). Activity Exercise as told by your doctor. Spend time outdoors. Make time to do the things you enjoy. Find ways to deal with stress. Try to: Meditate. Do deep breathing. Spend time in nature. Keep a journal. Return to your normal activities when your doctor says that it is safe. General instructions  Take over-the-counter and prescription medicines only as told by your doctor. Talk to your doctor about: Alcohol use. It can affect medicines. Any drug use. Eat healthy foods. Get a lot of sleep. Think about joining a support group. Ask your doctor about that. Keep all follow-up visits. Your doctor will need to check on your mood, behavior, and medicines, and change your treatment as needed. Where to find more information: [employer] on Mental Illness: [employer]ami[employer] of Mental Health: BloggerCourse.com American Psychiatric Association: psychiatry.org Contact a doctor  if: You feel worse. You get new symptoms. Get help right away if: You hurt yourself on purpose (self-harm). You have thoughts about hurting yourself or others. You see, hear, taste, smell, or feel things that are not there. Get help right away if you feel like you may hurt yourself or others, or have thoughts about taking  your own life. Go to your nearest emergency room or: Call 911. Call the National Suicide Prevention Lifeline at (513) 004-4926 or 988. This is open 24 hours a day. Text the Crisis Text Line at (314) 815-6587. This information is not intended to replace advice given to you by your health care provider. Make sure you discuss any questions you have with your health care provider. Document Revised: 02/12/2022 Document Reviewed: 02/12/2022 Elsevier Patient Education  2024 [employer].

## 2024-01-16 NOTE — Progress Notes (Signed)
 Subjective:    Patient ID: Crystal Mendez, female    DOB: June 06, 1986, 38 y.o.   MRN: [id number]  HPI  Patient presents to clinic today for ER follow-up.  She presented to the ER 3/25 with complaint of fatigue, nausea, decreased appetite, abdominal pain and diarrhea.  She also had some concerns about nodules on her inner thigh.  Metabolic panel revealed hypokalemia with a potassium of 2.9.  CBC, c-Met and lipase was normal.  Her TSH was slightly low at 0.228.  Respiratory panel and troponin was negative.  ECG saw new T wave inversion but essentially unchanged from her last ECG.  Ultrasound showed a right mid thigh GSV superficial thrombosis/thrombophlebitis.  Her potassium was replaced.  She was given a GI cocktail with improvement in her abdominal pain and nausea.  Review of Systems   Past Medical History:  Diagnosis Date   Abnormal Pap smear of cervix 05/01/2016   Anemia    Anxiety    Complication of anesthesia    ITCHING   GERD (gastroesophageal reflux disease)    NO MEDS   Hyperthyroidism    Pre-diabetes     Current Outpatient Medications  Medication Sig Dispense Refill   acetaminophen (TYLENOL) 500 MG tablet Take 2 tablets (1,000 mg total) by mouth 3 (three) times daily. 30 tablet 0   famotidine (PEPCID) 20 MG tablet Take 1 tablet (20 mg total) by mouth 2 (two) times daily for 14 days. 28 tablet 0   hydrOXYzine (ATARAX) 10 MG tablet Take 1 tablet (10 mg total) by mouth 3 (three) times daily as needed. 30 tablet 0   methocarbamol (ROBAXIN) 500 MG tablet Take 1-2 tablets (500-1,000 mg total) by mouth every 8 (eight) hours as needed for muscle spasms. 60 tablet 0   OZEMPIC, 0.25 OR 0.5 MG/DOSE, 2 MG/3ML SOPN Inject 0.5 mg into the skin once a week. Start with 0.25mg  weekly x 4 weeks then increase to 0.5mg  weekly injection. 9 mL 0   Probiotic Product (PROBIOTIC PO) Take 1 tablet by mouth daily. (Patient not taking: Reported on 08/12/2022)     sertraline (ZOLOFT) 50 MG tablet  Take 1 tablet (50 mg total) by mouth daily for 14 days, THEN 2 tablets (100 mg total) daily. Take one tablet by mouth daily for 2 weeks. Then increase to 2 tablets once per day.. 74 tablet 0   No current facility-administered medications for this visit.    Allergies  Allergen Reactions   Bee Venom Swelling   Egg-Derived Products Other (See Comments)    Breakout in sweats   Lactose Nausea And Vomiting    No dairy, cheese, yogurt    Family History  Problem Relation Age of Onset   Ataxia Mother        spinal cerebellar ataxia   Stroke Father        ischemic   Ataxia Sister    Ataxia Brother    Autism Son    Autism Son    Healthy Son    Developmental delay Son     Social History   Socioeconomic History   Marital status: Single    Spouse name: Not on file   Number of children: Not on file   Years of education: Not on file   Highest education level: Not on file  Occupational History   Not on file  Tobacco Use   Smoking status: Never   Smokeless tobacco: Never  Vaping Use   Vaping status: Never Used  Substance and  Sexual Activity   Alcohol use: Yes    Comment: occ   Drug use: No   Sexual activity: Yes    Birth control/protection: Surgical    Comment: Hysterectomy  Other Topics Concern   Not on file  Social History Narrative   Not on file   Social Drivers of Health   Financial Resource Strain: Not on file  Food Insecurity: No Food Insecurity (07/11/2020)   Received from [facility] visits prior to 12/21/2022., [facility] visits prior to 12/21/2022.   Hunger Vital Sign    Worried About Running Out of Food in the Last Year: Never true    Ran Out of Food in the Last Year: Never true  Transportation Needs: Not on file  Physical Activity: Not on file  Stress: Not on file  Social Connections: Not on file  Intimate Partner Violence: Not on file     Constitutional: Pt reports fatigue. Denies fever, malaise, headache or  abrupt weight changes.  HEENT: Denies eye pain, eye redness, ear pain, ringing in the ears, wax buildup, runny nose, nasal congestion, bloody nose, or sore throat. Respiratory: Pt reports cough and shortness of breath. Denies difficulty breathing, or sputum production.   Cardiovascular: Pt reports chest tightness, palpitations. Denies chest pain, or swelling in the hands or feet.  Gastrointestinal: Pt reports nausea, vomiting and constipation. Denies abdominal pain, bloating,  diarrhea or blood in the stool.  GU: Denies urgency, frequency, pain with urination, burning sensation, blood in urine, odor or discharge. Musculoskeletal: Pt reports generalized weakness. Denies decrease in range of motion, difficulty with gait, muscle pain or joint pain and swelling.  Skin: Denies redness, rashes, lesions or ulcercations.  Neurological: Denies dizziness, difficulty with memory, difficulty with speech or problems with balance and coordination.  Psych: Pt reports anxiety and depression. Denies SI/HI.  No other specific complaints in a complete review of systems (except as listed in HPI above).      Objective:   Physical Exam  BP 122/78 (BP Location: Right Arm, Patient Position: Sitting, Cuff Size: Normal)   Pulse 89   Ht 5\' 6"  (1.676 m)   Wt 138 lb (62.6 kg)   SpO2 100%   BMI 22.27 kg/m   Wt Readings from Last 3 Encounters:  01/13/24 140 lb (63.5 kg)  01/17/22 191 lb (86.6 kg)  01/14/22 190 lb (86.2 kg)    General: Appears her stated age, well developed, well nourished in NAD. Skin: Warm, dry and intact.  Bruising noted to bilateral antecubital fossa's. HEENT: Head: normal shape and size; Eyes: sclera white, no icterus, conjunctiva pink, PERRLA and EOMs intact;  Neck:  Neck supple, trachea midline. No masses, lumps or thyromegaly present.  Cardiovascular: Normal rate and rhythm. S1,S2 noted.  No murmur, rubs or gallops noted. No JVD or BLE edema.  Pulmonary/Chest: Normal effort and positive  vesicular breath sounds. No respiratory distress. No wheezes, rales or ronchi noted.  Abdomen: Normal bowel sounds.  Musculoskeletal:  No difficulty with gait.  Neurological: Alert and oriented.  Coordination normal.  Psychiatric: Mood and affect flat. Behavior is normal. Judgment and thought content normal.    BMET    Component Value Date/Time   NA 140 01/13/2024 1315   NA 138 11/19/2021 1035   K 2.9 (L) 01/13/2024 1315   CL 105 01/13/2024 1315   CO2 25 01/13/2024 1315   GLUCOSE 85 01/13/2024 1315   BUN 12 01/13/2024 1315   BUN 10 11/19/2021 1035  CREATININE 0.63 01/13/2024 1315   CREATININE 0.66 12/05/2020 1154   CALCIUM 9.7 01/13/2024 1315   GFRNONAA >60 01/13/2024 1315   GFRNONAA 117 08/23/2020 1213   GFRAA 136 08/23/2020 1213    Lipid Panel  No results found for: "CHOL", "TRIG", "HDL", "CHOLHDL", "VLDL", "LDLCALC"  CBC    Component Value Date/Time   WBC 5.4 01/13/2024 1315   RBC 4.42 01/13/2024 1315   HGB 13.4 01/13/2024 1315   HGB 12.1 11/18/2017 1613   HCT 40.1 01/13/2024 1315   HCT 37.3 11/18/2017 1613   PLT 320 01/13/2024 1315   PLT 425 (H) 11/18/2017 1613   MCV 90.7 01/13/2024 1315   MCV 80 11/18/2017 1613   MCH 30.3 01/13/2024 1315   MCHC 33.4 01/13/2024 1315   RDW 12.2 01/13/2024 1315   RDW 16.3 (H) 11/18/2017 1613   LYMPHSABS 2.7 01/07/2022 0853   LYMPHSABS 4.3 (H) 11/18/2017 1613   MONOABS 0.4 01/07/2022 0853   EOSABS 0.1 01/07/2022 0853   EOSABS 0.1 11/18/2017 1613   BASOSABS 0.1 01/07/2022 0853   BASOSABS 0.0 11/18/2017 1613    Hgb A1C Lab Results  Component Value Date   HGBA1C 5.7 (H) 11/19/2021            Assessment & Plan:  ER follow-up for GERD, hypokalemia, abnormal TSH and thrombophlebitis:  ER notes, labs and imaging reviewed We will check [place], TSH, free T4, T3 and TPO antibodies today Rx omeprazole 20 mg daily Rx for Zofran 4 mg ODT every 8 hours as needed for nausea Consider referral to endocrinology for further  evaluation and treatment   Schedule an appointment for your annual exam Nicki Reaper, NP

## 2024-01-19 ENCOUNTER — Encounter: Payer: Self-pay | Admitting: Internal Medicine

## 2024-01-19 DIAGNOSIS — E0590 Thyrotoxicosis, unspecified without thyrotoxic crisis or storm: Secondary | ICD-10-CM

## 2024-01-19 LAB — BASIC METABOLIC PANEL WITH GFR
BUN: 7 mg/dL (ref 7–25)
CO2: 28 mmol/L (ref 20–32)
Calcium: 10 mg/dL (ref 8.6–10.2)
Chloride: 100 mmol/L (ref 98–110)
Creat: 0.55 mg/dL (ref 0.50–0.97)
Glucose, Bld: 66 mg/dL (ref 65–99)
Potassium: 3.3 mmol/L — ABNORMAL LOW (ref 3.5–5.3)
Sodium: 138 mmol/L (ref 135–146)
eGFR: 122 mL/min/{1.73_m2} (ref >=60)

## 2024-01-19 LAB — T4, FREE: Free T4: 1.2 ng/dL (ref 0.8–1.8)

## 2024-01-19 LAB — THYROID PEROXIDASE ANTIBODIES (TPO) (REFL): Thyroperoxidase Ab SerPl-aCnc: 1 [IU]/mL (ref <9)

## 2024-01-19 LAB — T3: T3, Total: 108 ng/dL (ref 76–181)

## 2024-01-19 LAB — TSH: TSH: 0.3 m[IU]/L — ABNORMAL LOW

## 2024-01-19 MED ORDER — POTASSIUM CHLORIDE CRYS ER 10 MEQ PO TBCR: 10.0000 meq | EXTENDED_RELEASE_TABLET | Freq: Every day | ORAL | 0 refills | Status: AC

## 2024-01-19 NOTE — Addendum Note (Signed)
 Addended by: Lorre Munroe on: 01/19/2024 09:12 AM   Modules accepted: Orders

## 2024-01-21 DIAGNOSIS — F411 Generalized anxiety disorder: Secondary | ICD-10-CM | POA: Diagnosis not present

## 2024-01-21 DIAGNOSIS — F331 Major depressive disorder, recurrent, moderate: Secondary | ICD-10-CM | POA: Diagnosis not present

## 2024-01-21 DIAGNOSIS — F840 Autistic disorder: Secondary | ICD-10-CM | POA: Diagnosis not present

## 2024-01-22 ENCOUNTER — Ambulatory Visit

## 2024-01-22 DIAGNOSIS — Z719 Counseling, unspecified: Secondary | ICD-10-CM

## 2024-01-22 DIAGNOSIS — Z23 Encounter for immunization: Secondary | ICD-10-CM | POA: Diagnosis not present

## 2024-01-22 NOTE — Progress Notes (Signed)
 In nurse clinic with sons for Tdap and flu. Tolerated vaccines well. Updated [facility] copy given and explained. Jerel Shepherd, RN

## 2024-01-23 ENCOUNTER — Emergency Department
Admission: EM | Admit: 2024-01-23 | Discharge: 2024-01-24 | Disposition: A | Discharge status: Home / Self Care | Attending: Emergency Medicine | Admitting: Emergency Medicine

## 2024-01-23 ENCOUNTER — Emergency Department

## 2024-01-23 ENCOUNTER — Other Ambulatory Visit: Payer: Self-pay

## 2024-01-23 DIAGNOSIS — E039 Hypothyroidism, unspecified: Secondary | ICD-10-CM | POA: Diagnosis not present

## 2024-01-23 DIAGNOSIS — F419 Anxiety disorder, unspecified: Secondary | ICD-10-CM | POA: Diagnosis not present

## 2024-01-23 DIAGNOSIS — R1084 Generalized abdominal pain: Secondary | ICD-10-CM | POA: Diagnosis not present

## 2024-01-23 DIAGNOSIS — R457 State of emotional shock and stress, unspecified: Secondary | ICD-10-CM | POA: Diagnosis not present

## 2024-01-23 DIAGNOSIS — R110 Nausea: Secondary | ICD-10-CM | POA: Diagnosis not present

## 2024-01-23 DIAGNOSIS — E876 Hypokalemia: Secondary | ICD-10-CM | POA: Insufficient documentation

## 2024-01-23 DIAGNOSIS — R109 Unspecified abdominal pain: Secondary | ICD-10-CM | POA: Diagnosis not present

## 2024-01-23 DIAGNOSIS — R0789 Other chest pain: Secondary | ICD-10-CM | POA: Insufficient documentation

## 2024-01-23 DIAGNOSIS — R079 Chest pain, unspecified: Secondary | ICD-10-CM | POA: Diagnosis not present

## 2024-01-23 NOTE — ED Triage Notes (Signed)
 Patient C/O abdominal pain, chest pain, and anxiety that began a couple of weeks ago.
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
## 2024-01-24 ENCOUNTER — Emergency Department

## 2024-01-24 DIAGNOSIS — F419 Anxiety disorder, unspecified: Secondary | ICD-10-CM | POA: Diagnosis not present

## 2024-01-24 DIAGNOSIS — R109 Unspecified abdominal pain: Secondary | ICD-10-CM | POA: Diagnosis not present

## 2024-01-24 LAB — URINE DRUG SCREEN, QUALITATIVE (ARMC ONLY)
Amphetamines, Ur Screen: NOT DETECTED
Barbiturates, Ur Screen: NOT DETECTED
Benzodiazepine, Ur Scrn: POSITIVE — AB
Cannabinoid 50 Ng, Ur ~~LOC~~: POSITIVE — AB
Cocaine Metabolite,Ur ~~LOC~~: NOT DETECTED
MDMA (Ecstasy)Ur Screen: NOT DETECTED
Methadone Scn, Ur: NOT DETECTED
Opiate, Ur Screen: NOT DETECTED
Phencyclidine (PCP) Ur S: NOT DETECTED
Tricyclic, Ur Screen: NOT DETECTED

## 2024-01-24 LAB — HEPATIC FUNCTION PANEL
ALT: 29 U/L (ref 0–44)
AST: 43 U/L — ABNORMAL HIGH (ref 15–41)
Albumin: 4.5 g/dL (ref 3.5–5.0)
Alkaline Phosphatase: 32 U/L — ABNORMAL LOW (ref 38–126)
Bilirubin, Direct: <0.1 mg/dL (ref 0.0–0.2)
Total Bilirubin: 0.5 mg/dL (ref 0.0–1.2)
Total Protein: 7.6 g/dL (ref 6.5–8.1)

## 2024-01-24 LAB — BASIC METABOLIC PANEL WITH GFR
Anion gap: 10 (ref 5–15)
BUN: 10 mg/dL (ref 6–20)
CO2: 25 mmol/L (ref 22–32)
Calcium: 9.4 mg/dL (ref 8.9–10.3)
Chloride: 104 mmol/L (ref 98–111)
Creatinine, Ser: 0.56 mg/dL (ref 0.44–1.00)
GFR, Estimated: >60 mL/min (ref >60)
Glucose, Bld: 109 mg/dL — ABNORMAL HIGH (ref 70–99)
Potassium: 3 mmol/L — ABNORMAL LOW (ref 3.5–5.1)
Sodium: 139 mmol/L (ref 135–145)

## 2024-01-24 LAB — URINALYSIS, ROUTINE W REFLEX MICROSCOPIC
Bacteria, UA: NONE SEEN
Bilirubin Urine: NEGATIVE
Glucose, UA: NEGATIVE mg/dL
Hgb urine dipstick: NEGATIVE
Ketones, ur: 20 mg/dL — AB
Leukocytes,Ua: NEGATIVE
Nitrite: NEGATIVE
Protein, ur: 30 mg/dL — AB
Specific Gravity, Urine: >1.046 — ABNORMAL HIGH (ref 1.005–1.030)
pH: 5 (ref 5.0–8.0)

## 2024-01-24 LAB — HCG, QUANTITATIVE, PREGNANCY: hCG, Beta Chain, Quant, S: <1 m[IU]/mL (ref <5)

## 2024-01-24 LAB — CBC
HCT: 39.1 % (ref 36.0–46.0)
Hemoglobin: 13.2 g/dL (ref 12.0–15.0)
MCH: 30.4 pg (ref 26.0–34.0)
MCHC: 33.8 g/dL (ref 30.0–36.0)
MCV: 90.1 fL (ref 80.0–100.0)
Platelets: 243 10*3/uL (ref 150–400)
RBC: 4.34 MIL/uL (ref 3.87–5.11)
RDW: 12.1 % (ref 11.5–15.5)
WBC: 4.4 10*3/uL (ref 4.0–10.5)
nRBC: 0 % (ref 0.0–0.2)

## 2024-01-24 LAB — MAGNESIUM: Magnesium: 1.7 mg/dL (ref 1.7–2.4)

## 2024-01-24 LAB — LIPASE, BLOOD: Lipase: 28 U/L (ref 11–51)

## 2024-01-24 LAB — TROPONIN I (HIGH SENSITIVITY)
Troponin I (High Sensitivity): <2 ng/L (ref <18)
Troponin I (High Sensitivity): 3 ng/L (ref <18)

## 2024-01-24 MED ORDER — IOHEXOL 300 MG/ML  SOLN
100.0000 mL | Freq: Once | INTRAMUSCULAR | Status: AC | PRN
  Administered 2024-01-24: 100 mL via INTRAVENOUS

## 2024-01-24 MED ORDER — POTASSIUM CHLORIDE CRYS ER 20 MEQ PO TBCR
40.0000 meq | EXTENDED_RELEASE_TABLET | Freq: Once | ORAL | Status: AC
  Administered 2024-01-24: 40 meq via ORAL
  Filled 2024-01-24: qty 2

## 2024-01-24 MED ORDER — ONDANSETRON HCL 4 MG/2ML IJ SOLN: 4.0000 mg | Freq: Once | INTRAMUSCULAR | Status: DC

## 2024-01-24 MED ORDER — HYDROXYZINE HCL 25 MG PO TABS
50.0000 mg | ORAL_TABLET | Freq: Once | ORAL | Status: AC
  Administered 2024-01-24: 50 mg via ORAL
  Filled 2024-01-24: qty 2

## 2024-01-24 MED ORDER — SODIUM CHLORIDE 0.9 % IV BOLUS (SEPSIS)
1000.0000 mL | Freq: Once | INTRAVENOUS | Status: AC
  Administered 2024-01-24: 1000 mL via INTRAVENOUS

## 2024-01-24 MED ORDER — LORAZEPAM 2 MG/ML IJ SOLN
1.0000 mg | Freq: Once | INTRAMUSCULAR | Status: AC
  Administered 2024-01-24: 1 mg via INTRAVENOUS
  Filled 2024-01-24: qty 1

## 2024-01-24 MED ORDER — ONDANSETRON HCL 4 MG/2ML IJ SOLN
4.0000 mg | Freq: Once | INTRAMUSCULAR | Status: AC
  Administered 2024-01-24: 4 mg via INTRAVENOUS
  Filled 2024-01-24: qty 2

## 2024-01-24 MED ORDER — ONDANSETRON 4 MG PO TBDP
4.0000 mg | ORAL_TABLET | Freq: Once | ORAL | Status: AC
  Administered 2024-01-24: 4 mg via ORAL
  Filled 2024-01-24: qty 1

## 2024-01-24 MED ORDER — KETOROLAC TROMETHAMINE 30 MG/ML IJ SOLN
30.0000 mg | Freq: Once | INTRAMUSCULAR | Status: AC
  Administered 2024-01-24: 30 mg via INTRAVENOUS
  Filled 2024-01-24: qty 1

## 2024-01-24 NOTE — ED Provider Notes (Signed)
 Patient is requesting to be discharged home I did get a message from psychiatry that patient was cleared for discharge home without any changes to medications and follow-up with psychiatry.    Concha Se, MD 01/24/24 [phone]

## 2024-01-24 NOTE — ED Notes (Signed)
 Breakfast tray provided for pt.

## 2024-01-24 NOTE — ED Notes (Signed)
 This RN assisted patient to use the toilet. Patient had an even and steady gait. Patient urinated, providing a urine sample, and returned to bed.

## 2024-01-24 NOTE — Consult Note (Signed)
 [facility] Psychiatric Consult Initial  Patient Name: .Crystal Mendez  MRN: [id number]  DOB: 10/04/1986  Consult Order details:  Orders (From admission, onward)     Start     Ordered   01/24/24 0013  CONSULT TO CALL ACT TEAM       Ordering Provider: Raelyn Number, DO  Provider:  (Not yet assigned)  Question:  Reason for Consult?  Answer:  Psych consult   01/24/24 0012   01/24/24 0013  IP CONSULT TO PSYCHIATRY       Ordering Provider: Raelyn Number, DO  Provider:  (Not yet assigned)  Question Answer Comment  Consult Timeframe STAT - requires a response within one hour   STAT timeframe requires provider to provider communication, has the provider to provider communication been completed Yes   Reason for Consult? severe anxiety, possible SI   Contact phone number where the requesting provider can be reached [phone]      01/24/24 0012             Mode of Visit: Tele-visit Virtual Statement:TELE PSYCHIATRY ATTESTATION & CONSENT As the provider for this telehealth consult, I attest that I verified the patient's identity using two separate identifiers, introduced myself to the patient, provided my credentials, disclosed my location, and performed this encounter via a HIPAA-compliant, real-time, face-to-face, two-way, interactive audio and video platform and with the full consent and agreement of the patient (or guardian as applicable.) Patient physical location: [facility]. Telehealth provider physical location: home office in state of [place].   Video start time:   Video end time:      Psychiatry Consult Evaluation  Service Date: January 24, 2024 LOS:  LOS: 0 days  Chief Complaint I had a panic attack  Primary Psychiatric Diagnoses  Anxiety  Assessment  Crystal Mendez is a 38 y.o. female presented to [facility] emergency department on 01/23/2024 at 11:18 PM due to anxiety.  Patient reports having a panic attack in her to visit the emergency department for medication management of  anxiety.  Patient was able to receive an antianxiety agent during her ED stay here and presents calm and pleasant and willing to engage.  She is noted lying in bed, clear and coherent in thought and speech and alert and oriented x 4.  Patient reports that she has a psychiatric history of anxiety of which she has accommodations at work.  She reports that her goal was to get medication management for anxiety and discharged home.  Patient also reports a history of depression but reports a good mood today.  She reports being established with a outpatient psychiatric provider, Molly Maduro at the [facility], as well as a therapist, Irving [facility].  She reports being prescribed sertraline 50 mg daily and reports medication noncompliance for the past year, up until recently.  Patient reports that she started retaking her sertraline 50 mg recently again.  UDS positive for cannabinoid and benzodiazepine.  Patient denies SI, HI, AVH, paranoia or delusional thought.  Patient denies a history of suicide attempt.  Patient denies a history of psychiatric hospitalizations.   Diagnoses:  Active Hospital problems: Active Problems:   Anxiety    Plan   ## Psychiatric Medication Recommendations:  Continue current medication regimen and follow up with outpatient psychiatric provider for medication management.  ## Medical Decision Making Capacity: Not specifically addressed in this encounter  ## Further Work-up:  -- Defer to ED P EKG -- most recent  EKG on 01/24/2024 had QtC of 416 -- Pertinent labwork reviewed earlier this admission includes: CBC, CMP, UDS, Tylenol and salicylate level, ethyl alcohol   ## Disposition:-- There are no psychiatric contraindications to discharge at this time  ## Behavioral / Environmental: - No specific recommendations at this time.     ## Safety and Observation Level:  - Based on my clinical evaluation, I estimate the patient to be at  low risk of self harm in the current setting. - At this time, we recommend  routine. This decision is based on my review of the chart including patient's history and current presentation, interview of the patient, mental status examination, and consideration of suicide risk including evaluating suicidal ideation, plan, intent, suicidal or self-harm behaviors, risk factors, and protective factors. This judgment is based on our ability to directly address suicide risk, implement suicide prevention strategies, and develop a safety plan while the patient is in the clinical setting. Please contact our team if there is a concern that risk level has changed.  CSSR Risk Category:C-SSRS RISK CATEGORY: No Risk  Suicide Risk Assessment: Patient has following modifiable risk factors for suicide: medication noncompliance, which we are addressing by encouraged patient to adhere to medication regimen prescribed by her outpatient psychiatric provider and to follow up with her outpatient psychiatric provider for continued medication management and therapy.  Patient reports that her last therapy appointment was this past Wednesday.  Patient reports that her last outpatient psychiatric appointment was in January 2025. Patient has following non-modifiable or demographic risk factors for suicide: None Patient has the following protective factors against suicide: Access to outpatient mental health care, Supportive family, Supportive friends, Cultural, spiritual, or religious beliefs that discourage suicide, no history of suicide attempts, and no history of NSSIB  Thank you for this consult request. Recommendations have been communicated to the primary team.  We will psychiatrically clear patient at this time.   Mcneil Sober, NP       History of Present Illness  Relevant Aspects of Hospital ED Course:  Admitted on 01/23/2024 for anxiety.   Patient Report:  I had a panic attack  Psych ROS:  Depression: History of  depression.  Patient denies current depression symptoms Anxiety: Yes.  Patient reports panic attack as a reason for ED admission Mania (lifetime and current): Denies Psychosis: (lifetime and current): Denies    Review of Systems  Psychiatric/Behavioral:  Negative for depression, hallucinations, memory loss, substance abuse and suicidal ideas. The patient is nervous/anxious.   All other systems reviewed and are negative.    Psychiatric and Social History  Psychiatric History:  Information collected from patient and ED treatment team  Prev Dx/Sx: Depression and anxiety Current Psych Provider: Yes see above Home Meds (current): Zoloft 50 mg daily Previous Med Trials: Zoloft Therapy: Yes see above  Prior Psych Hospitalization: Denies Prior Self Harm: Denies Prior Violence: Denies  Family Psych History: Denies Family Hx suicide: Denies  Social History:  Developmental Hx: Normal  Educational Hx: High school Occupational Hx: Employed Legal Hx: Denies Living Situation: Lives with children.  Patient reports having 3 sons, 72, 25 and 71 years old Spiritual Hx: God Access to weapons/lethal means: No known  Substance History Alcohol: Denies  Type of alcohol n/a Last Drink n/a Number of drinks per day n/a History of alcohol withdrawal seizures denies History of DT's  denies Tobacco: Denied  Illicit drugs: UDS positive for cannabinoid Prescription drug abuse: Denies Rehab hx:  denies  Exam Findings  Physical  Exam: No abnormalities observed Vital Signs:  Temp:  [98.4 F (36.9 C)-99 F (37.2 C)] 99 F (37.2 C) (04/05 1416) Pulse Rate:  [71-78] 71 (04/05 1416) Resp:  [11-16] 16 (04/05 1416) BP: (96-127)/(57-78) 105/69 (04/05 1423) SpO2:  [99 %-100 %] 99 % (04/05 1416) Weight:  [62.1 kg] 62.1 kg (04/04 2323) Blood pressure 105/69, pulse 71, temperature 99 F (37.2 C), temperature source Oral, resp. rate 16, height 5\' 6"  (1.676 m), weight 62.1 kg, SpO2 99%. Body mass index  is 22.11 kg/m.  Physical Exam Vitals and nursing note reviewed.  Constitutional:      Appearance: She is well-developed.  Neurological:     General: No focal deficit present.     Mental Status: She is oriented to person, place, and time.  Psychiatric:        Mood and Affect: Mood normal.        Behavior: Behavior normal.     Mental Status Exam: General Appearance: Fairly Groomed  Orientation:  Full (Time, Place, and Person)  Memory:  Immediate;   Good Recent;   Good Remote;   Good  Concentration: Good  Recall: Good  Attention good  Eye Contact: Good  Speech: Clear and coherent  Language:  Good  Volume:  Normal  Mood: "good"  Affect:  Congruent  Thought Process:  Coherent  Thought Content:  Logical  Suicidal Thoughts:  No  Homicidal Thoughts:  No  Judgement:  Good  Insight:  Good  Psychomotor Activity:  Normal  Akathisia:  No  Fund of Knowledge:  Good      Assets:  Communication Skills Desire for Improvement Financial Resources/Insurance Housing Intimacy Social Support  Cognition:  WNL  ADL's:  Intact  AIMS (if indicated):        Other History   These have been pulled in through the EMR, reviewed, and updated if appropriate.  Family History:  The patient's family history includes Ataxia in her brother, mother, and sister; Autism in her son and son; Developmental delay in her son; Healthy in her son; Stroke in her father.  Medical History: Past Medical History:  Diagnosis Date   Abnormal Pap smear of cervix 05/01/2016   Anemia    Anxiety    Complication of anesthesia    ITCHING   GERD (gastroesophageal reflux disease)    NO MEDS   Hyperthyroidism    Pre-diabetes     Surgical History: Past Surgical History:  Procedure Laterality Date   CESAREAN SECTION     X 4   CHOLECYSTECTOMY     COLPOSCOPY  06/18/2016   CYSTOSCOPY N/A 04/15/2017   Procedure: CYSTOSCOPY;  Surgeon: Nadara Mustard, MD;  Location: [facility] ORS;  Service: Gynecology;   Laterality: N/A;   LAPAROSCOPIC HYSTERECTOMY N/A 04/15/2017   Ovaries remain - Procedure: HYSTERECTOMY TOTAL LAPAROSCOPIC;  Surgeon: Nadara Mustard, MD;  Location: [facility] ORS;  Service: Gynecology;  Laterality: N/A;   PANNICULECTOMY     ROUX-EN-Y GASTRIC BYPASS     TONSILLECTOMY     TUBAL LIGATION     TUBAL LIGATION     UPPER GASTROINTESTINAL ENDOSCOPY       Medications:  No current facility-administered medications for this encounter.  Current Outpatient Medications:    acetaminophen (TYLENOL) 500 MG tablet, Take 2 tablets (1,000 mg total) by mouth 3 (three) times daily., Disp: 30 tablet, Rfl: 0   famotidine (PEPCID) 20 MG tablet, Take 1 tablet (20 mg total) by mouth 2 (two) times daily for 14 days., Disp:  28 tablet, Rfl: 0   omeprazole (PRILOSEC) 20 MG capsule, Take 1 capsule (20 mg total) by mouth daily., Disp: 90 capsule, Rfl: 0   ondansetron (ZOFRAN-ODT) 4 MG disintegrating tablet, Take 1 tablet (4 mg total) by mouth every 8 (eight) hours as needed for nausea or vomiting., Disp: 20 tablet, Rfl: 0   potassium chloride (KLOR-CON M) 10 MEQ tablet, Take 1 tablet (10 mEq total) by mouth daily., Disp: 90 tablet, Rfl: 0   sertraline (ZOLOFT) 50 MG tablet, Take 1 tablet (50 mg total) by mouth daily., Disp: 90 tablet, Rfl: 0  Allergies: Allergies  Allergen Reactions   Bee Venom Swelling   Egg-Derived Products Other (See Comments)    Breakout in sweats   Lactose Nausea And Vomiting    No dairy, cheese, yogurt    Mcneil Sober, NP

## 2024-01-24 NOTE — ED Notes (Signed)
vol

## 2024-01-24 NOTE — ED Notes (Addendum)
 Patient given an ice water and diet ginger ale. Patient also using a patient phone at this time to call her significant other.

## 2024-01-24 NOTE — ED Notes (Signed)
 Patient expressing feelings of anxiety to this RN. This RN asked patient what I could do to help. Patient states she would like to take some medications. Patient visibly in distress using pursed lip breathing and sitting up tall in bed. This RN notified MD Fuller Plan. See new orders.
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
## 2024-01-24 NOTE — ED Notes (Addendum)
 2 visitors at bedside with patient relations at this time for a 15 minute visit.

## 2024-01-24 NOTE — Discharge Instructions (Addendum)
 You are cleared by psychiatry for discharge home.  Follow-up outpatient with your psychiatry providers and return to the ER if you develop worsening symptoms or any other concerns

## 2024-01-24 NOTE — BH Assessment (Signed)
 Iris consult requested by TTS via telephone and secure chat at 02:23.

## 2024-01-24 NOTE — ED Notes (Signed)
 Patient given lunch tray and a diet ginger ale.

## 2024-01-26 DIAGNOSIS — F840 Autistic disorder: Secondary | ICD-10-CM | POA: Diagnosis not present

## 2024-01-26 DIAGNOSIS — F411 Generalized anxiety disorder: Secondary | ICD-10-CM | POA: Diagnosis not present

## 2024-01-26 DIAGNOSIS — F331 Major depressive disorder, recurrent, moderate: Secondary | ICD-10-CM | POA: Diagnosis not present

## 2024-01-28 DIAGNOSIS — F331 Major depressive disorder, recurrent, moderate: Secondary | ICD-10-CM | POA: Diagnosis not present

## 2024-01-28 DIAGNOSIS — F840 Autistic disorder: Secondary | ICD-10-CM | POA: Diagnosis not present

## 2024-01-28 DIAGNOSIS — F411 Generalized anxiety disorder: Secondary | ICD-10-CM | POA: Diagnosis not present

## 2024-01-29 ENCOUNTER — Telehealth: Payer: Self-pay

## 2024-01-29 NOTE — Telephone Encounter (Signed)
 Copied from CRM [id number]. Topic: Referral - Question >> Jan 29, 2024  8:35 AM Geroge Baseman wrote: Reason for CRM: Patient is wanting to go to another endocrinologist, she wants one in [place] and she wants to get in somewhere ASAP. She does not want to go to the office she was referred to. Please advise.

## 2024-01-29 NOTE — Telephone Encounter (Signed)
 I need more information.  Why does she not want to go to the office she is currently at?  With this being a referral for a second opinion, it is likely that we cannot get her in for a few months

## 2024-01-30 ENCOUNTER — Telehealth: Admitting: Internal Medicine

## 2024-01-30 ENCOUNTER — Encounter: Payer: Self-pay | Admitting: Internal Medicine

## 2024-01-30 ENCOUNTER — Ambulatory Visit: Payer: Self-pay

## 2024-01-30 DIAGNOSIS — R112 Nausea with vomiting, unspecified: Secondary | ICD-10-CM | POA: Diagnosis not present

## 2024-01-30 DIAGNOSIS — G4701 Insomnia due to medical condition: Secondary | ICD-10-CM | POA: Diagnosis not present

## 2024-01-30 DIAGNOSIS — E0590 Thyrotoxicosis, unspecified without thyrotoxic crisis or storm: Secondary | ICD-10-CM | POA: Insufficient documentation

## 2024-01-30 DIAGNOSIS — R634 Abnormal weight loss: Secondary | ICD-10-CM | POA: Diagnosis not present

## 2024-01-30 DIAGNOSIS — N898 Other specified noninflammatory disorders of vagina: Secondary | ICD-10-CM | POA: Diagnosis not present

## 2024-01-30 MED ORDER — FLUCONAZOLE 150 MG PO TABS: 150.0000 mg | ORAL_TABLET | Freq: Once | ORAL | 0 refills | Status: AC

## 2024-01-30 MED ORDER — MIRTAZAPINE 15 MG PO TABS: ORAL_TABLET | ORAL | 0 refills | Status: DC

## 2024-01-30 MED ORDER — ONDANSETRON 4 MG PO TBDP: 4.0000 mg | ORAL_TABLET | Freq: Three times a day (TID) | ORAL | 0 refills | Status: DC | PRN

## 2024-01-30 NOTE — Patient Instructions (Signed)
 Hyperthyroidism Hyperthyroidism refers to a thyroid gland that is too active or overactive. The thyroid gland is a small gland located in the lower front part of the neck, just in front of the windpipe (trachea). This gland makes hormones that: Help control how the body uses food for energy (metabolism). Help the heart and brain work well. Keep your bones strong. When the thyroid is overactive, it produces too much of a hormone called thyroxine. What are the causes? This condition may be caused by: Graves' disease. This is a disorder in which the body's disease-fighting system (immune system) attacks the thyroid gland. This is the most common cause. Inflammation of the thyroid gland. A tumor in the thyroid gland. Use of certain medicines, including: Prescription thyroid hormone replacement. Herbal supplements that mimic thyroid hormones. Amiodarone therapy. Solid or fluid-filled lumps within your thyroid gland (thyroid nodules). Taking in a large amount of iodine from foods or medicines. What increases the risk? You are more likely to develop this condition if: You are female. You have a family history of thyroid conditions. You smoke tobacco. You use a medicine called lithium. You take medicines that affect the immune system (immunosuppressants). What are the signs or symptoms? Symptoms of this condition include: Nervousness. Inability to tolerate heat. Diarrhea. Rapid heart rate. Shaky hands. Restlessness. Sleep problems. Other symptoms may include: Heart skipping beats or making extra beats. Unexplained weight loss. Change in the texture of hair or skin. Loss of menstruation. Fatigue. Enlarged thyroid gland or a lump in the thyroid (nodule). You may also have symptoms of Graves' disease, which may include: Protruding eyes. Dry eyes. Red or swollen eyes. Problems with vision. How is this diagnosed? This condition may be diagnosed based on: Your symptoms and medical  history. A physical exam. Blood tests. Thyroid ultrasound. This test involves using sound waves to produce images of the thyroid gland. A thyroid scan. A radioactive substance is injected into a vein, and images show how much iodine is present in the thyroid. Radioactive iodine uptake test (RAIU). A small amount of radioactive iodine is given by mouth to see how much iodine the thyroid absorbs after a certain amount of time. How is this treated? Treatment depends on the cause and severity of the condition. Treatment may include: Medicines to reduce the amount of thyroid hormone your body makes. Medicines to help manage your symptoms. Radioactive iodine treatment (radioiodine therapy). This involves swallowing a small dose of radioactive iodine, in capsule or liquid form, to kill thyroid cells. Surgery to remove part or all of your thyroid gland. You may need to take thyroid hormone replacement medicine for the rest of your life after thyroid surgery. Follow these instructions at home:  Take over-the-counter and prescription medicines only as told by your health care provider. Do not use any products that contain nicotine or tobacco. These products include cigarettes, chewing tobacco, and vaping devices, such as e-cigarettes. If you need help quitting, ask your health care provider. Follow any instructions from your health care provider about diet. You may be instructed to limit foods that contain iodine. Keep all follow-up visits. You will need to have blood tests regularly so that your health care provider can monitor your condition. Where to find more information [employer] of Diabetes and Digestive and Kidney Diseases: [email or web address] Contact a health care provider if: Your symptoms do not get better with treatment. You have a fever. You have abdominal pain. You feel dizzy. You are taking thyroid hormone replacement medicine and: You have  symptoms of depression. You feel like you  are tired all the time. You gain weight. Get help right away if: You have sudden, unexplained confusion or other mental changes. You have chest pain. You have fast or irregular heartbeats (palpitations). You have difficulty breathing. These symptoms may be an emergency. Get help right away. Call 911. Do not wait to see if the symptoms will go away. Do not drive yourself to the hospital. Summary The thyroid gland is a small gland located in the lower front part of the neck, just in front of the windpipe. Hyperthyroidism is when the thyroid gland is too active and produces too much of a hormone called thyroxine. The most common cause is Graves' disease, a disorder in which your immune system attacks the thyroid gland. Hyperthyroidism can cause various symptoms, such as unexplained weight loss, nervousness, inability to tolerate heat, or changes in your heartbeat. Treatment may include medicine to reduce the amount of thyroid hormone your body makes, radioiodine therapy, surgery, or medicines to manage symptoms. This information is not intended to replace advice given to you by your health care provider. Make sure you discuss any questions you have with your health care provider. Document Revised: 11/30/2021 Document Reviewed: 11/30/2021 Elsevier Patient Education  2024 [employer].

## 2024-01-30 NOTE — Telephone Encounter (Signed)
 Copied from CRM [id number]. Topic: Clinical - Medical Advice >> Jan 30, 2024 11:24 AM Beacher May wrote: Reason for CRM: Nausea and vomitting and pain warm trasnfer to nurse.  Chief Complaint: Vomiting Symptoms: Nausea, weakness, dizziness, lack of appetite Frequency: Chronic, worsening Pertinent Negatives: Patient denies diarrhea Disposition: [] ED /[] Urgent Care (no appt availability in office) / [x] Appointment(In office/virtual)/ []  [facility]/ [] Home Care/ [] Refused Recommended Disposition /[] [facility]/ []  Follow-up with PCP Additional Notes: Patient called in to report nausea and vomiting. Patient stated she has thrown-up 3 times in the past 24 hours. Patient stated she has not been able to eat and is feeling weak and dizzy. Patient stated she has been struggling with chronic health problems. Patient was tearful on the phone with this RN and stated she feels like she is not getting the help she needs in a timely manner. Patient stated she does not have a car and is unable to make it to an office to see a provider. Scheduled same day virtual appointment with PCP. Patient stated she would be able to make it to the appointment on time (scheduled to immediately follow this call). Provided care advice and instructed patient to call back if symptoms worsen. Patient complied.   Reason for Disposition  Vomiting is a chronic symptom (recurrent or ongoing AND present > 4 weeks)  Answer Assessment - Initial Assessment Questions 1. VOMITING SEVERITY: "How many times have you vomited in the past 24 hours?"     - MILD:  1 - 2 times/day    - MODERATE: 3 - 5 times/day, decreased oral intake without significant weight loss or symptoms of dehydration    - SEVERE: 6 or more times/day, vomits everything or nearly everything, with significant weight loss, symptoms of dehydration      3 times in past 24 hours 2. ONSET: "When did the vomiting begin?"      States this has been going on  awhile 3. FLUIDS: "What fluids or food have you vomited up today?" "Have you been able to keep any fluids down?"     States she can keep down "a little" bit of fluid without feeling nauseous 4. ABDOMEN PAIN: "Are your having any abdomen pain?" If Yes : "How bad is it and what does it feel like?" (e.g., crampy, dull, intermittent, constant)      Denies at this time 5. DIARRHEA: "Is there any diarrhea?" If Yes, ask: "How many times today?"      Denies 6. CONTACTS: "Is there anyone else in the family with the same symptoms?"      N/A 7. CAUSE: "What do you think is causing your vomiting?"     ... 8. HYDRATION STATUS: "Any signs of dehydration?" (e.g., dry mouth [not only dry lips], too weak to stand) "When did you last urinate?"     States she is experiencing weakness and dizziness 9. OTHER SYMPTOMS: "Do you have any other symptoms?" (e.g., fever, headache, vertigo, vomiting blood or coffee grounds, recent head injury)     Fatigue, difficulty sleeping, lack of appetite  Protocols used: Vomiting-A-AH

## 2024-01-30 NOTE — Telephone Encounter (Signed)
Will discuss at upcoming appointment today 

## 2024-01-30 NOTE — Progress Notes (Signed)
 Virtual Visit via Video Note  I connected with Crystal Mendez on 01/30/24 at 11:40 AM EDT by a video enabled telemedicine application and verified that I am speaking with the correct person using two identifiers.  Location: Patient: Home Provider: Office  Person's participating in this video call: Nicki Reaper, NP-C and Gabriel Rainwater   I discussed the limitations of evaluation and management by telemedicine and the availability of in person appointments. The patient expressed understanding and agreed to proceed.  History of Present Illness:   Discussed the use of AI scribe software for clinical note transcription with the patient, who gave verbal consent to proceed.   Crystal Mendez is a 38 year old female who presents with weight loss, nausea, and insomnia. She has been referred to endocrinology for suspected hyperthyroidism.  She has experienced significant weight loss, dropping from 138 pounds to 135 pounds, which is beyond her goal of reaching a healthy weight. She continues to lose weight and is concerned about this trend. Nausea, particularly triggered by the smell of food, has contributed to her inability to eat properly. She experiences insomnia, stating that she 'can't sleep.' These symptoms have been persistent and are impacting her daily life.  She is currently taking Zofran 4 mg for nausea, which she takes every 8 hours, but she has run out and requires a refill. She also inquires about her A1c levels, which have not been checked since January 2023, when it was 5.7.  Additionally, she reports symptoms suggestive of a yeast infection or bacterial vaginosis, including irritation and itchiness, but denies any odor. She does not have a vehicle, which complicates her ability to come in for a swab test.       Past Medical History:  Diagnosis Date   Abnormal Pap smear of cervix 05/01/2016   Anemia    Anxiety    Complication of anesthesia    ITCHING   GERD  (gastroesophageal reflux disease)    NO MEDS   Hyperthyroidism    Pre-diabetes     Current Outpatient Medications  Medication Sig Dispense Refill   acetaminophen (TYLENOL) 500 MG tablet Take 2 tablets (1,000 mg total) by mouth 3 (three) times daily. 30 tablet 0   famotidine (PEPCID) 20 MG tablet Take 1 tablet (20 mg total) by mouth 2 (two) times daily for 14 days. 28 tablet 0   omeprazole (PRILOSEC) 20 MG capsule Take 1 capsule (20 mg total) by mouth daily. 90 capsule 0   ondansetron (ZOFRAN-ODT) 4 MG disintegrating tablet Take 1 tablet (4 mg total) by mouth every 8 (eight) hours as needed for nausea or vomiting. 20 tablet 0   potassium chloride (KLOR-CON M) 10 MEQ tablet Take 1 tablet (10 mEq total) by mouth daily. 90 tablet 0   sertraline (ZOLOFT) 50 MG tablet Take 1 tablet (50 mg total) by mouth daily. 90 tablet 0   No current facility-administered medications for this visit.    Allergies  Allergen Reactions   Bee Venom Swelling   Egg-Derived Products Other (See Comments)    Breakout in sweats   Lactose Nausea And Vomiting    No dairy, cheese, yogurt    Family History  Problem Relation Age of Onset   Ataxia Mother        spinal cerebellar ataxia   Stroke Father        ischemic   Ataxia Sister    Ataxia Brother    Autism Son    Autism Son    Healthy  Son    Developmental delay Son     Social History   Socioeconomic History   Marital status: Single    Spouse name: Not on file   Number of children: Not on file   Years of education: Not on file   Highest education level: Not on file  Occupational History   Not on file  Tobacco Use   Smoking status: Never   Smokeless tobacco: Never  Vaping Use   Vaping status: Never Used  Substance and Sexual Activity   Alcohol use: Yes    Comment: occ   Drug use: No   Sexual activity: Yes    Birth control/protection: Surgical    Comment: Hysterectomy  Other Topics Concern   Not on file  Social History Narrative   Not on  file   Social Drivers of Health   Financial Resource Strain: Not on file  Food Insecurity: No Food Insecurity (07/11/2020)   Received from [facility] visits prior to 12/21/2022., [facility] visits prior to 12/21/2022.   Hunger Vital Sign    Worried About Running Out of Food in the Last Year: Never true    Ran Out of Food in the Last Year: Never true  Transportation Needs: Not on file  Physical Activity: Not on file  Stress: Not on file  Social Connections: Not on file  Intimate Partner Violence: Not on file     Constitutional: Pt reports unintentional weight loss. Denies fever, malaise, fatigue, headache.  HEENT: Denies eye pain, eye redness, ear pain, ringing in the ears, wax buildup, runny nose, nasal congestion, bloody nose, or sore throat. Respiratory: Denies difficulty breathing, shortness of breath, cough or sputum production.   Cardiovascular: Denies chest pain, chest tightness, palpitations or swelling in the hands or feet.  Gastrointestinal: Pt reports nausea. Denies abdominal pain, bloating, constipation, diarrhea or blood in the stool.  GU: Pt reports vaginal itching. Denies urgency, frequency, pain with urination, burning sensation, blood in urine, odor or discharge. Musculoskeletal: Denies decrease in range of motion, difficulty with gait, muscle pain or joint pain and swelling.  Skin: Denies redness, rashes, lesions or ulcercations.  Neurological: Pt reports insomnia. Denies dizziness, difficulty with memory, difficulty with speech or problems with balance and coordination.  Psych: Denies anxiety, depression, SI/HI.  No other specific complaints in a complete review of systems (except as listed in HPI above).  Observations/Objective:   Wt Readings from Last 3 Encounters:  01/23/24 137 lb (62.1 kg)  01/16/24 138 lb (62.6 kg)  01/13/24 140 lb (63.5 kg)    General: Appears her stated age, well developed, well nourished in  NAD. Pulmonary/Chest: Normal effort. No respiratory distress.  Neurological: Alert and oriented.  Psychiatric: Mood and affect normal. Anxious appearing. Judgment and thought content normal.    BMET    Component Value Date/Time   NA 139 01/23/2024 2326   NA 138 11/19/2021 1035   K 3.0 (L) 01/23/2024 2326   CL 104 01/23/2024 2326   CO2 25 01/23/2024 2326   GLUCOSE 109 (H) 01/23/2024 2326   BUN 10 01/23/2024 2326   BUN 10 11/19/2021 1035   CREATININE 0.56 01/23/2024 2326   CREATININE 0.55 01/16/2024 1020   CALCIUM 9.4 01/23/2024 2326   GFRNONAA >60 01/23/2024 2326   GFRNONAA 117 08/23/2020 1213   GFRAA 136 08/23/2020 1213    Lipid Panel  No results found for: "CHOL", "TRIG", "HDL", "CHOLHDL", "VLDL", "LDLCALC"  CBC    Component Value  Date/Time   WBC 4.4 01/23/2024 2326   RBC 4.34 01/23/2024 2326   HGB 13.2 01/23/2024 2326   HGB 12.1 11/18/2017 1613   HCT 39.1 01/23/2024 2326   HCT 37.3 11/18/2017 1613   PLT 243 01/23/2024 2326   PLT 425 (H) 11/18/2017 1613   MCV 90.1 01/23/2024 2326   MCV 80 11/18/2017 1613   MCH 30.4 01/23/2024 2326   MCHC 33.8 01/23/2024 2326   RDW 12.1 01/23/2024 2326   RDW 16.3 (H) 11/18/2017 1613   LYMPHSABS 2.7 01/07/2022 0853   LYMPHSABS 4.3 (H) 11/18/2017 1613   MONOABS 0.4 01/07/2022 0853   EOSABS 0.1 01/07/2022 0853   EOSABS 0.1 11/18/2017 1613   BASOSABS 0.1 01/07/2022 0853   BASOSABS 0.0 11/18/2017 1613    Hgb A1C Lab Results  Component Value Date   HGBA1C 5.7 (H) 11/19/2021       Assessment and Plan:  Assessment and Plan    Hyperthyroidism, Insomnia, Unintentional Weight Loss, Nausea and Vomiting Symptoms consistent with hyperthyroidism. Requires endocrinology referral for management beyond family practice scope. - Encourage contact with [facility] and [facility] to schedule an appointment. - Prescribe mirtazapine 15 mg for sleep and appetite stimulation, starting with half a tablet  for the first week, then increasing to a full tablet. - Refill Zofran 4 mg for nausea, to be taken every 8 hours.  Vaginal irritation with possible yeast infection or bacterial vaginosis (BV) Vaginal irritation likely due to yeast infection. Absence of malodor suggests yeast over BV. - Prescribe fluconazole tablet for possible yeast infection. - Advise to call back if symptoms persist for further evaluation for BV. - Inform that if symptoms do not improve, she can be seen by another provider for a wet prep.   Follow Up Instructions:    I discussed the assessment and treatment plan with the patient. The patient was provided an opportunity to ask questions and all were answered. The patient agreed with the plan and demonstrated an understanding of the instructions.   The patient was advised to call back or seek an in-person evaluation if the symptoms worsen or if the condition fails to improve as anticipated.   Nicki Reaper, NP

## 2024-02-03 DIAGNOSIS — E0590 Thyrotoxicosis, unspecified without thyrotoxic crisis or storm: Secondary | ICD-10-CM | POA: Diagnosis not present

## 2024-02-03 DIAGNOSIS — R7303 Prediabetes: Secondary | ICD-10-CM | POA: Diagnosis not present

## 2024-02-03 DIAGNOSIS — R631 Polydipsia: Secondary | ICD-10-CM | POA: Diagnosis not present

## 2024-02-04 DIAGNOSIS — F331 Major depressive disorder, recurrent, moderate: Secondary | ICD-10-CM | POA: Diagnosis not present

## 2024-02-04 DIAGNOSIS — F840 Autistic disorder: Secondary | ICD-10-CM | POA: Diagnosis not present

## 2024-02-04 DIAGNOSIS — F411 Generalized anxiety disorder: Secondary | ICD-10-CM | POA: Diagnosis not present

## 2024-02-10 ENCOUNTER — Encounter: Payer: Self-pay | Admitting: Internal Medicine

## 2024-02-11 DIAGNOSIS — F331 Major depressive disorder, recurrent, moderate: Secondary | ICD-10-CM | POA: Diagnosis not present

## 2024-02-11 DIAGNOSIS — F840 Autistic disorder: Secondary | ICD-10-CM | POA: Diagnosis not present

## 2024-02-11 DIAGNOSIS — F411 Generalized anxiety disorder: Secondary | ICD-10-CM | POA: Diagnosis not present

## 2024-02-12 ENCOUNTER — Encounter: Payer: Self-pay | Admitting: Internal Medicine

## 2024-02-12 ENCOUNTER — Telehealth: Admitting: Internal Medicine

## 2024-02-12 DIAGNOSIS — F419 Anxiety disorder, unspecified: Secondary | ICD-10-CM

## 2024-02-12 DIAGNOSIS — E0590 Thyrotoxicosis, unspecified without thyrotoxic crisis or storm: Secondary | ICD-10-CM

## 2024-02-12 DIAGNOSIS — Z0289 Encounter for other administrative examinations: Secondary | ICD-10-CM

## 2024-02-12 DIAGNOSIS — F418 Other specified anxiety disorders: Secondary | ICD-10-CM

## 2024-02-12 NOTE — Patient Instructions (Signed)
 Hyperthyroidism Hyperthyroidism refers to a thyroid gland that is too active or overactive. The thyroid gland is a small gland located in the lower front part of the neck, just in front of the windpipe (trachea). This gland makes hormones that: Help control how the body uses food for energy (metabolism). Help the heart and brain work well. Keep your bones strong. When the thyroid is overactive, it produces too much of a hormone called thyroxine. What are the causes? This condition may be caused by: Graves' disease. This is a disorder in which the body's disease-fighting system (immune system) attacks the thyroid gland. This is the most common cause. Inflammation of the thyroid gland. A tumor in the thyroid gland. Use of certain medicines, including: Prescription thyroid hormone replacement. Herbal supplements that mimic thyroid hormones. Amiodarone therapy. Solid or fluid-filled lumps within your thyroid gland (thyroid nodules). Taking in a large amount of iodine from foods or medicines. What increases the risk? You are more likely to develop this condition if: You are female. You have a family history of thyroid conditions. You smoke tobacco. You use a medicine called lithium. You take medicines that affect the immune system (immunosuppressants). What are the signs or symptoms? Symptoms of this condition include: Nervousness. Inability to tolerate heat. Diarrhea. Rapid heart rate. Shaky hands. Restlessness. Sleep problems. Other symptoms may include: Heart skipping beats or making extra beats. Unexplained weight loss. Change in the texture of hair or skin. Loss of menstruation. Fatigue. Enlarged thyroid gland or a lump in the thyroid (nodule). You may also have symptoms of Graves' disease, which may include: Protruding eyes. Dry eyes. Red or swollen eyes. Problems with vision. How is this diagnosed? This condition may be diagnosed based on: Your symptoms and medical  history. A physical exam. Blood tests. Thyroid ultrasound. This test involves using sound waves to produce images of the thyroid gland. A thyroid scan. A radioactive substance is injected into a vein, and images show how much iodine is present in the thyroid. Radioactive iodine uptake test (RAIU). A small amount of radioactive iodine is given by mouth to see how much iodine the thyroid absorbs after a certain amount of time. How is this treated? Treatment depends on the cause and severity of the condition. Treatment may include: Medicines to reduce the amount of thyroid hormone your body makes. Medicines to help manage your symptoms. Radioactive iodine treatment (radioiodine therapy). This involves swallowing a small dose of radioactive iodine, in capsule or liquid form, to kill thyroid cells. Surgery to remove part or all of your thyroid gland. You may need to take thyroid hormone replacement medicine for the rest of your life after thyroid surgery. Follow these instructions at home:  Take over-the-counter and prescription medicines only as told by your health care provider. Do not use any products that contain nicotine or tobacco. These products include cigarettes, chewing tobacco, and vaping devices, such as e-cigarettes. If you need help quitting, ask your health care provider. Follow any instructions from your health care provider about diet. You may be instructed to limit foods that contain iodine. Keep all follow-up visits. You will need to have blood tests regularly so that your health care provider can monitor your condition. Where to find more information [employer] of Diabetes and Digestive and Kidney Diseases: [email or web address] Contact a health care provider if: Your symptoms do not get better with treatment. You have a fever. You have abdominal pain. You feel dizzy. You are taking thyroid hormone replacement medicine and: You have  symptoms of depression. You feel like you  are tired all the time. You gain weight. Get help right away if: You have sudden, unexplained confusion or other mental changes. You have chest pain. You have fast or irregular heartbeats (palpitations). You have difficulty breathing. These symptoms may be an emergency. Get help right away. Call 911. Do not wait to see if the symptoms will go away. Do not drive yourself to the hospital. Summary The thyroid gland is a small gland located in the lower front part of the neck, just in front of the windpipe. Hyperthyroidism is when the thyroid gland is too active and produces too much of a hormone called thyroxine. The most common cause is Graves' disease, a disorder in which your immune system attacks the thyroid gland. Hyperthyroidism can cause various symptoms, such as unexplained weight loss, nervousness, inability to tolerate heat, or changes in your heartbeat. Treatment may include medicine to reduce the amount of thyroid hormone your body makes, radioiodine therapy, surgery, or medicines to manage symptoms. This information is not intended to replace advice given to you by your health care provider. Make sure you discuss any questions you have with your health care provider. Document Revised: 11/30/2021 Document Reviewed: 11/30/2021 Elsevier Patient Education  2024 [employer].

## 2024-02-12 NOTE — Progress Notes (Signed)
 Virtual Visit via Video Note  I connected with Crystal Mendez on 02/12/24 at  3:20 PM EDT by a video enabled telemedicine application and verified that I am speaking with the correct person using two identifiers.  Location: Patient: Home Provider: Office  Person's participating in this video call: Helayne Lo, NP-C and Tonja Fray   I discussed the limitations of evaluation and management by telemedicine and the availability of in person appointments. The patient expressed understanding and agreed to proceed.  History of Present Illness:   Discussed the use of AI scribe software for clinical note transcription with the patient, who gave verbal consent to proceed.  History of Present Illness   Crystal Mendez is a 38 year old female who presents for FMLA paperwork related to her medical appointments.  She has been recently diagnosed with hyperthyroidism and is under the care of an endocrinologist. She is currently taking methimazole 5 mg every other day. Her medical appointments, including endocrinology follow-ups, are scheduled  every 3 months.  She has a long-standing history of anxiety and depression, attending therapy sessions weekly on Wednesdays, which last about one to two hours. She has previously been on sertraline  for anxiety and depression management.  She is considering her medication regimen, particularly the use of mirtazapine , and is contemplating discontinuing it unless she experiences weight loss again. She is also concerned about the impact of her medications on her weight, especially in relation to her hyperthyroidism treatment.        Past Medical History:  Diagnosis Date   Abnormal Pap smear of cervix 05/01/2016   Anemia    Anxiety    Complication of anesthesia    ITCHING   GERD (gastroesophageal reflux disease)    NO MEDS   Hyperthyroidism    Pre-diabetes     Current Outpatient Medications  Medication Sig Dispense Refill    acetaminophen  (TYLENOL ) 500 MG tablet Take 2 tablets (1,000 mg total) by mouth 3 (three) times daily. 30 tablet 0   famotidine  (PEPCID ) 20 MG tablet Take 1 tablet (20 mg total) by mouth 2 (two) times daily for 14 days. 28 tablet 0   mirtazapine  (REMERON ) 15 MG tablet Take 0.5 tablets (7.5 mg total) by mouth at bedtime for 7 days, THEN 1 tablet (15 mg total) at bedtime. 90 tablet 0   omeprazole  (PRILOSEC) 20 MG capsule Take 1 capsule (20 mg total) by mouth daily. 90 capsule 0   ondansetron  (ZOFRAN -ODT) 4 MG disintegrating tablet Take 1 tablet (4 mg total) by mouth every 8 (eight) hours as needed for nausea or vomiting. 30 tablet 0   potassium chloride  (KLOR-CON  M) 10 MEQ tablet Take 1 tablet (10 mEq total) by mouth daily. 90 tablet 0   sertraline  (ZOLOFT ) 50 MG tablet Take 1 tablet (50 mg total) by mouth daily. 90 tablet 0   No current facility-administered medications for this visit.    Allergies  Allergen Reactions   Bee Venom Swelling   Egg-Derived Products Other (See Comments)    Breakout in sweats   Lactose Nausea And Vomiting    No dairy, cheese, yogurt    Family History  Problem Relation Age of Onset   Ataxia Mother        spinal cerebellar ataxia   Stroke Father        ischemic   Ataxia Sister    Ataxia Brother    Autism Son    Autism Son    Healthy Son    Developmental delay  Son     Social History   Socioeconomic History   Marital status: Single    Spouse name: Not on file   Number of children: Not on file   Years of education: Not on file   Highest education level: Not on file  Occupational History   Not on file  Tobacco Use   Smoking status: Never   Smokeless tobacco: Never  Vaping Use   Vaping status: Never Used  Substance and Sexual Activity   Alcohol use: Yes    Comment: occ   Drug use: No   Sexual activity: Yes    Birth control/protection: Surgical    Comment: Hysterectomy  Other Topics Concern   Not on file  Social History Narrative   Not on  file   Social Drivers of Health   Financial Resource Strain: Not on file  Food Insecurity: No Food Insecurity (07/11/2020)   [facility] prior to 12/21/2022., [facility] visits prior to 12/21/2022.   Hunger Vital Sign    Worried About Running Out of Food in the Last Year: Never true    Ran Out of Food in the Last Year: Never true  Transportation Needs: Not on file  Physical Activity: Not on file  Stress: Not on file  Social Connections: Not on file  Intimate Partner Violence: Not on file     Constitutional: Pt reports unintentional weight loss. Denies fever, malaise, fatigue, headache.  HEENT: Denies eye pain, eye redness, ear pain, ringing in the ears, wax buildup, runny nose, nasal congestion, bloody nose, or sore throat. Respiratory: Denies difficulty breathing, shortness of breath, cough or sputum production.   Cardiovascular: Denies chest pain, chest tightness, palpitations or swelling in the hands or feet.  Gastrointestinal: Pt reports nausea. Denies abdominal pain, bloating, constipation, diarrhea or blood in the stool.  GU: Pt reports vaginal itching. Denies urgency, frequency, pain with urination, burning sensation, blood in urine, odor or discharge. Musculoskeletal: Denies decrease in range of motion, difficulty with gait, muscle pain or joint pain and swelling.  Skin: Denies redness, rashes, lesions or ulcercations.  Neurological: Pt reports insomnia. Denies dizziness, difficulty with memory, difficulty with speech or problems with balance and coordination.  Psych: Pt has a history of anxiety and depression.  Denies SI/HI.  No other specific complaints in a complete review of systems (except as listed in HPI above).  Observations/Objective:   Wt Readings from Last 3 Encounters:  01/23/24 137 lb (62.1 kg)  01/16/24 138 lb (62.6 kg)  01/13/24 140 lb (63.5 kg)    General: Appears her stated age, well  developed, well nourished in NAD. Pulmonary/Chest: Normal effort. No respiratory distress.  Neurological: Alert and oriented.  Psychiatric: Mood and affect normal. Anxious appearing. Judgment and thought content normal.    BMET    Component Value Date/Time   NA 139 01/23/2024 2326   NA 138 11/19/2021 1035   K 3.0 (L) 01/23/2024 2326   CL 104 01/23/2024 2326   CO2 25 01/23/2024 2326   GLUCOSE 109 (H) 01/23/2024 2326   BUN 10 01/23/2024 2326   BUN 10 11/19/2021 1035   CREATININE 0.56 01/23/2024 2326   CREATININE 0.55 01/16/2024 1020   CALCIUM 9.4 01/23/2024 2326   GFRNONAA >60 01/23/2024 2326   GFRNONAA 117 08/23/2020 1213   GFRAA 136 08/23/2020 1213    Lipid Panel  No results found for: "CHOL", "TRIG", "HDL", "CHOLHDL", "VLDL", "LDLCALC"  CBC    Component  Value Date/Time   WBC 4.4 01/23/2024 2326   RBC 4.34 01/23/2024 2326   HGB 13.2 01/23/2024 2326   HGB 12.1 11/18/2017 1613   HCT 39.1 01/23/2024 2326   HCT 37.3 11/18/2017 1613   PLT 243 01/23/2024 2326   PLT 425 (H) 11/18/2017 1613   MCV 90.1 01/23/2024 2326   MCV 80 11/18/2017 1613   MCH 30.4 01/23/2024 2326   MCHC 33.8 01/23/2024 2326   RDW 12.1 01/23/2024 2326   RDW 16.3 (H) 11/18/2017 1613   LYMPHSABS 2.7 01/07/2022 0853   LYMPHSABS 4.3 (H) 11/18/2017 1613   MONOABS 0.4 01/07/2022 0853   EOSABS 0.1 01/07/2022 0853   EOSABS 0.1 11/18/2017 1613   BASOSABS 0.1 01/07/2022 0853   BASOSABS 0.0 11/18/2017 1613    Hgb A1C Lab Results  Component Value Date   HGBA1C 5.7 (H) 11/19/2021       Assessment and Plan:  Assessment and Plan    Hyperthyroidism Recently diagnosed, managed with methimazole. Considering discontinuing mirtazapine  due to weight gain but continuing for now. Endocrinology follow-up in three months. - Continue methimazole 5 mg every other day. - Endocrinology follow-up in three months.  Anxiety Long-standing anxiety managed with weekly therapy. FMLA forms completed for spouse  assistance. - Continue weekly therapy. - FMLA forms completed for spouse.       Schedule an appointment for your annual exam  Follow Up Instructions:    I discussed the assessment and treatment plan with the patient. The patient was provided an opportunity to ask questions and all were answered. The patient agreed with the plan and demonstrated an understanding of the instructions.   The patient was advised to call back or seek an in-person evaluation if the symptoms worsen or if the condition fails to improve as anticipated.   Helayne Lo, NP

## 2024-02-18 ENCOUNTER — Other Ambulatory Visit: Payer: Self-pay | Admitting: Internal Medicine

## 2024-02-18 DIAGNOSIS — F840 Autistic disorder: Secondary | ICD-10-CM | POA: Diagnosis not present

## 2024-02-18 DIAGNOSIS — F411 Generalized anxiety disorder: Secondary | ICD-10-CM | POA: Diagnosis not present

## 2024-02-18 DIAGNOSIS — F331 Major depressive disorder, recurrent, moderate: Secondary | ICD-10-CM | POA: Diagnosis not present

## 2024-02-21 NOTE — Telephone Encounter (Signed)
 Too soon for refill. Requested Prescriptions  Pending Prescriptions Disp Refills   sertraline  (ZOLOFT ) 50 MG tablet [Pharmacy Med Name: SERTRALINE  HCL 50 MG TABLET] 90 tablet 0    Sig: TAKE 1 TABLET BY MOUTH EVERY DAY     Psychiatry:  Antidepressants - SSRI - sertraline  Failed - 02/21/2024 11:33 AM      Failed - AST in normal range and within 360 days    AST  Date Value Ref Range Status  01/23/2024 43 (H) 15 - 41 U/L Final         Failed - Valid encounter within last 6 months    Recent Outpatient Visits           1 week ago Hyperthyroidism   [place] [facility] [place], [place] W, NP   3 weeks ago Nausea and vomiting, unspecified vomiting type   [place] [facility] [place], [place] Buzzard, NP   1 month ago Abnormal TSH   [place] [facility] [place], [place] W, NP              Passed - ALT in normal range and within 360 days    ALT  Date Value Ref Range Status  01/23/2024 29 0 - 44 U/L Final         Passed - Completed PHQ-2 or PHQ-9 in the last 360 days

## 2024-02-25 DIAGNOSIS — F331 Major depressive disorder, recurrent, moderate: Secondary | ICD-10-CM | POA: Diagnosis not present

## 2024-02-25 DIAGNOSIS — F840 Autistic disorder: Secondary | ICD-10-CM | POA: Diagnosis not present

## 2024-02-25 DIAGNOSIS — F411 Generalized anxiety disorder: Secondary | ICD-10-CM | POA: Diagnosis not present

## 2024-03-03 DIAGNOSIS — F331 Major depressive disorder, recurrent, moderate: Secondary | ICD-10-CM | POA: Diagnosis not present

## 2024-03-03 DIAGNOSIS — F840 Autistic disorder: Secondary | ICD-10-CM | POA: Diagnosis not present

## 2024-03-03 DIAGNOSIS — F411 Generalized anxiety disorder: Secondary | ICD-10-CM | POA: Diagnosis not present

## 2024-03-04 ENCOUNTER — Telehealth: Payer: Self-pay

## 2024-03-04 NOTE — Telephone Encounter (Signed)
 Patient came by the office to do a self swab. It was during lunch hour, this did not get completed.   I spoke with the patient and explained she couldn't just drop by to this. She would need an appointment either with [facility] or Lab and orders would have to be in. I verified there are no orders in the computer at this time for a swab test.Explained this to the patient; she stated yes I have orders in. I rechecked no orders are in. She then hung up the phone.

## 2024-03-09 NOTE — Telephone Encounter (Signed)
 When I talked to her a month ago, she was having some vaginal itching at that time but she reported that she could not come to the office to do the swab due to lack of transportation.  If she is having issues, she will need to schedule an appointment to discuss further and order appropriate testing.

## 2024-03-09 NOTE — Telephone Encounter (Signed)
 Spoke with patient, notified her that Leward Record will need to see her if she is having further complications. Offered to schedule an appointment patient declined at this time to do so.

## 2024-03-17 DIAGNOSIS — F411 Generalized anxiety disorder: Secondary | ICD-10-CM | POA: Diagnosis not present

## 2024-03-17 DIAGNOSIS — F840 Autistic disorder: Secondary | ICD-10-CM | POA: Diagnosis not present

## 2024-03-18 ENCOUNTER — Other Ambulatory Visit: Payer: Self-pay | Admitting: Internal Medicine

## 2024-03-20 NOTE — Telephone Encounter (Signed)
 Requested Prescriptions  Pending Prescriptions Disp Refills   mirtazapine  (REMERON ) 15 MG tablet [Pharmacy Med Name: MIRTAZAPINE  15 MG TABLET] 90 tablet 0    Sig: TAKE 0.5 TABLETS BY MOUTH AT BEDTIME FOR 7 DAYS, THEN 1 TABLET AT BEDTIME.     Psychiatry: Antidepressants - mirtazapine  Failed - 03/20/2024  2:19 PM      Failed - Valid encounter within last 6 months    Recent Outpatient Visits           1 month ago Hyperthyroidism   [place] [facility] [place], [place] W, NP   1 month ago Nausea and vomiting, unspecified vomiting type   [place] [facility] [place], [place] Buzzard, NP   2 months ago Abnormal TSH   [place] [facility] [place], [place], [place]              Passed - Completed PHQ-2 or PHQ-9 in the last 360 days

## 2024-03-23 NOTE — Progress Notes (Unsigned)
 PCP:  Carollynn Cirri, NP   No chief complaint on file.    HPI:      [name] is a 38 y.o. [phone] whose LMP was No LMP recorded. Patient has had a hysterectomy., presents today for her annual examination.  Her menses are absent due to hyst for AUB, still has ovaries???  Sex activity: {sex active: 315163}.  Last Pap: 02/24/20 Results were: no abnormalities /neg HPV DNA  Hx of STDs: {STD hx:14358}  There is no FH of breast cancer. There is no FH of ovarian cancer. The patient {does:18564} do self-breast exams.  Tobacco use: {tob:20664} Alcohol use: {Alcohol:11675} No drug use.  Exercise: {exercise:31265}  She {does:18564} get adequate calcium and Vitamin D  in her diet.  Patient Active Problem List   Diagnosis Date Noted   Hyperthyroidism 01/30/2024   Class 1 obesity due to excess calories with body mass index (BMI) of 30.0 to 30.9 in adult 03/25/2022   Depression with anxiety 01/17/2022   Iron deficiency anemia 09/05/2016   Gastroesophageal reflux disease without esophagitis 08/05/2016   Prediabetes 08/05/2016    Past Surgical History:  Procedure Laterality Date   CESAREAN SECTION     X 4   CHOLECYSTECTOMY     COLPOSCOPY  06/18/2016   CYSTOSCOPY N/A 04/15/2017   Procedure: CYSTOSCOPY;  Surgeon: Alben Alma, MD;  Location: [facility];  Service: Gynecology;  Laterality: N/A;   LAPAROSCOPIC HYSTERECTOMY N/A 04/15/2017   Ovaries remain - Procedure: HYSTERECTOMY TOTAL LAPAROSCOPIC;  Surgeon: Alben Alma, MD;  Location: [facility];  Service: Gynecology;  Laterality: N/A;   PANNICULECTOMY     ROUX-EN-Y GASTRIC BYPASS     TONSILLECTOMY     TUBAL LIGATION     TUBAL LIGATION     UPPER GASTROINTESTINAL ENDOSCOPY      Family History  Problem Relation Age of Onset   Ataxia Mother        spinal cerebellar ataxia   Stroke Father        ischemic   Ataxia Sister    Ataxia Brother    Autism Son    Autism Son    Healthy Son    Developmental delay Son      Social History   Socioeconomic History   Marital status: Single    Spouse name: Not on file   Number of children: Not on file   Years of education: Not on file   Highest education level: Not on file  Occupational History   Not on file  Tobacco Use   Smoking status: Never   Smokeless tobacco: Never  Vaping Use   Vaping status: Never Used  Substance and Sexual Activity   Alcohol use: Yes    Comment: occ   Drug use: No   Sexual activity: Yes    Birth control/protection: Surgical    Comment: Hysterectomy  Other Topics Concern   Not on file  Social History Narrative   Not on file   Social Drivers of Health   Financial Resource Strain: Not on file  Food Insecurity: No Food Insecurity (07/11/2020)   [facility] from [facility] prior to 12/21/2022., [facility] visits prior to 12/21/2022.   Hunger Vital Sign    Worried About Running Out of Food in the Last Year: Never true    Ran Out of Food in the Last Year: Never true  Transportation Needs: Not on file  Physical Activity: Not on file  Stress: Not on file  Social Connections: Not on file  Intimate Partner Violence: Not on file     Current Outpatient Medications:    acetaminophen  (TYLENOL ) 500 MG tablet, Take 2 tablets (1,000 mg total) by mouth 3 (three) times daily., Disp: 30 tablet, Rfl: 0   famotidine  (PEPCID ) 20 MG tablet, Take 1 tablet (20 mg total) by mouth 2 (two) times daily for 14 days., Disp: 28 tablet, Rfl: 0   mirtazapine  (REMERON ) 15 MG tablet, TAKE 0.5 TABLETS BY MOUTH AT BEDTIME FOR 7 DAYS, THEN 1 TABLET AT BEDTIME., Disp: 90 tablet, Rfl: 0   omeprazole  (PRILOSEC) 20 MG capsule, Take 1 capsule (20 mg total) by mouth daily., Disp: 90 capsule, Rfl: 0   ondansetron  (ZOFRAN -ODT) 4 MG disintegrating tablet, Take 1 tablet (4 mg total) by mouth every 8 (eight) hours as needed for nausea or vomiting., Disp: 30 tablet, Rfl: 0   potassium chloride  (KLOR-CON  M) 10 MEQ  tablet, Take 1 tablet (10 mEq total) by mouth daily., Disp: 90 tablet, Rfl: 0   sertraline  (ZOLOFT ) 50 MG tablet, Take 1 tablet (50 mg total) by mouth daily., Disp: 90 tablet, Rfl: 0     ROS:  Review of Systems BREAST: No symptoms   Objective: There were no vitals taken for this visit.   OBGyn Exam  Results: No results found for this or any previous visit (from the past 24 hours).  Assessment/Plan: No diagnosis found.  No orders of the defined types were placed in this encounter.            GYN counsel {counseling: 16159}     F/U  No follow-ups on file.  Quintara Bost B. Flora Parks, PA-C 03/23/2024 1:04 PM

## 2024-03-25 ENCOUNTER — Encounter: Payer: Self-pay | Admitting: Obstetrics and Gynecology

## 2024-03-25 ENCOUNTER — Ambulatory Visit (INDEPENDENT_AMBULATORY_CARE_PROVIDER_SITE_OTHER): Payer: MEDICAID | Admitting: Obstetrics and Gynecology

## 2024-03-25 VITALS — BP 116/87 | HR 170 | Ht 66.0 in | Wt 142.6 lb

## 2024-03-25 DIAGNOSIS — Z01419 Encounter for gynecological examination (general) (routine) without abnormal findings: Secondary | ICD-10-CM

## 2024-03-25 DIAGNOSIS — N898 Other specified noninflammatory disorders of vagina: Secondary | ICD-10-CM

## 2024-03-25 DIAGNOSIS — L680 Hirsutism: Secondary | ICD-10-CM

## 2024-03-25 DIAGNOSIS — Z01411 Encounter for gynecological examination (general) (routine) with abnormal findings: Secondary | ICD-10-CM | POA: Diagnosis not present

## 2024-03-25 LAB — POCT WET PREP WITH KOH
Clue Cells Wet Prep HPF POC: NEGATIVE
KOH Prep POC: NEGATIVE
Trichomonas, UA: NEGATIVE
Yeast Wet Prep HPF POC: NEGATIVE

## 2024-03-25 NOTE — Patient Instructions (Signed)
 I value your feedback and you entrusting [place] with your care. If you get a [place] patient survey, I would appreciate you taking the time to let us know about your experience today. Thank you! ? ? ?

## 2024-03-31 ENCOUNTER — Other Ambulatory Visit: Payer: Self-pay | Admitting: Internal Medicine

## 2024-04-02 NOTE — Telephone Encounter (Signed)
 Requested medication (s) are due for refill today: yes  Requested medication (s) are on the active medication list: yes  Last refill:  01/30/24 #30  Future visit scheduled: yes  Notes to clinic:  med not delegated to NT to RF   Requested Prescriptions  Pending Prescriptions Disp Refills   ondansetron  (ZOFRAN -ODT) 4 MG disintegrating tablet [Pharmacy Med Name: ONDANSETRON  ODT 4 MG TABLET] 20 tablet     Sig: TAKE 1 TABLET BY MOUTH EVERY 8 HOURS AS NEEDED FOR NAUSEA AND VOMITING     Not Delegated - Gastroenterology: Antiemetics - ondansetron  Failed - 04/02/2024 12:04 PM      Failed - This refill cannot be delegated      Failed - AST in normal range and within 360 days    AST  Date Value Ref Range Status  01/23/2024 43 (H) 15 - 41 U/L Final         Failed - Valid encounter within last 6 months    Recent Outpatient Visits           1 month ago Hyperthyroidism   [facility], Rankin Buzzard, NP   2 months ago Nausea and vomiting, unspecified vomiting type   [facility] [place], Rankin Buzzard, NP   2 months ago Abnormal TSH    [facility] [place], Rankin Buzzard, NP       Future Appointments             In 1 month Abram Hoguet, Monalisa Angles, FNP [facility]            Passed - ALT in normal range and within 360 days    ALT  Date Value Ref Range Status  01/23/2024 29 0 - 44 U/L Final

## 2024-04-11 ENCOUNTER — Encounter: Payer: Self-pay | Admitting: Emergency Medicine

## 2024-04-11 ENCOUNTER — Emergency Department
Admission: EM | Admit: 2024-04-11 | Discharge: 2024-04-11 | Disposition: A | Discharge status: Home / Self Care | Payer: MEDICAID | Attending: Emergency Medicine | Admitting: Emergency Medicine

## 2024-04-11 ENCOUNTER — Emergency Department: Payer: MEDICAID

## 2024-04-11 ENCOUNTER — Other Ambulatory Visit: Payer: Self-pay

## 2024-04-11 DIAGNOSIS — M25512 Pain in left shoulder: Secondary | ICD-10-CM | POA: Diagnosis not present

## 2024-04-11 DIAGNOSIS — S161XXA Strain of muscle, fascia and tendon at neck level, initial encounter: Secondary | ICD-10-CM | POA: Diagnosis not present

## 2024-04-11 DIAGNOSIS — Y92410 Unspecified street and highway as the place of occurrence of the external cause: Secondary | ICD-10-CM | POA: Diagnosis not present

## 2024-04-11 DIAGNOSIS — R519 Headache, unspecified: Secondary | ICD-10-CM | POA: Diagnosis not present

## 2024-04-11 DIAGNOSIS — M542 Cervicalgia: Secondary | ICD-10-CM | POA: Diagnosis present

## 2024-04-11 MED ORDER — CYCLOBENZAPRINE HCL 5 MG PO TABS: 5.0000 mg | ORAL_TABLET | Freq: Three times a day (TID) | ORAL | 0 refills | Status: AC | PRN

## 2024-04-11 MED ORDER — OXYCODONE-ACETAMINOPHEN 5-325 MG PO TABS: 1.0000 | ORAL_TABLET | ORAL | 0 refills | Status: AC | PRN

## 2024-04-11 MED ORDER — OXYCODONE HCL 5 MG PO TABS
5.0000 mg | ORAL_TABLET | Freq: Once | ORAL | Status: AC
  Administered 2024-04-11: 5 mg via ORAL
  Filled 2024-04-11: qty 1

## 2024-04-11 MED ORDER — ACETAMINOPHEN 500 MG PO TABS
1000.0000 mg | ORAL_TABLET | Freq: Once | ORAL | Status: AC
  Administered 2024-04-11: 1000 mg via ORAL
  Filled 2024-04-11: qty 2

## 2024-04-11 MED ORDER — KETOROLAC TROMETHAMINE 30 MG/ML IJ SOLN
30.0000 mg | Freq: Once | INTRAMUSCULAR | Status: AC
  Administered 2024-04-11: 30 mg via INTRAMUSCULAR
  Filled 2024-04-11: qty 1

## 2024-04-11 MED ORDER — LIDOCAINE 5 % EX PTCH
1.0000 | MEDICATED_PATCH | Freq: Once | CUTANEOUS | Status: DC
  Administered 2024-04-11: 1 via TRANSDERMAL
  Filled 2024-04-11: qty 1

## 2024-04-11 NOTE — ED Notes (Signed)
 Patient transported to CT

## 2024-04-11 NOTE — ED Notes (Signed)
 EDP, Mumma at bedside to reassess.
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
## 2024-04-12 ENCOUNTER — Other Ambulatory Visit: Payer: Self-pay | Admitting: Internal Medicine

## 2024-04-13 NOTE — Telephone Encounter (Signed)
 Requested Prescriptions  Pending Prescriptions Disp Refills   omeprazole  (PRILOSEC) 20 MG capsule [Pharmacy Med Name: OMEPRAZOLE  DR 20 MG CAPSULE] 90 capsule 0    Sig: TAKE 1 CAPSULE BY MOUTH EVERY DAY     Gastroenterology: Proton Pump Inhibitors Failed - 04/13/2024  3:07 PM      Failed - Valid encounter within last 12 months    Recent Outpatient Visits           2 months ago Hyperthyroidism   [facility] [place], [place] W, NP   2 months ago Nausea and vomiting, unspecified vomiting type   [facility] [place], Angeline ORN, NP   2 months ago Abnormal TSH   [facility] [place], Angeline ORN, NP       Future Appointments             In 1 month Gareth, Mliss FALCON, FNP [facility]

## 2024-04-14 ENCOUNTER — Ambulatory Visit: Payer: Self-pay
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
## 2024-04-14 NOTE — Telephone Encounter (Signed)
Will discuss at upcoming appointment tomorrow

## 2024-04-15 ENCOUNTER — Inpatient Hospital Stay: Payer: MEDICAID | Admitting: Internal Medicine

## 2024-04-15 NOTE — Progress Notes (Deleted)
 Subjective:    Patient ID: Crystal Mendez, female    DOB: [birth year], 38 y.o.   MRN: [id number]  HPI    Review of Systems   Past Medical History:  Diagnosis Date   Abnormal Pap smear of cervix 05/01/2016   Anemia    Anxiety    Complication of anesthesia    ITCHING   GERD (gastroesophageal reflux disease)    NO MEDS   Hyperthyroidism    Pre-diabetes     Current Outpatient Medications  Medication Sig Dispense Refill   acetaminophen  (TYLENOL ) 500 MG tablet Take 2 tablets (1,000 mg total) by mouth 3 (three) times daily. 30 tablet 0   busPIRone (BUSPAR) 15 MG tablet Take 15 mg by mouth 2 (two) times daily.     cyclobenzaprine (FLEXERIL) 5 MG tablet Take 1 tablet (5 mg total) by mouth 3 (three) times daily as needed for muscle spasms. 15 tablet 0   famotidine  (PEPCID ) 20 MG tablet Take 1 tablet (20 mg total) by mouth 2 (two) times daily for 14 days. 28 tablet 0   HYDROcodone -acetaminophen  (NORCO) 10-325 MG tablet Take 1 tablet by mouth at bedtime as needed.     hydrOXYzine  (ATARAX ) 10 MG tablet Take 10 mg by mouth.     meloxicam (MOBIC) 15 MG tablet Take 1 tablet every day by oral route.     methimazole (TAPAZOLE) 5 MG tablet Take 5 mg by mouth.     mirtazapine  (REMERON ) 15 MG tablet TAKE 0.5 TABLETS BY MOUTH AT BEDTIME FOR 7 DAYS, THEN 1 TABLET AT BEDTIME. 90 tablet 0   omeprazole  (PRILOSEC) 20 MG capsule TAKE 1 CAPSULE BY MOUTH EVERY DAY 90 capsule 0   ondansetron  (ZOFRAN -ODT) 4 MG disintegrating tablet TAKE 1 TABLET BY MOUTH EVERY 8 HOURS AS NEEDED FOR NAUSEA AND VOMITING 20 tablet 0   potassium chloride  (KLOR-CON  M) 10 MEQ tablet Take 1 tablet (10 mEq total) by mouth daily. 90 tablet 0   sertraline  (ZOLOFT ) 50 MG tablet Take 1 tablet (50 mg total) by mouth daily. 90 tablet 0   No current facility-administered medications for this visit.    Allergies  Allergen Reactions   Bee Venom Swelling   Egg-Derived Products Other (See Comments)    Breakout in sweats    Lactose Nausea And Vomiting    No dairy, cheese, yogurt    Family History  Problem Relation Age of Onset   Ataxia Mother        spinal cerebellar ataxia   Stroke Father        ischemic   Ataxia Sister    Ataxia Brother    Autism Son    Autism Son    Healthy Son    Developmental delay Son    Breast cancer Paternal Aunt        ? age of dx    Social History   Socioeconomic History   Marital status: Single    Spouse name: Not on file   Number of children: Not on file   Years of education: Not on file   Highest education level: Not on file  Occupational History   Not on file  Tobacco Use   Smoking status: Never   Smokeless tobacco: Never  Vaping Use   Vaping status: Never Used  Substance and Sexual Activity   Alcohol use: Yes   Drug use: No   Sexual activity: Yes    Birth control/protection: Surgical    Comment: Hysterectomy  Other Topics Concern  Not on file  Social History Narrative   Not on file   Social Drivers of Health   Financial Resource Strain: Not on file  Food Insecurity: No Food Insecurity (07/11/2020)   Received from [facility] visits prior to 12/21/2022.   Hunger Vital Sign    Worried About Running Out of Food in the Last Year: Never true    Ran Out of Food in the Last Year: Never true  Transportation Needs: Not on file  Physical Activity: Not on file  Stress: Not on file  Social Connections: Not on file  Intimate Partner Violence: Not on file     Constitutional: Pt reports fatigue. Denies fever, malaise, headache or abrupt weight changes.  HEENT: Denies eye pain, eye redness, ear pain, ringing in the ears, wax buildup, runny nose, nasal congestion, bloody nose, or sore throat. Respiratory: Pt reports cough and shortness of breath. Denies difficulty breathing, or sputum production.   Cardiovascular: Pt reports chest tightness, palpitations. Denies chest pain, or swelling in the hands or feet.  Gastrointestinal: Pt reports  nausea, vomiting and constipation. Denies abdominal pain, bloating,  diarrhea or blood in the stool.  GU: Denies urgency, frequency, pain with urination, burning sensation, blood in urine, odor or discharge. Musculoskeletal: Pt reports generalized weakness. Denies decrease in range of motion, difficulty with gait, muscle pain or joint pain and swelling.  Skin: Denies redness, rashes, lesions or ulcercations.  Neurological: Denies dizziness, difficulty with memory, difficulty with speech or problems with balance and coordination.  Psych: Pt reports anxiety and depression. Denies SI/HI.  No other specific complaints in a complete review of systems (except as listed in HPI above).      Objective:   Physical Exam  There were no vitals taken for this visit.  Wt Readings from Last 3 Encounters:  04/11/24 138 lb (62.6 kg)  03/25/24 142 lb 9.6 oz (64.7 kg)  01/23/24 137 lb (62.1 kg)    General: Appears her stated age, well developed, well nourished in NAD. Skin: Warm, dry and intact.  Bruising noted to bilateral antecubital fossa's. HEENT: Head: normal shape and size; Eyes: sclera white, no icterus, conjunctiva pink, PERRLA and EOMs intact;  Neck:  Neck supple, trachea midline. No masses, lumps or thyromegaly present.  Cardiovascular: Normal rate and rhythm. S1,S2 noted.  No murmur, rubs or gallops noted. No JVD or BLE edema.  Pulmonary/Chest: Normal effort and positive vesicular breath sounds. No respiratory distress. No wheezes, rales or ronchi noted.  Abdomen: Normal bowel sounds.  Musculoskeletal:  No difficulty with gait.  Neurological: Alert and oriented.  Coordination normal.  Psychiatric: Mood and affect flat. Behavior is normal. Judgment and thought content normal.    BMET    Component Value Date/Time   NA 139 01/23/2024 2326   NA 138 11/19/2021 1035   K 3.0 (L) 01/23/2024 2326   CL 104 01/23/2024 2326   CO2 25 01/23/2024 2326   GLUCOSE 109 (H) 01/23/2024 2326   BUN 10  01/23/2024 2326   BUN 10 11/19/2021 1035   CREATININE 0.56 01/23/2024 2326   CREATININE 0.55 01/16/2024 1020   CALCIUM 9.4 01/23/2024 2326   GFRNONAA >60 01/23/2024 2326   GFRNONAA 117 08/23/2020 1213   GFRAA 136 08/23/2020 1213    Lipid Panel  No results found for: CHOL, TRIG, HDL, CHOLHDL, VLDL, LDLCALC  CBC    Component Value Date/Time   WBC 4.4 01/23/2024 2326   RBC 4.34 01/23/2024 2326   HGB 13.2 01/23/2024  2326   HGB 12.1 11/18/2017 1613   HCT 39.1 01/23/2024 2326   HCT 37.3 11/18/2017 1613   PLT 243 01/23/2024 2326   PLT 425 (H) 11/18/2017 1613   MCV 90.1 01/23/2024 2326   MCV 80 11/18/2017 1613   MCH 30.4 01/23/2024 2326   MCHC 33.8 01/23/2024 2326   RDW 12.1 01/23/2024 2326   RDW 16.3 (H) 11/18/2017 1613   LYMPHSABS 2.7 01/07/2022 0853   LYMPHSABS 4.3 (H) 11/18/2017 1613   MONOABS 0.4 01/07/2022 0853   EOSABS 0.1 01/07/2022 0853   EOSABS 0.1 11/18/2017 1613   BASOSABS 0.1 01/07/2022 0853   BASOSABS 0.0 11/18/2017 1613    Hgb A1C Lab Results  Component Value Date   HGBA1C 5.7 (H) 11/19/2021            Assessment & Plan:     Schedule an appointment for your annual exam Angeline Laura, NP

## 2024-05-24 ENCOUNTER — Ambulatory Visit: Payer: MEDICAID | Admitting: Nurse Practitioner

## 2024-05-24 NOTE — Progress Notes (Deleted)
   There were no vitals taken for this visit.   Subjective:    Patient ID: Crystal Mendez, female    DOB: 02/10/1986, 38 y.o.   MRN: [id number]  HPI: Crystal Mendez is a 38 y.o. female  No chief complaint on file.   Discussed the use of AI scribe software for clinical note transcription with the patient, who gave verbal consent to proceed.  History of Present Illness          01/16/2024   10:41 AM 08/12/2022   10:50 AM 01/17/2022    9:25 AM  Depression screen PHQ 2/9  Decreased Interest 3 3 3   Down, Depressed, Hopeless 2 3 3   PHQ - 2 Score 5 6 6   Altered sleeping 2 3 3   Tired, decreased energy 3 3 3   Change in appetite 3 2 3   Feeling bad or failure about yourself  1 3 3   Trouble concentrating 2 3 3   Moving slowly or fidgety/restless 1 0 3  Suicidal thoughts 0 0 0  PHQ-9 Score 17 20 24   Difficult doing work/chores Somewhat difficult Extremely dIfficult Extremely dIfficult    Relevant past medical, surgical, family and social history reviewed and updated as indicated. Interim medical history since our last visit reviewed. Allergies and medications reviewed and updated.  Review of Systems  Per HPI unless specifically indicated above     Objective:     There were no vitals taken for this visit.  {Vitals History (Optional):23777} Wt Readings from Last 3 Encounters:  04/11/24 138 lb (62.6 kg)  03/25/24 142 lb 9.6 oz (64.7 kg)  01/23/24 137 lb (62.1 kg)    Physical Exam Physical Exam    Results for orders placed or performed in visit on 03/25/24  POCT Wet Prep with KOH   Collection Time: 03/25/24 12:15 PM  Result Value Ref Range   Trichomonas, UA Negative    Clue Cells Wet Prep HPF POC neg    Epithelial Wet Prep HPF POC     Yeast Wet Prep HPF POC neg    Bacteria Wet Prep HPF POC     RBC Wet Prep HPF POC     WBC Wet Prep HPF POC     KOH Prep POC Negative Negative   {Labs (Optional):23779}       Assessment & Plan:   Problem List Items  Addressed This Visit   None    Assessment and Plan Assessment & Plan         Follow up plan: No follow-ups on file.

## 2024-07-10 ENCOUNTER — Other Ambulatory Visit: Payer: Self-pay | Admitting: Nurse Practitioner

## 2024-07-12 ENCOUNTER — Other Ambulatory Visit: Payer: Self-pay | Admitting: Internal Medicine

## 2024-07-12 NOTE — Telephone Encounter (Signed)
 Requested Prescriptions  Pending Prescriptions Disp Refills   omeprazole  (PRILOSEC) 20 MG capsule [Pharmacy Med Name: OMEPRAZOLE  DR 20 MG CAPSULE] 90 capsule 1    Sig: TAKE 1 CAPSULE BY MOUTH EVERY DAY     Gastroenterology: Proton Pump Inhibitors Passed - 07/12/2024  2:35 PM      Passed - Valid encounter within last 12 months    Recent Outpatient Visits           5 months ago Hyperthyroidism   [place] [facility] [address], [place] W, NP   5 months ago Nausea and vomiting, unspecified vomiting type   [facility] [place], Angeline ORN, NP   5 months ago Abnormal TSH   [facility] [place], Angeline ORN, [place]

## 2024-07-13 NOTE — Telephone Encounter (Signed)
 Requested by interface [email or web address]. Receipt confirmed by pharmacy 07/12/24 at 2:36 pm. Duplicate request.  Requested Prescriptions  Refused Prescriptions Disp Refills   omeprazole  (PRILOSEC) 20 MG capsule [Pharmacy Med Name: OMEPRAZOLE  DR 20 MG CAPSULE] 90 capsule 1    Sig: TAKE 1 CAPSULE BY MOUTH EVERY DAY     Gastroenterology: Proton Pump Inhibitors Passed - 07/13/2024  2:26 PM      Passed - Valid encounter within last 12 months    Recent Outpatient Visits           5 months ago Hyperthyroidism   [place] [facility] [place], [place] W, NP   5 months ago Nausea and vomiting, unspecified vomiting type   [facility] [place], Angeline ORN, NP   5 months ago Abnormal TSH   [facility] [place], Angeline ORN, [place]

## 2024-07-19 ENCOUNTER — Other Ambulatory Visit: Payer: Self-pay | Admitting: Internal Medicine

## 2024-07-20 NOTE — Telephone Encounter (Signed)
 Requested medications are due for refill today.  unsure  Requested medications are on the active medications list.  no  Last refill. 01/30/2024  Future visit scheduled.   no  Notes to clinic.  Medication not assigned to a protocol . Please review for refill.    Requested Prescriptions  Pending Prescriptions Disp Refills   fluconazole  (DIFLUCAN ) 150 MG tablet [Pharmacy Med Name: FLUCONAZOLE  150 MG TABLET] 1 tablet 0    Sig: Take 1 tablet (150 mg total) by mouth once for 1 dose.     Off-Protocol Failed - 07/20/2024  5:17 PM      Failed - Medication not assigned to a protocol, review manually.      Passed - Valid encounter within last 12 months    Recent Outpatient Visits           5 months ago Hyperthyroidism   [place] [facility] [place], [place], NP   5 months ago Nausea and vomiting, unspecified vomiting type   [facility] [place], Angeline ORN, NP   6 months ago Abnormal TSH   [facility] [place], Angeline ORN, [place]

## 2024-08-31 ENCOUNTER — Other Ambulatory Visit: Payer: Self-pay | Admitting: Internal Medicine

## 2024-09-02 NOTE — Telephone Encounter (Signed)
 Requested medications are due for refill today.  yes  Requested medications are on the active medications list.  yes  Last refill. 04/02/2024 #20 0 rf  Future visit scheduled.   no  Notes to clinic.  Refill not delegated.    Requested Prescriptions  Pending Prescriptions Disp Refills   ondansetron  (ZOFRAN -ODT) 4 MG disintegrating tablet [Pharmacy Med Name: ONDANSETRON  ODT 4 MG TABLET] 20 tablet 0    Sig: TAKE 1 TABLET BY MOUTH EVERY 8 HOURS AS NEEDED FOR NAUSEA AND VOMITING     Not Delegated - Gastroenterology: Antiemetics - ondansetron  Failed - 09/02/2024 10:49 AM      Failed - This refill cannot be delegated      Failed - AST in normal range and within 360 days    AST  Date Value Ref Range Status  01/23/2024 43 (H) 15 - 41 U/L Final         Failed - Valid encounter within last 6 months    Recent Outpatient Visits           6 months ago Hyperthyroidism   [place] [facility] [place], [place], NP   7 months ago Nausea and vomiting, unspecified vomiting type   [place] [facility] [place], Angeline ORN, NP   7 months ago Abnormal TSH   [place] [facility] [place], [place] W, NP              Passed - ALT in normal range and within 360 days    ALT  Date Value Ref Range Status  01/23/2024 29 0 - 44 U/L Final
